# Patient Record
Sex: Female | Born: 1960 | Race: Black or African American | Hispanic: No | Marital: Married | Smoking: Former smoker
Health system: Southern US, Community
[De-identification: ages and names within clinical notes are randomized; demographics above are authoritative.]

## PROBLEM LIST (undated history)

## (undated) DIAGNOSIS — I10 Essential (primary) hypertension: Secondary | ICD-10-CM

## (undated) DIAGNOSIS — Z923 Personal history of irradiation: Secondary | ICD-10-CM

## (undated) DIAGNOSIS — Z860100 Personal history of colon polyps, unspecified: Secondary | ICD-10-CM

## (undated) DIAGNOSIS — E785 Hyperlipidemia, unspecified: Secondary | ICD-10-CM

## (undated) DIAGNOSIS — F1020 Alcohol dependence, uncomplicated: Secondary | ICD-10-CM

## (undated) DIAGNOSIS — M1990 Unspecified osteoarthritis, unspecified site: Secondary | ICD-10-CM

## (undated) DIAGNOSIS — H269 Unspecified cataract: Secondary | ICD-10-CM

## (undated) DIAGNOSIS — C801 Malignant (primary) neoplasm, unspecified: Secondary | ICD-10-CM

## (undated) DIAGNOSIS — K219 Gastro-esophageal reflux disease without esophagitis: Secondary | ICD-10-CM

## (undated) DIAGNOSIS — K449 Diaphragmatic hernia without obstruction or gangrene: Secondary | ICD-10-CM

## (undated) DIAGNOSIS — F419 Anxiety disorder, unspecified: Secondary | ICD-10-CM

## (undated) DIAGNOSIS — Z5189 Encounter for other specified aftercare: Secondary | ICD-10-CM

## (undated) HISTORY — DX: Essential (primary) hypertension: I10

## (undated) HISTORY — DX: Alcohol dependence, uncomplicated: F10.20

## (undated) HISTORY — DX: Malignant (primary) neoplasm, unspecified: C80.1

## (undated) HISTORY — DX: Encounter for other specified aftercare: Z51.89

## (undated) HISTORY — DX: Personal history of colon polyps, unspecified: Z86.0100

## (undated) HISTORY — DX: Unspecified cataract: H26.9

## (undated) HISTORY — PX: ABDOMINAL HYSTERECTOMY: SHX81

## (undated) HISTORY — DX: Personal history of colonic polyps: Z86.010

## (undated) HISTORY — PX: POLYPECTOMY: SHX149

## (undated) HISTORY — DX: Anxiety disorder, unspecified: F41.9

## (undated) HISTORY — PX: COLONOSCOPY: SHX174

## (undated) HISTORY — DX: Gastro-esophageal reflux disease without esophagitis: K21.9

## (undated) HISTORY — DX: Hyperlipidemia, unspecified: E78.5

## (undated) HISTORY — DX: Diaphragmatic hernia without obstruction or gangrene: K44.9

## (undated) HISTORY — DX: Unspecified osteoarthritis, unspecified site: M19.90

## (undated) HISTORY — DX: Personal history of irradiation: Z92.3

---

## 1993-10-26 HISTORY — PX: LAPAROSCOPIC HYSTERECTOMY: SHX1926

## 1998-05-01 ENCOUNTER — Ambulatory Visit (HOSPITAL_COMMUNITY): Admission: RE | Admit: 1998-05-01 | Discharge: 1998-05-01 | Payer: Self-pay | Admitting: Obstetrics and Gynecology

## 1998-05-02 ENCOUNTER — Ambulatory Visit (HOSPITAL_COMMUNITY): Admission: RE | Admit: 1998-05-02 | Discharge: 1998-05-02 | Payer: Self-pay | Admitting: Obstetrics and Gynecology

## 1999-01-29 ENCOUNTER — Other Ambulatory Visit: Admission: RE | Admit: 1999-01-29 | Discharge: 1999-01-29 | Payer: Self-pay | Admitting: Obstetrics and Gynecology

## 2000-03-11 ENCOUNTER — Encounter: Payer: Self-pay | Admitting: Obstetrics and Gynecology

## 2000-03-11 ENCOUNTER — Ambulatory Visit (HOSPITAL_COMMUNITY): Admission: RE | Admit: 2000-03-11 | Discharge: 2000-03-11 | Payer: Self-pay | Admitting: Obstetrics and Gynecology

## 2000-03-17 ENCOUNTER — Other Ambulatory Visit: Admission: RE | Admit: 2000-03-17 | Discharge: 2000-03-17 | Payer: Self-pay | Admitting: Obstetrics and Gynecology

## 2001-03-25 ENCOUNTER — Ambulatory Visit (HOSPITAL_COMMUNITY): Admission: RE | Admit: 2001-03-25 | Discharge: 2001-03-25 | Payer: Self-pay | Admitting: Obstetrics and Gynecology

## 2001-03-25 ENCOUNTER — Encounter: Payer: Self-pay | Admitting: Obstetrics and Gynecology

## 2001-05-23 ENCOUNTER — Other Ambulatory Visit: Admission: RE | Admit: 2001-05-23 | Discharge: 2001-05-23 | Payer: Self-pay | Admitting: Obstetrics and Gynecology

## 2001-11-06 ENCOUNTER — Emergency Department (HOSPITAL_COMMUNITY): Admission: EM | Admit: 2001-11-06 | Discharge: 2001-11-06 | Payer: Self-pay | Admitting: Emergency Medicine

## 2001-11-06 ENCOUNTER — Encounter: Payer: Self-pay | Admitting: Emergency Medicine

## 2001-11-22 ENCOUNTER — Encounter: Payer: Self-pay | Admitting: Internal Medicine

## 2001-11-22 ENCOUNTER — Encounter: Admission: RE | Admit: 2001-11-22 | Discharge: 2001-11-22 | Payer: Self-pay | Admitting: Internal Medicine

## 2002-12-20 ENCOUNTER — Other Ambulatory Visit: Admission: RE | Admit: 2002-12-20 | Discharge: 2002-12-20 | Payer: Self-pay | Admitting: Obstetrics and Gynecology

## 2002-12-20 ENCOUNTER — Ambulatory Visit (HOSPITAL_COMMUNITY): Admission: RE | Admit: 2002-12-20 | Discharge: 2002-12-20 | Payer: Self-pay | Admitting: Obstetrics and Gynecology

## 2002-12-20 ENCOUNTER — Encounter: Payer: Self-pay | Admitting: Obstetrics and Gynecology

## 2004-02-15 ENCOUNTER — Other Ambulatory Visit: Admission: RE | Admit: 2004-02-15 | Discharge: 2004-02-15 | Payer: Self-pay | Admitting: Obstetrics and Gynecology

## 2004-04-01 ENCOUNTER — Ambulatory Visit (HOSPITAL_COMMUNITY): Admission: RE | Admit: 2004-04-01 | Discharge: 2004-04-01 | Payer: Self-pay | Admitting: Obstetrics and Gynecology

## 2005-04-02 ENCOUNTER — Ambulatory Visit (HOSPITAL_COMMUNITY): Admission: RE | Admit: 2005-04-02 | Discharge: 2005-04-02 | Payer: Self-pay | Admitting: Obstetrics and Gynecology

## 2005-04-02 ENCOUNTER — Other Ambulatory Visit: Admission: RE | Admit: 2005-04-02 | Discharge: 2005-04-02 | Payer: Self-pay | Admitting: Obstetrics and Gynecology

## 2006-04-05 ENCOUNTER — Other Ambulatory Visit: Admission: RE | Admit: 2006-04-05 | Discharge: 2006-04-05 | Payer: Self-pay | Admitting: Obstetrics and Gynecology

## 2006-04-05 ENCOUNTER — Ambulatory Visit (HOSPITAL_COMMUNITY): Admission: RE | Admit: 2006-04-05 | Discharge: 2006-04-05 | Payer: Self-pay | Admitting: Obstetrics and Gynecology

## 2007-04-21 ENCOUNTER — Ambulatory Visit (HOSPITAL_COMMUNITY): Admission: RE | Admit: 2007-04-21 | Discharge: 2007-04-21 | Payer: Self-pay | Admitting: Obstetrics and Gynecology

## 2008-05-24 ENCOUNTER — Encounter: Admission: RE | Admit: 2008-05-24 | Discharge: 2008-05-24 | Payer: Self-pay | Admitting: Obstetrics and Gynecology

## 2010-01-17 ENCOUNTER — Ambulatory Visit: Payer: Self-pay | Admitting: Women's Health

## 2010-01-17 ENCOUNTER — Other Ambulatory Visit: Admission: RE | Admit: 2010-01-17 | Discharge: 2010-01-17 | Payer: Self-pay | Admitting: Gynecology

## 2010-01-17 ENCOUNTER — Encounter: Admission: RE | Admit: 2010-01-17 | Discharge: 2010-01-17 | Payer: Self-pay | Admitting: Gynecology

## 2010-09-07 ENCOUNTER — Encounter: Payer: Self-pay | Admitting: Family Medicine

## 2010-09-07 ENCOUNTER — Ambulatory Visit: Payer: Self-pay | Admitting: Surgery

## 2010-09-07 ENCOUNTER — Ambulatory Visit (HOSPITAL_COMMUNITY): Admission: RE | Admit: 2010-09-07 | Discharge: 2010-09-07 | Payer: Self-pay | Admitting: Family Medicine

## 2010-11-07 ENCOUNTER — Ambulatory Visit
Admission: RE | Admit: 2010-11-07 | Discharge: 2010-11-07 | Payer: Self-pay | Source: Home / Self Care | Attending: Family Medicine | Admitting: Family Medicine

## 2010-11-07 ENCOUNTER — Encounter: Payer: Self-pay | Admitting: Family Medicine

## 2010-11-07 DIAGNOSIS — F411 Generalized anxiety disorder: Secondary | ICD-10-CM | POA: Insufficient documentation

## 2010-11-07 DIAGNOSIS — F329 Major depressive disorder, single episode, unspecified: Secondary | ICD-10-CM | POA: Insufficient documentation

## 2010-11-07 DIAGNOSIS — R079 Chest pain, unspecified: Secondary | ICD-10-CM | POA: Insufficient documentation

## 2010-11-07 DIAGNOSIS — K219 Gastro-esophageal reflux disease without esophagitis: Secondary | ICD-10-CM | POA: Insufficient documentation

## 2010-11-07 DIAGNOSIS — F988 Other specified behavioral and emotional disorders with onset usually occurring in childhood and adolescence: Secondary | ICD-10-CM | POA: Insufficient documentation

## 2010-11-10 ENCOUNTER — Encounter: Payer: Self-pay | Admitting: Gastroenterology

## 2010-11-16 ENCOUNTER — Encounter: Payer: Self-pay | Admitting: Obstetrics and Gynecology

## 2010-11-27 NOTE — Letter (Signed)
Summary: New Patient letter  [facility]  [address] [place], [place] [address]   Phone: [phone]  Fax: [phone]       11/10/2010 MRN: [id number]  April Robinson [address] [place], [place]  [address]  Dear April Robinson,  Welcome to the Gastroenterology Division at [facility].    You are scheduled to see Dr.  Jarold Motto on 12-16-10 at 10am on the 3rd floor at [facility], [address]  We ask that you try to arrive at our office 15 minutes prior to your appointment time to allow for check-in.  We would like you to complete the enclosed self-administered evaluation form prior to your visit and bring it with you on the day of your appointment.  We will review it with you.  Also, please bring a complete list of all your medications or, if you prefer, bring the medication bottles and we will list them.  Please bring your insurance card so that we may make a copy of it.  If your insurance requires a referral to see a specialist, please bring your referral form from your primary care physician.  Co-payments are due at the time of your visit and may be paid by cash, check or credit card.     Your office visit will consist of a consult with your physician (includes a physical exam), any laboratory testing he/she may order, scheduling of any necessary diagnostic testing (e.g. x-ray, ultrasound, CT-scan), and scheduling of a procedure (e.g. Endoscopy, Colonoscopy) if required.  Please allow enough time on your schedule to allow for any/all of these possibilities.    If you cannot keep your appointment, please call [phone] to cancel or reschedule prior to your appointment date.  This allows [place] the opportunity to schedule an appointment for another patient in need of care.  If you do not cancel or reschedule by 5 p.m. the business day prior to your appointment date, you will be charged a $50.00 late cancellation/no-show fee.    Thank you for  choosing [facility] for your medical needs.  We appreciate the opportunity to care for you.  Please visit [place] at our website  to learn more about our practice.                     Sincerely,                                                             The Gastroenterology Division

## 2010-11-27 NOTE — Assessment & Plan Note (Signed)
Summary: NEW TO ESTABLISH//PH   Vital Signs:  Patient profile:   50 year old female Menstrual status:  hysterectomy Height:      64 inches Weight:      191.0 pounds BMI:     32.90 Pulse rate:   88 / minute Pulse rhythm:   regular BP sitting:   132 / 82  (left arm) Cuff size:   large  Vitals Entered By: [name] CMA Duncan Dull) (November 07, 2010 1:54 PM) CC: NEW EST--c/o severe acid reflux, trouble focusing, and depression     Menstrual Status hysterectomy   History of Present Illness: Pt here to establish.  She is complaining about increasing reflux symptoms.  She is taking prilosec  two times a day with little relief.  Pt saw GI several years ago ---no EGD done only barium study done.   No chest pain. Burning sensation in upper abd--to esophagus.  Pt has also tried omeprazole and prevacid with no relief.     Pt was dx with depression about 1 1/2 years ago.  She was on citalopram but stopped it about 4 months ago and feels like she is fine now but pt feels like she is "mentally not sharp".  She has trouble focusing.  She had minor issues with this as a child.     Preventive Screening-Counseling & Management  Alcohol-Tobacco     Smoking Status: quit     Packs/Day: 0.5  Caffeine-Diet-Exercise     Does Patient Exercise: no      Drug Use:  no.    Current Medications (verified): 1)  Femring 0.05 Mg/24hr Ring (Estradiol Acetate) .... Insert Pv As Directed 2)  Valtrex 500 Mg Tabs (Valacyclovir Hcl) .... By Mouth As Needed 3)  Dexilant 60 Mg Cpdr (Dexlansoprazole) .Marland Kitchen.. 1 By Mouth Once Daily 4)  Ativan 1 Mg Tabs (Lorazepam) .Marland Kitchen.. 1 By Mouth Three Times A Day As Needed 5)  Ritalin 10 Mg Tabs (Methylphenidate Hcl) .Marland Kitchen.. 1 By Mouth Two Times A Day  Allergies (verified): No Known Drug Allergies  Past History:  Family History: Last updated: 11/07/2010 Family History of Alcoholism/Addiction Family History of Arthritis Family History Breast cancer 1st degree relative  <50--Mother Family History Diabetes 1st degree relative Family History High cholesterol Family History Hypertension Family History of Stroke M 1st degree relative <50 Family History of Cardiovascular disorder  Social History: Last updated: 11/07/2010 Occupation: Married Former Smoker Alcohol use-yes Drug use-no Regular exercise-no  Risk Factors: Exercise: no (11/07/2010)  Risk Factors: Smoking Status: quit (11/07/2010) Packs/Day: 0.5 (11/07/2010)  Past Medical History: Depression GERD Shingles  Past Surgical History: Hysterectomy--1995  Family History: Reviewed history and no changes required. Family History of Alcoholism/Addiction Family History of Arthritis Family History Breast cancer 1st degree relative <50--Mother Family History Diabetes 1st degree relative Family History High cholesterol Family History Hypertension Family History of Stroke M 1st degree relative <50 Family History of Cardiovascular disorder  Social History: Reviewed history and no changes required. Occupation: Married Former Smoker Alcohol use-yes Drug use-no Regular exercise-no Occupation:  employed Smoking Status:  quit Drug Use:  no Does Patient Exercise:  no Packs/Day:  0.5  Review of Systems      See HPI  Physical Exam  General:  Well-developed,well-nourished,in no acute distress; alert,appropriate and cooperative throughout examination Neck:  No deformities, masses, or tenderness noted. Lungs:  Normal respiratory effort, chest expands symmetrically. Lungs are clear to auscultation, no crackles or wheezes. Heart:  normal rate and no murmur.   Abdomen:  Bowel sounds positive,abdomen soft and non-tender without masses, organomegaly or hernias noted. Psych:  Oriented X3, normally interactive, and slightly anxious.     Impression & Recommendations:  Problem # 1:  GERD (ICD-530.81)  Her updated medication list for this problem includes:    Dexilant 60 Mg Cpdr  (Dexlansoprazole) .Marland Kitchen... 1 by mouth once daily  Orders: Gastroenterology Referral (GI)  Diagnostics Reviewed:  Discussed lifestyle modifications, diet, antacids/medications, and preventive measures.  Problem # 2:  ADD (ICD-314.00) ritalin 10 mg two times a day  rto 1 month or sooner as needed   Problem # 3:  ANXIETY STATE, UNSPECIFIED (ICD-300.00)  Her updated medication list for this problem includes:    Ativan 1 Mg Tabs (Lorazepam) .Marland Kitchen... 1 by mouth three times a day as needed  Discussed medication use and relaxation techniques.   Complete Medication List: 1)  Femring 0.05 Mg/24hr Ring (Estradiol acetate) .... Insert pv as directed 2)  Valtrex 500 Mg Tabs (Valacyclovir hcl) .... By mouth as needed 3)  Dexilant 60 Mg Cpdr (Dexlansoprazole) .Marland Kitchen.. 1 by mouth once daily 4)  Ativan 1 Mg Tabs (Lorazepam) .Marland Kitchen.. 1 by mouth three times a day as needed 5)  Ritalin 10 Mg Tabs (Methylphenidate hcl) .Marland Kitchen.. 1 by mouth two times a day  Other Orders: EKG w/ Interpretation (93000)  Patient Instructions: 1)  Please schedule a follow-up appointment in 1 month.  Prescriptions: RITALIN 10 MG TABS (METHYLPHENIDATE HCL) 1 by mouth two times a day  #60 x 0   Entered and Authorized by:   Loreen Freud DO   Signed by:   Loreen Freud DO on 11/07/2010   Method used:   Print then Give to Patient   RxID:   [id number] DEXILANT 60 MG CPDR (DEXLANSOPRAZOLE) 1 by mouth once daily  #30 x 5   Entered and Authorized by:   Loreen Freud DO   Signed by:   Loreen Freud DO on 11/07/2010   Method used:   [facility] to        [facility]  [address] [id number]       [address]       [place], [place]  [address]       Ph: [phone] or [phone]       Fax: [phone]   RxID:   [id number]    Orders Added: 1)  Gastroenterology Referral [GI] 2)  New Patient Level III [[address]] 3)  EKG w/ Interpretation [93000]   Immunization History:  Tetanus/Td Immunization History:     Tetanus/Td:  historical (08/26/2009)   Immunization History:  Tetanus/Td Immunization History:    Tetanus/Td:  Historical (08/26/2009)

## 2010-11-27 NOTE — Letter (Signed)
Summary: ADHD Questionnaire  ADHD Questionnaire   Imported By: Lanelle Bal 11/18/2010 13:09:48  _____________________________________________________________________  External Attachment:    Type:   Image     Comment:   External Document

## 2010-12-03 ENCOUNTER — Ambulatory Visit (INDEPENDENT_AMBULATORY_CARE_PROVIDER_SITE_OTHER): Payer: Managed Care, Other (non HMO) | Admitting: Family Medicine

## 2010-12-03 ENCOUNTER — Encounter: Payer: Self-pay | Admitting: Family Medicine

## 2010-12-03 DIAGNOSIS — K219 Gastro-esophageal reflux disease without esophagitis: Secondary | ICD-10-CM

## 2010-12-03 DIAGNOSIS — R030 Elevated blood-pressure reading, without diagnosis of hypertension: Secondary | ICD-10-CM | POA: Insufficient documentation

## 2010-12-03 DIAGNOSIS — F411 Generalized anxiety disorder: Secondary | ICD-10-CM

## 2010-12-03 DIAGNOSIS — S139XXA Sprain of joints and ligaments of unspecified parts of neck, initial encounter: Secondary | ICD-10-CM

## 2010-12-03 DIAGNOSIS — K589 Irritable bowel syndrome without diarrhea: Secondary | ICD-10-CM | POA: Insufficient documentation

## 2010-12-04 ENCOUNTER — Telehealth: Payer: Self-pay | Admitting: Family Medicine

## 2010-12-05 ENCOUNTER — Ambulatory Visit: Payer: Self-pay | Admitting: Family Medicine

## 2010-12-11 NOTE — Progress Notes (Signed)
Summary: new rx (lmom 2/9)   Phone Note Call from Patient Call back at Work Phone [phone]   Summary of Call: patient was to check  name of muscle relaxer she took  - she thought she has some at home that she was given at an urgent care but doesnt she is not sure the name -wants rx called in  [facility] [address] [facility] Initial call taken by: Okey Regal [place],  December 04, 2010 8:39 AM  Follow-up for Phone Call        Pt check and she does not have any meds at home would you like to Rx anything for her.............Marland KitchenFelecia Deloach CMA  December 04, 2010 10:51 AM   Additional Follow-up for Phone Call Additional follow up Details #1::        flexeril 10 mg #30 1 by mouth three times a day as needed  Additional Follow-up by: Loreen Freud DO,  December 04, 2010 11:50 AM    Additional Follow-up for Phone Call Additional follow up Details #2::    Left message for pt to call back, letting her new med was sent in.  Follow-up by: Army Fossa CMA,  December 04, 2010 3:10 PM  New/Updated Medications: FLEXERIL 10 MG TABS (CYCLOBENZAPRINE HCL) 1 by mouth three times a day as needed FLEXERIL 10 MG TABS (CYCLOBENZAPRINE HCL) 1 by mouth three times a day as needed Prescriptions: FLEXERIL 10 MG TABS (CYCLOBENZAPRINE HCL) 1 by mouth three times a day as needed  #30 x 0   Entered by:   [name] CMA (AAMA)   Authorized by:   Loreen Freud DO   Signed by:   [name] CMA (AAMA) on 12/04/2010   Method used:   [facility] to ...       [id number]       [address]       [place], [place]  [address]       Ph: [phone] or [phone]       Fax: [phone]   RxID:   [id number] FLEXERIL 10 MG TABS (CYCLOBENZAPRINE HCL) 1 by mouth three times a day as needed  #30 x 0   Entered by:   Army Fossa CMA   Authorized by:   Loreen Freud DO   Signed by:   Army Fossa CMA on 12/04/2010   Method used:   [facility]        [facility]   [address] [id number]       [address]       [place], [place]  [address]       Ph: [phone] or [phone]       Fax: [phone]   RxID:   [id number]

## 2010-12-11 NOTE — Assessment & Plan Note (Signed)
Summary: discomfort in neck and head, diarrhea//fd   Vital Signs:  Patient profile:   50 year old female Menstrual status:  hysterectomy Height:      64 inches Weight:      186 pounds BMI:     32.04 Temp:     97.8 degrees F oral Pulse rate:   82 / minute BP sitting:   138 / 98  (left arm)  Vitals Entered By: Jeremy Johann CMA (December 03, 2010 11:45 AM) CC: pain in neck and shoulder, discomfort in stomach, nausea, diarrhea   History of Present Illness: Pt here c/o headaches in R side head and shoulder x1 day and loose stools today ---3 x already today.   No fever.    Current Medications (verified): 1)  Femring 0.05 Mg/24hr Ring (Estradiol Acetate) .... Insert Pv As Directed 2)  Valtrex 500 Mg Tabs (Valacyclovir Hcl) .... By Mouth As Needed 3)  Dexilant 60 Mg Cpdr (Dexlansoprazole) .Marland Kitchen.. 1 By Mouth Once Daily 4)  Ativan 1 Mg Tabs (Lorazepam) .Marland Kitchen.. 1 By Mouth Three Times A Day As Needed 5)  Ritalin 10 Mg Tabs (Methylphenidate Hcl) .Marland Kitchen.. 1 By Mouth Two Times A Day 6)  Levsin/sl 0.125 Mg Subl (Hyoscyamine Sulfate) .Marland Kitchen.. 1-2 Sl Q4h As Needed  Allergies (verified): No Known Drug Allergies  Past History:  Past medical, surgical, family and social histories (including risk factors) reviewed for relevance to current acute and chronic problems.  Past Medical History: Reviewed history from 11/07/2010 and no changes required. Depression GERD Shingles  Past Surgical History: Reviewed history from 11/07/2010 and no changes required. Hysterectomy--1995  Family History: Reviewed history from 11/07/2010 and no changes required. Family History of Alcoholism/Addiction Family History of Arthritis Family History Breast cancer 1st degree relative <50--Mother Family History Diabetes 1st degree relative Family History High cholesterol Family History Hypertension Family History of Stroke M 1st degree relative <50 Family History of Cardiovascular disorder  Social History: Reviewed  history from 11/07/2010 and no changes required. Occupation: Married Former Smoker Alcohol use-yes Drug use-no Regular exercise-no  Review of Systems      See HPI  Physical Exam  General:  Well-developed,well-nourished,in no acute distress; alert,appropriate and cooperative throughout examination Ears:  External ear exam shows no significant lesions or deformities.  Otoscopic examination reveals clear canals, tympanic membranes are intact bilaterally without bulging, retraction, inflammation or discharge. Hearing is grossly normal bilaterally. Neck:  No deformities, masses, or tenderness noted.supple.   Lungs:  Normal respiratory effort, chest expands symmetrically. Lungs are clear to auscultation, no crackles or wheezes. Msk:  No deformity or scoliosis noted of thoracic or lumbar spine.   Extremities:  No clubbing, cyanosis, edema, or deformity noted with normal full range of motion of all joints.   Neurologic:  No cranial nerve deficits noted. Station and gait are normal. Plantar reflexes are down-going bilaterally. DTRs are symmetrical throughout. Sensory, motor and coordinative functions appear intact. Skin:  Intact without suspicious lesions or rashes Cervical Nodes:  No lymphadenopathy noted Psych:  Cognition and judgment appear intact. Alert and cooperative with normal attention span and concentration. No apparent delusions, illusions, hallucinations   Impression & Recommendations:  Problem # 1:  IBS (ICD-564.1) levsin as needed  f/u gi   Problem # 2:  GERD (ICD-530.81) Assessment: Improved  Her updated medication list for this problem includes:    Dexilant 60 Mg Cpdr (Dexlansoprazole) .Marland Kitchen... 1 by mouth once daily    Levsin/sl 0.125 Mg Subl (Hyoscyamine sulfate) .Marland Kitchen... 1-2 sl q4h as  needed  Diagnostics Reviewed:  Discussed lifestyle modifications, diet, antacids/medications, and preventive measures. Handout provided.   Problem # 3:  NECK SPRAIN AND STRAIN  (ICD-847.0)  Pt has muscle relaxer at home heat stretching chiropractor  Discussed exercises and use of moist heat or cold and medication.   Problem # 4:  ELEVATED BP READING WITHOUT DX HYPERTENSION (ICD-796.2) recheck 2 weeks BP today: 138/98 Prior BP: 132/82 (11/07/2010)  Instructed in low sodium diet (DASH Handout) and behavior modification.    Complete Medication List: 1)  Femring 0.05 Mg/24hr Ring (Estradiol acetate) .... Insert pv as directed 2)  Valtrex 500 Mg Tabs (Valacyclovir hcl) .... By mouth as needed 3)  Dexilant 60 Mg Cpdr (Dexlansoprazole) .Marland Kitchen.. 1 by mouth once daily 4)  Ativan 1 Mg Tabs (Lorazepam) .Marland Kitchen.. 1 by mouth three times a day as needed 5)  Ritalin 10 Mg Tabs (Methylphenidate hcl) .Marland Kitchen.. 1 by mouth two times a day 6)  Levsin/sl 0.125 Mg Subl (Hyoscyamine sulfate) .Marland Kitchen.. 1-2 sl q4h as needed Prescriptions: LEVSIN/SL 0.125 MG SUBL (HYOSCYAMINE SULFATE) 1-2 sl q4h as needed  #30 x 0   Entered and Authorized by:   Loreen Freud DO   Signed by:   Loreen Freud DO on 12/03/2010   Method used:   [facility]  [address] [id number]       [address]       [place], [place]  [address]       Ph: [phone] or [phone]       Fax: [phone]   RxID:   [id number]    Orders Added: 1)  Est. Patient Level III [[address]]

## 2010-12-16 ENCOUNTER — Other Ambulatory Visit: Payer: Managed Care, Other (non HMO)

## 2010-12-16 ENCOUNTER — Other Ambulatory Visit: Payer: Self-pay | Admitting: Gastroenterology

## 2010-12-16 ENCOUNTER — Encounter (INDEPENDENT_AMBULATORY_CARE_PROVIDER_SITE_OTHER): Payer: Self-pay | Admitting: *Deleted

## 2010-12-16 ENCOUNTER — Encounter: Payer: Self-pay | Admitting: Gastroenterology

## 2010-12-16 ENCOUNTER — Ambulatory Visit (INDEPENDENT_AMBULATORY_CARE_PROVIDER_SITE_OTHER): Payer: Managed Care, Other (non HMO) | Admitting: Gastroenterology

## 2010-12-16 ENCOUNTER — Ambulatory Visit (HOSPITAL_COMMUNITY)
Admission: RE | Admit: 2010-12-16 | Discharge: 2010-12-16 | Disposition: A | Discharge status: Home / Self Care | Payer: Managed Care, Other (non HMO) | Source: Ambulatory Visit | Attending: Gastroenterology | Admitting: Gastroenterology

## 2010-12-16 DIAGNOSIS — R030 Elevated blood-pressure reading, without diagnosis of hypertension: Secondary | ICD-10-CM

## 2010-12-16 DIAGNOSIS — R1012 Left upper quadrant pain: Secondary | ICD-10-CM

## 2010-12-16 DIAGNOSIS — R52 Pain, unspecified: Secondary | ICD-10-CM

## 2010-12-16 DIAGNOSIS — K219 Gastro-esophageal reflux disease without esophagitis: Secondary | ICD-10-CM

## 2010-12-16 DIAGNOSIS — K824 Cholesterolosis of gallbladder: Secondary | ICD-10-CM | POA: Insufficient documentation

## 2010-12-16 DIAGNOSIS — K589 Irritable bowel syndrome without diarrhea: Secondary | ICD-10-CM

## 2010-12-16 LAB — HEPATIC FUNCTION PANEL: Alkaline Phosphatase: 47 U/L (ref 39–117)

## 2010-12-16 LAB — BASIC METABOLIC PANEL
CO2: 30 mEq/L (ref 19–32)
Chloride: 106 mEq/L (ref 96–112)
Glucose, Bld: 87 mg/dL (ref 70–99)
Potassium: 4.2 mEq/L (ref 3.5–5.1)
Sodium: 141 mEq/L (ref 135–145)

## 2010-12-16 LAB — CBC WITH DIFFERENTIAL/PLATELET
Basophils Absolute: 0 10*3/uL (ref 0.0–0.1)
Basophils Relative: 0.4 % (ref 0.0–3.0)
Eosinophils Absolute: 0 10*3/uL (ref 0.0–0.7)
HCT: 43.3 % (ref 36.0–46.0)
Hemoglobin: 14.7 g/dL (ref 12.0–15.0)
Lymphs Abs: 2.2 10*3/uL (ref 0.7–4.0)
Monocytes Absolute: 0.3 10*3/uL (ref 0.1–1.0)
Monocytes Relative: 6.5 % (ref 3.0–12.0)
Neutro Abs: 2.6 10*3/uL (ref 1.4–7.7)
Platelets: 236 10*3/uL (ref 150.0–400.0)
RBC: 4.96 Mil/uL (ref 3.87–5.11)

## 2010-12-16 LAB — FERRITIN: Ferritin: 97.1 ng/mL (ref 10.0–291.0)

## 2010-12-16 LAB — IBC PANEL
Saturation Ratios: 19.5 % — ABNORMAL LOW (ref 20.0–50.0)
Transferrin: 212.6 mg/dL (ref 212.0–360.0)

## 2010-12-16 LAB — VITAMIN B12: Vitamin B-12: 292 pg/mL (ref 211–911)

## 2010-12-16 LAB — FOLATE: Folate: 8.3 ng/mL (ref >5.9)

## 2010-12-18 ENCOUNTER — Other Ambulatory Visit (HOSPITAL_COMMUNITY): Payer: Managed Care, Other (non HMO)

## 2010-12-23 NOTE — Letter (Signed)
Summary: [facility] Instructions  [facility]  [address] [place], [place] [address]   Phone: [phone]  Fax: [phone]       April Robinson    01-Jan-1961    MRN: [id number]        Procedure Day /Date: Friday 12/26/2010     Arrival Time: 12:30pm     Procedure Time: 1:30pm     Location of Procedure:                    [facility] (4th Floor)   PREPARATION FOR COLONOSCOPY WITH MOVIPREP   Starting 5 days prior to your procedure 12/21/2010 do not eat nuts, seeds, popcorn, corn, beans, peas,  salads, or any raw vegetables.  Do not take any fiber supplements (e.g. Metamucil, Citrucel, and Benefiber).  THE DAY BEFORE YOUR PROCEDURE         Thursday 12/25/2010  1.  Drink clear liquids the entire day-NO SOLID FOOD  2.  Do not drink anything colored red or purple.  Avoid juices with pulp.  No orange juice.  3.  Drink at least 64 oz. (8 glasses) of fluid/clear liquids during the day to prevent dehydration and help the prep work efficiently.  CLEAR LIQUIDS INCLUDE: Water Jello Ice Popsicles Tea (sugar ok, no milk/cream) Powdered fruit flavored drinks Coffee (sugar ok, no milk/cream) Gatorade Juice: apple, white grape, white cranberry  Lemonade Clear bullion, consomm, broth Carbonated beverages (any kind) Strained chicken noodle soup Hard Candy                             4.  In the morning, mix first dose of MoviPrep solution:    Empty 1 Pouch A and 1 Pouch B into the disposable container    Add lukewarm drinking water to the top line of the container. Mix to dissolve    Refrigerate (mixed solution should be used within 24 hrs)  5.  Begin drinking the prep at 5:00 p.m. The MoviPrep container is divided by 4 marks.   Every 15 minutes drink the solution down to the next mark (approximately 8 oz) until the full liter is complete.   6.  Follow completed prep with 16 oz of clear liquid of your choice (Nothing red or purple).  Continue  to drink clear liquids until bedtime.  7.  Before going to bed, mix second dose of MoviPrep solution:    Empty 1 Pouch A and 1 Pouch B into the disposable container    Add lukewarm drinking water to the top line of the container. Mix to dissolve    Refrigerate  THE DAY OF YOUR PROCEDURE      Friday 12/26/2010  Beginning at 8:30am (5 hours before procedure):         1. Every 15 minutes, drink the solution down to the next mark (approx 8 oz) until the full liter is complete.  2. Follow completed prep with 16 oz. of clear liquid of your choice.    3. You may drink clear liquids until 11:30am (2 HOURS BEFORE PROCEDURE).   MEDICATION INSTRUCTIONS  Unless otherwise instructed, you should take regular prescription medications with a small sip of water   as early as possible the morning of your procedure.        OTHER INSTRUCTIONS  You will need a responsible adult at least 51 years of age to accompany you and drive you home.  This person must remain in the waiting room during your procedure.  Wear loose fitting clothing that is easily removed.  Leave jewelry and other valuables at home.  However, you may wish to bring a book to read or  an iPod/MP3 player to listen to music as you wait for your procedure to start.  Remove all body piercing jewelry and leave at home.  Total time from sign-in until discharge is approximately 2-3 hours.  You should go home directly after your procedure and rest.  You can resume normal activities the  day after your procedure.  The day of your procedure you should not:   Drive   Make legal decisions   Operate machinery   Drink alcohol   Return to work  You will receive specific instructions about eating, activities and medications before you leave.    The above instructions have been reviewed and explained to me by   _______________________    I fully understand and can verbalize these instructions _____________________________  Date _________

## 2010-12-23 NOTE — Assessment & Plan Note (Signed)
Summary: GERD..YF  GI HX YEARS AGO UNSURE WHERE/SCH W/RENEE CIGNA//MAI...   History of Present Illness Visit Type: Initial Consult Primary GI MD: Sheryn Bison MD FACP FAGA Primary Provider: Janit Bern Requesting Provider: Janit Bern Chief Complaint: GERD  and abdominal pain History of Present Illness:   Very Pleasant 50 year old African American female he has had one year of left upper quadrant discomfort just been persistent despite PPI therapy for rather severe regurgitation, burning substernal chest pain, and intermittent nausea without emesis. She fell at an upper GI series years ago which was unremarkable.I do not have that report for review.  She denies alcohol, cigarette, or NSAID use. She currently is on Dexilant 60 mg a day, and has had good improvement in her reflux symptomatology. She denies any specific hepatobiliary complaints. She has noticed that her left upper quadrant pain is worse with an empty stomach. There is 8 pound weight loss over the last year. Her history is positive for bloating, mild constipation, but no rectal bleeding.  Her brother age 69 had a neuroendocrine tumor of his gastroesophageal junction, and apparently died from complications. 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 She denies fever, chills, arthritis, skin rashes, or oral stomatitis.   GI Review of Systems    Reports abdominal pain, acid reflux, belching, bloating, and  heartburn.      Denies chest pain, dysphagia with liquids, dysphagia with solids, loss of appetite, nausea, vomiting, vomiting blood, weight loss, and  weight gain.      Reports irritable bowel syndrome.     Denies anal fissure, black tarry stools, change in bowel habit, constipation, diarrhea, diverticulosis, fecal incontinence, heme positive stool, hemorrhoids, jaundice, light color stool, liver problems, rectal bleeding, and  rectal pain.    Current Medications (verified): 1)  Femring 0.05 Mg/24hr Ring  (Estradiol Acetate) .... Insert Pv As Directed 2)  Valtrex 500 Mg Tabs (Valacyclovir Hcl) .... By Mouth As Needed 3)  Dexilant 60 Mg Cpdr (Dexlansoprazole) .Marland Kitchen.. 1 By Mouth Once Daily 4)  Ativan 1 Mg Tabs (Lorazepam) .Marland Kitchen.. 1 By Mouth Three Times A Day As Needed 5)  Levsin/sl 0.125 Mg Subl (Hyoscyamine Sulfate) .Marland Kitchen.. 1-2 Sl Q4h As Needed 6)  Flexeril 10 Mg Tabs (Cyclobenzaprine Hcl) .Marland Kitchen.. 1 By Mouth Three Times A Day As Needed  Allergies (verified): No Known Drug Allergies  Past History:  Past medical, surgical, family and social histories (including risk factors) reviewed for relevance to current acute and chronic problems.  Past Medical History: Depression GERD Shingles Alcoholism Anxiety Disorder Arthritis Chronic Headaches Hx of colon polyps Neuroendocrine cancer Hyperlipidemia Hypertension Hx of stroke  Past Surgical History: Reviewed history from 11/07/2010 and no changes required. Hysterectomy--1995  Family History: Reviewed history from 11/07/2010 and no changes required. Family History of Alcoholism/Addiction Family History of Arthritis Family History Breast cancer 1st degree relative <50--Mother Family History Diabetes 1st degree relative Family History High cholesterol Family History Hypertension Family History of Stroke M 1st degree relative <50 Family History of Cardiovascular disorder  Social History: Reviewed history from 11/07/2010 and no changes required. Occupation:customer service Married Former Smoker Drug use-no Regular exercise-no 3 children  Review of Systems       The patient complains of anxiety-new, depression-new, fatigue, headaches-new, and vision changes.  The patient denies allergy/sinus, anemia, arthritis/joint pain, back pain, blood in urine, breast changes/lumps, change in vision, confusion, cough, coughing up blood, fainting, fever, hearing problems, heart murmur, heart rhythm changes, itching, menstrual pain, muscle pains/cramps,  night sweats, nosebleeds,  pregnancy symptoms, shortness of breath, skin rash, sleeping problems, sore throat, swelling of feet/legs, swollen lymph glands, thirst - excessive , urination - excessive , urination changes/pain, urine leakage, and voice change.    Vital Signs:  Patient profile:   50 year old female Menstrual status:  hysterectomy Height:      64 inches Weight:      184 pounds BMI:     31.70 BSA:     1.89 Pulse rate:   86 / minute Pulse rhythm:   regular BP sitting:   128 / 98  (left arm)  Vitals Entered By: Merri Ray CMA Duncan Dull) (December 16, 2010 10:07 AM)  Physical Exam  General:  Well developed, well nourished, no acute distress.healthy appearing.   Head:  Normocephalic and atraumatic. Eyes:  PERRLA, no icterus.exam deferred to patient's ophthalmologist.   Neck:  Supple; no masses or thyromegaly. Lungs:  Clear throughout to auscultation. Heart:  Regular rate and rhythm; no murmurs, rubs,  or bruits. Abdomen:  Soft, nontender and nondistended. No masses, hepatosplenomegaly or hernias noted. Normal bowel sounds.Slight tenderness to left upper quadrant area without a definite mass. Rectal:  deferred until time of colonoscopy.   Msk:  Symmetrical with no gross deformities. Normal posture. Pulses:  Normal pulses noted. Extremities:  No clubbing, cyanosis, edema or deformities noted. Neurologic:  Alert and  oriented x4;  grossly normal neurologically. Cervical Nodes:  No significant cervical adenopathy. Psych:  Alert and cooperative. Normal mood and affect.   Impression & Recommendations:  Problem # 1:  ABDOMINAL PAIN, LEFT UPPER QUADRANT (ICD-789.02) Assessment Unchanged Worrisome family history of similar problems in her brother who had a neuroendocrine tumor of his gut. I scheduled this patient for upper abdominal ultrasound exam, lab review, an endoscopic exam. She is to continue Dexilant 60 mg a day for acid reflux, and I also have added p.r.n. Carafate  suspension a.c. and q.h.s. Orders: TLB-CBC Platelet - w/Differential (85025-CBCD) TLB-BMP (Basic Metabolic Panel-BMET) (80048-METABOL) TLB-Hepatic/Liver Function Pnl (80076-HEPATIC) TLB-TSH (Thyroid Stimulating Hormone) (84443-TSH) TLB-B12, Serum-Total ONLY ([address]-U04) TLB-Ferritin (82728-FER) TLB-Folic Acid (Folate) (82746-FOL) TLB-IBC Pnl (Iron/FE;Transferrin) (83550-IBC) TLB-IgA (Immunoglobulin A) (82784-IGA) T-Sprue Panel (Celiac Disease Aby Eval) (83516x3/[address]) Ultrasound Abdomen (UAS) Colon/Endo (Colon/Endo)  Problem # 2:  IBS (ICD-564.1) Assessment: Improved Colonoscopy  screening procedure  per her age.  Problem # 3:  ELEVATED BP READING WITHOUT DX HYPERTENSION (ICD-796.2) Assessment: Unchanged blood pressure today 128/98. Continued followup with primary care as planned.  Patient Instructions: 1)  Copy sent to : Myrene Buddy Lowne,DO 2)  Your procedure has been scheduled for 12/26/2010, please follow the seperate instructions.  3)  [facility] Patient Information Guide given to patient.  4)  Colonoscopy and Flexible Sigmoidoscopy brochure given.  5)  Upper Endoscopy brochure given.  6)  Your abdominal ultrasound is schedule for today please follow the seperate instructions.  7)  Please go to the basement today for your labs.  8)  Your prescription(s) have been sent to you pharmacy.  9)  The medication list was reviewed and reconciled.  All changed / newly prescribed medications were explained.  A complete medication list was provided to the patient / caregiver. Prescriptions: MOVIPREP 100 GM  SOLR (PEG-KCL-NACL-NASULF-NA ASC-C) As per prep instructions.  #1 x 0   Entered by:   Harlow Mares CMA (AAMA)   Authorized by:   Mardella Layman MD [facility]   Signed by:   Harlow Mares CMA (AAMA) on 12/16/2010   Method used:   Electronically to  [facility]  [address] [id number] ([facility])       [address]       [place], [place]  [address]        Ph: [phone] or [phone]       Fax: [phone]   RxID:   [id number]

## 2010-12-26 ENCOUNTER — Other Ambulatory Visit: Payer: Self-pay | Admitting: Gastroenterology

## 2010-12-26 ENCOUNTER — Encounter (AMBULATORY_SURGERY_CENTER): Payer: Managed Care, Other (non HMO) | Admitting: Gastroenterology

## 2010-12-26 DIAGNOSIS — D131 Benign neoplasm of stomach: Secondary | ICD-10-CM

## 2010-12-26 DIAGNOSIS — D126 Benign neoplasm of colon, unspecified: Secondary | ICD-10-CM

## 2010-12-26 DIAGNOSIS — K219 Gastro-esophageal reflux disease without esophagitis: Secondary | ICD-10-CM

## 2010-12-26 DIAGNOSIS — R1012 Left upper quadrant pain: Secondary | ICD-10-CM

## 2010-12-26 DIAGNOSIS — K449 Diaphragmatic hernia without obstruction or gangrene: Secondary | ICD-10-CM

## 2010-12-26 DIAGNOSIS — Z1211 Encounter for screening for malignant neoplasm of colon: Secondary | ICD-10-CM

## 2010-12-29 ENCOUNTER — Encounter (INDEPENDENT_AMBULATORY_CARE_PROVIDER_SITE_OTHER): Payer: Self-pay | Admitting: *Deleted

## 2010-12-30 ENCOUNTER — Encounter: Payer: Self-pay | Admitting: Gastroenterology

## 2010-12-31 ENCOUNTER — Encounter: Payer: Self-pay | Admitting: Gastroenterology

## 2011-01-01 ENCOUNTER — Telehealth (INDEPENDENT_AMBULATORY_CARE_PROVIDER_SITE_OTHER): Payer: Self-pay | Admitting: *Deleted

## 2011-01-01 NOTE — Procedures (Addendum)
Summary: Colonoscopy  Patient: April Robinson Note: All result statuses are Final unless otherwise noted.  Tests: (1) Colonoscopy (COL)   COL Colonoscopy           DONE     [facility]     [address]     [place], [place]  [address]          COLONOSCOPY PROCEDURE REPORT          PATIENT:  [name], [name]  MR#:  [id number]     BIRTHDATE:  [birth year], 50 yrs. old  GENDER:  female     ENDOSCOPIST:  Vania Rea. Jarold Motto, MD, [facility]     REF. BY:  Loreen Freud, DO     PROCEDURE DATE:  12/26/2010     PROCEDURE:  Colonoscopy with snare polypectomy     ASA CLASS:  Class II     INDICATIONS:  Routine Risk Screening     MEDICATIONS:   Fentanyl 50, Versed 6 mg IV          DESCRIPTION OF PROCEDURE:   After the risks benefits and     alternatives of the procedure were thoroughly explained, informed     consent was obtained.  Digital rectal exam was performed and     revealed no abnormalities.   The LB CF-H180AL [id number] endoscope     was introduced through the anus and advanced to the cecum, without     limitations.  The quality of the prep was excellent, using     MoviPrep.  The instrument was then slowly withdrawn as the colon     was fully examined.     <<PROCEDUREIMAGES>>          FINDINGS:  A sessile polyp was found in the cecum. 9 mm flat     spreading cecal polyp hot snare excised.  This was otherwise a     normal examination of the colon.   Retroflexed views in the rectum     revealed no abnormalities.    The scope was then withdrawn from     the patient and the procedure completed.          COMPLICATIONS:  None     ENDOSCOPIC IMPRESSION:     1) Sessile polyp in the cecum     2) Otherwise normal examination     r/o adenoma.     RECOMMENDATIONS:     1) Repeat colonoscopy in 5 years if polyp adenomatous; otherwise     10 years     REPEAT EXAM:  No          ______________________________     Vania Rea. Jarold Motto, MD, [facility]          CC:           n.     eSIGNED:   Vania Rea. Maleki Hippe at 12/26/2010 02:15 PM          Rhea Bleacher, [id number]  Note: An exclamation mark (!) indicates a result that was not dispersed into the flowsheet. Document Creation Date: 12/26/2010 2:15 PM _______________________________________________________________________  (1) Order result status: Final Collection or observation date-time: 12/26/2010 13:55 Requested date-time:  Receipt date-time:  Reported date-time:  Referring Physician:   Ordering Physician: Sheryn Bison [phone]) Specimen Source:  Source: Launa Grill Order Number: [id number] Lab site:   Appended Document: Colonoscopy 5 year followup  Appended Document: Colonoscopy     Procedures Next Due Date:    Colonoscopy: 12/2015

## 2011-01-01 NOTE — Procedures (Addendum)
Summary: Upper Endoscopy   Patient: April Robinson Note: All result statuses are Final unless otherwise noted.  Tests: (1) Upper Endoscopy (EGD)   EGD Upper Endoscopy       DONE     [facility]     [address]     [place], [place]  [address]          ENDOSCOPY PROCEDURE REPORT          PATIENT:  April Robinson, April Robinson  MR#:  [id number]     BIRTHDATE:  22-May-1961, 50 yrs. old  GENDER:  female          ENDOSCOPIST:  Vania Rea. Jarold Motto, MD, [facility]     Referred by:  Loreen Freud, DO          PROCEDURE DATE:  12/26/2010     PROCEDURE:  EGD with biopsy, [address]     ASA CLASS:  Class II     INDICATIONS:  GERD, abdominal pain despite treatment          MEDICATIONS:   There was residual sedation effect present from     prior procedure., Fentanyl 25 mcg IV, Versed 2 mg IV     TOPICAL ANESTHETIC:  Exactacain Spray          DESCRIPTION OF PROCEDURE:   After the risks benefits and     alternatives of the procedure were thoroughly explained, informed     consent was obtained.  The LB GIF-H180 [id number] endoscope was     introduced through the mouth and advanced to the second portion of     the duodenum, without limitations.  The instrument was slowly     withdrawn as the mucosa was fully examined.     <<PROCEDUREIMAGES>>          There were multiple polyps identified. 5-9 MM FUNDAL POLYPS     BIOPSIED.SEE PICTURES.  Normal duodenal folds were noted.  The     esophagus and gastroesophageal junction were completely normal in     appearance.    Retroflexed views revealed a hiatal hernia.  5 CM     HH NOTED.NO BARRETT'S MUCOSA OR STRICTURE.FREE REFLUX NOTED.  The     scope was then withdrawn from the patient and the procedure     completed.          COMPLICATIONS:  None          ENDOSCOPIC IMPRESSION:     1) Polyps, multiple     2) A hiatal hernia     1.BENIGN HYPERPLASTIC FUNDAL POLYPS FROM PPI USE     2.HH AND CHRONIC GERD     RECOMMENDATIONS:     1) Await  pathology results     2) continue PPI     3) continue current medications          REPEAT EXAM:  No          ______________________________     Vania Rea. Jarold Motto, MD, [facility]          CC:          n.     eSIGNED:   Vania Rea. Eduardo Honor at 12/26/2010 02:22 PM          Rhea Bleacher, [id number]  Note: An exclamation mark (!) indicates a result that was not dispersed into the flowsheet. Document Creation Date: 12/26/2010 2:22 PM _______________________________________________________________________  (1) Order result status: Final Collection or observation date-time: 12/26/2010 14:06 Requested date-time:  Receipt date-time:  Reported date-time:  Referring Physician:   Ordering Physician: Sheryn Bison [id number]) Specimen Source:  Source: Launa Grill Order Number: [id number] Lab site:

## 2011-01-06 NOTE — Letter (Signed)
Summary: Appt Reminder 2  [facility]  [address] [place], [place] [address]   Phone: [phone]  Fax: [phone]        December 29, 2010 MRN: [id number]    April Robinson [address] [place], [place]  [address]    Dear [name],   You have a return appointment with Dr. Jarold Motto on 01/20/2011 at 8:45am.  Please remember to bring a complete list of the medicines you are taking, your insurance card and your co-pay.  If you have to cancel or reschedule this appointment, please call before 5:00 pm the evening before to avoid a cancellation fee.  If you have any questions or concerns, please call [phone].    Sincerely,    Harlow Mares CMA (AAMA)  Appended Document: Appt Reminder 2 mailed and pt aware

## 2011-01-06 NOTE — Letter (Signed)
Summary: Patient [facility] Biopsy Results  [facility]  [address] [place], [place] [address]   Phone: [phone]  Fax: [phone]        December 31, 2010 MRN: [id number]    MARIANNE GOLIGHTLY [address] [place], [place]  [address]    Dear Ms. BARNETT-Honse,  I am pleased to inform you that the biopsies taken during your recent endoscopic examination did not show any evidence of cancer upon pathologic examination.  Additional information/recommendations:  __No further action is needed at this time.  Please follow-up with      your primary care physician for your other healthcare needs.  __ Please call [phone] to schedule a return visit to review      your condition.  _X_ Continue with the treatment plan as outlined on the day of your      exam.  __ You should have a repeat endoscopic examination for this problem              in _ months/years.   Please call us if you are having persistent problems or have questions about your condition that have not been fully answered at this time.  Sincerely,  Mardella Layman MD [facility]  This letter has been electronically signed by your physician.  Appended Document: Patient Notice-Endo Biopsy Results letter mailed

## 2011-01-06 NOTE — Progress Notes (Signed)
Summary: Refill-Ritalin  Phone Note Call from Patient Call back at Work Phone [phone]   Caller: Patient Reason for Call: Referral Summary of Call: Pt is here requesting a refill on Ritalin. It is not active on her medication list. Please contact patient at [phone] Initial call taken by: Lavell Islam,  January 01, 2011 4:51 PM  Follow-up for Phone Call        on list, it was not removed but was not Active.Marland KitchenMarland KitchenI Printed RX and sign by [employer].. Patient aware rx ready for pick up.... [name] CMA Duncan Dull)  January 01, 2011 4:59 PM  Follow-up by: [name] CMA Duncan Dull),  January 01, 2011 4:59 PM    New/Updated Medications: RITALIN 10 MG TABS (METHYLPHENIDATE HCL) 1 by mouth two times a day Prescriptions: RITALIN 10 MG TABS (METHYLPHENIDATE HCL) 1 by mouth two times a day  #60 x 0   Entered by:   [name] CMA (AAMA)   Authorized by:   Loreen Freud DO   Signed by:   [name] CMA (AAMA) on 01/01/2011   Method used:   Print then Give to Patient   RxID:   [id number]

## 2011-01-06 NOTE — Letter (Signed)
Summary: Patient Notice- Polyp Results  [facility]  [address] [place], [place] [address]   Phone: [phone]  Fax: [phone]        December 30, 2010 MRN: [id number]    April Robinson [address] [place], [place]  [address]    Dear Ms. BARNETT-Rhyner,  I am pleased to inform you that the colon polyp(s) removed during your recent colonoscopy was (were) found to be benign (no cancer detected) upon pathologic examination.  I recommend you have a repeat colonoscopy examination in 5_ years to look for recurrent polyps, as having colon polyps increases your risk for having recurrent polyps or even colon cancer in the future.  Should you develop new or worsening symptoms of abdominal pain, bowel habit changes or bleeding from the rectum or bowels, please schedule an evaluation with either your primary care physician or with me.  Additional information/recommendations:  _x_ No further action with gastroenterology is needed at this time. Please      follow-up with your primary care physician for your other healthcare      needs.  __ Please call [phone] to schedule a return visit to review your      situation.  __ Please keep your follow-up visit as already scheduled.  __ Continue treatment plan as outlined the day of your exam.  Please call us if you are having persistent problems or have questions about your condition that have not been fully answered at this time.  Sincerely,  Mardella Layman MD [facility]  This letter has been electronically signed by your physician.  Appended Document: Patient Notice- Polyp Results letter mailed

## 2011-01-20 ENCOUNTER — Encounter: Payer: Self-pay | Admitting: Gastroenterology

## 2011-01-20 ENCOUNTER — Ambulatory Visit (INDEPENDENT_AMBULATORY_CARE_PROVIDER_SITE_OTHER): Payer: Managed Care, Other (non HMO) | Admitting: Gastroenterology

## 2011-01-20 ENCOUNTER — Encounter: Payer: Self-pay | Admitting: *Deleted

## 2011-01-20 VITALS — BP 132/76 | HR 64 | Ht 62.0 in | Wt 184.0 lb

## 2011-01-20 DIAGNOSIS — R1012 Left upper quadrant pain: Secondary | ICD-10-CM

## 2011-01-20 DIAGNOSIS — K219 Gastro-esophageal reflux disease without esophagitis: Secondary | ICD-10-CM

## 2011-01-20 DIAGNOSIS — K589 Irritable bowel syndrome without diarrhea: Secondary | ICD-10-CM

## 2011-01-20 MED ORDER — CILIDINIUM-CHLORDIAZEPOXIDE 2.5-5 MG PO CAPS: 1.0000 | ORAL_CAPSULE | Freq: Two times a day (BID) | ORAL | Status: AC | PRN

## 2011-01-20 MED ORDER — BENEFIBER PO POWD: 1.0000 | ORAL | Status: DC

## 2011-01-20 NOTE — Patient Instructions (Signed)
Your prescription(s) have been sent to you pharmacy.  Take Benefiber daily. Call back in 2 weeks with an update.  Today we gave you handouts on IBS and High Fiber Diet.

## 2011-01-20 NOTE — Assessment & Plan Note (Addendum)
Her GERD is markedly improved on Dexilant 60 mg a day the standard anti-reflex present. I've made no changes in these medications recommendations.

## 2011-01-20 NOTE — Progress Notes (Signed)
History of Present Illness:  This is a  50 year old African American female with left upper quadrant pain of unexplained etiology with a negative workup to date. Space deficit associated with stress of mild constipation, and mild gas and bloating. I reviewed her endoscopy and colonoscopy and ultrasound and lab reports today which were all unremarkable. She has no hepatobiliary or systemic complaints.          Physical Exam: General well developed well nourished patient in no acute distress, appearing their stated age Eyes PERRLA, no icterus fundoscopic exam per opthamologist  Chest clear to percussion and auscultation Heart no significant murmurs, gallops or rubs noted Abdomen no hepatosplenomegaly masses or tenderness, BS normal.   Extremities no acute joint lesions, edema, phlebitis or evidence of cellulitis. Neurologic patient oriented x 3, cranial nerves intact, no focal neurologic deficits noted. Psychological mental status normal and normal affect.

## 2011-01-20 NOTE — Assessment & Plan Note (Addendum)
This patient continues with left quadrant pain which is related to stress and mild constipation. Her clinical workup was reviewed with her in detail today was otherwise unremarkable including endoscopy, colonoscopy, an upper ultrasound and multiple labs. Her pain seems to be a crampy type pain which will come on especially with stress , and has no real other alleviating or precipitating elements. Does not awaken her from sleep and is not associated with any specific hepatobiliary or  Systemic complaints. Examination of the abdomen today is totally unremarkable without organomegaly, masses, or tenderness. I placed him a high fiber diet with daily Benefiber and we'll try Librax twice a day with progress report in 2 weeks time

## 2011-01-27 NOTE — Letter (Signed)
Summary: Generic Letter  [facility]  [address] [place], [place] [address]   Phone: [phone]  Fax: [phone]    01/20/2011  LAIYAH EXLINE [address] [place], [place]  [address]  [place]     To whom it may concern,   Due to a medical condition please excuse Mrs. Barnett-Giglia to have   access to a restroom through out the day. If there are any questions   please call [phone].      Sincerely,   Vania Rea. Jarold Motto, MD

## 2011-03-25 ENCOUNTER — Ambulatory Visit (INDEPENDENT_AMBULATORY_CARE_PROVIDER_SITE_OTHER): Payer: Managed Care, Other (non HMO) | Admitting: Internal Medicine

## 2011-03-25 ENCOUNTER — Encounter: Payer: Self-pay | Admitting: Internal Medicine

## 2011-03-25 VITALS — BP 116/80 | HR 116 | Wt 181.8 lb

## 2011-03-25 DIAGNOSIS — S139XXA Sprain of joints and ligaments of unspecified parts of neck, initial encounter: Secondary | ICD-10-CM

## 2011-03-25 MED ORDER — HYDROCODONE-ACETAMINOPHEN 5-500 MG PO TABS: 1.0000 | ORAL_TABLET | ORAL | Status: DC | PRN

## 2011-03-25 MED ORDER — PREDNISONE 10 MG PO TABS: ORAL_TABLET | ORAL | Status: AC

## 2011-03-25 NOTE — Progress Notes (Signed)
  Subjective:    Patient ID: April Robinson, female    DOB: [birth year], 50 y.o.   MRN: [id number]  HPI Saw  Dr. Laury Axon 2-12 with neck pain, was prescribed heat, stretching, chiropractor. Since then, pain has been on and off, did not see a chiropractor. Latest exacerbation started 2 weeks ago, last week she went to the urgent care, was prescribed methocarbamol and a painkiller that she has not picked up yet. She continued to taking Flexeril. Symptoms are not really better this week. Pain originate on the R trapezoid area, close to the base of the neck and radiates down to the deltoid area. The pain is a steady but definitely exacerbated by neck motion. She can only turn her neck to the right  if she is applying pressure to the spine.   Past Medical History: Depression GERD Shingles (per EPIC PMH she has a number of items on the past medical history, most of them are wrong. I assume is a data entry error) Review of Systems No lower or upper extremity  paresthesias.     Objective:   Physical Exam  Constitutional: She appears well-developed and well-nourished.  Cardiovascular: Normal rate, regular rhythm and normal heart sounds.   No murmur heard. Pulmonary/Chest: Effort normal and breath sounds normal. No respiratory distress. She has no wheezes. She has no rales.  Musculoskeletal:       Arms: Neurological:       Neck range of motion decreased throughout, particularly when she turns her head to the right. Extremities strength and DTRs normal.          Assessment & Plan:

## 2011-03-25 NOTE — Patient Instructions (Signed)
Warm compresses For pain take only methocarbamol, Vicodin, prednisone. We are referring you to a  orthopedic doctor.

## 2011-03-25 NOTE — Assessment & Plan Note (Addendum)
Symptoms started 3 months ago, plan: She needs only one muscle relaxant, will stick to methocarbamol. Prednisone for a few days Vicodin as needed Refer to ortho Will need to [facility] papers, she has been out from 03-25-11  To  03-27-11

## 2011-03-27 ENCOUNTER — Telehealth: Payer: Self-pay

## 2011-03-27 NOTE — Telephone Encounter (Signed)
Pt states that she has some questions regarding returning to work, when should pt return to work. UC gave her a note stating she was out from the 30th-1st. Is she supposed to to wait to see ortho first before returning to work?

## 2011-03-27 NOTE — Telephone Encounter (Signed)
Pt left msg on voicemail in regards to [facility] papers.   Called pt left msg to return call to get more information.

## 2011-03-30 ENCOUNTER — Telehealth: Payer: Self-pay | Admitting: Internal Medicine

## 2011-03-30 ENCOUNTER — Encounter: Payer: Self-pay | Admitting: *Deleted

## 2011-03-30 ENCOUNTER — Telehealth: Payer: Self-pay | Admitting: Family Medicine

## 2011-03-30 NOTE — Telephone Encounter (Signed)
I spoke w/ pt she is aware, she states she is going to wait until after ortho appt.

## 2011-03-30 NOTE — Telephone Encounter (Signed)
Is okay to go back to work unless she is unable to perform her duties

## 2011-03-30 NOTE — Telephone Encounter (Signed)
error 

## 2011-03-30 NOTE — Telephone Encounter (Signed)
Ok to go back to work unless pain is severe or unable pt unable to do her duties

## 2011-03-30 NOTE — Telephone Encounter (Signed)
Pt is aware, will place a note up front releasing her back to work.

## 2011-03-30 NOTE — Telephone Encounter (Signed)
Dr.Paz- okay with her waiting to go back to work?

## 2011-03-31 ENCOUNTER — Encounter: Payer: Self-pay | Admitting: Family Medicine

## 2011-03-31 ENCOUNTER — Ambulatory Visit (INDEPENDENT_AMBULATORY_CARE_PROVIDER_SITE_OTHER): Payer: Managed Care, Other (non HMO) | Admitting: Family Medicine

## 2011-03-31 DIAGNOSIS — M791 Myalgia, unspecified site: Secondary | ICD-10-CM

## 2011-03-31 DIAGNOSIS — IMO0001 Reserved for inherently not codable concepts without codable children: Secondary | ICD-10-CM

## 2011-03-31 DIAGNOSIS — R5383 Other fatigue: Secondary | ICD-10-CM

## 2011-03-31 DIAGNOSIS — B370 Candidal stomatitis: Secondary | ICD-10-CM

## 2011-03-31 DIAGNOSIS — R5381 Other malaise: Secondary | ICD-10-CM

## 2011-03-31 LAB — CBC WITH DIFFERENTIAL/PLATELET
Eosinophils Relative: 0.7 % (ref 0.0–5.0)
HCT: 45.4 % (ref 36.0–46.0)
Hemoglobin: 15.2 g/dL — ABNORMAL HIGH (ref 12.0–15.0)
Lymphs Abs: 4.1 10*3/uL — ABNORMAL HIGH (ref 0.7–4.0)
MCHC: 33.5 g/dL (ref 30.0–36.0)
MCV: 89.7 fl (ref 78.0–100.0)
Monocytes Relative: 7.1 % (ref 3.0–12.0)
Neutro Abs: 6.2 10*3/uL (ref 1.4–7.7)
Neutrophils Relative %: 55.3 % (ref 43.0–77.0)
Platelets: 240 10*3/uL (ref 150.0–400.0)
RDW: 14.7 % — ABNORMAL HIGH (ref 11.5–14.6)

## 2011-03-31 LAB — BASIC METABOLIC PANEL
BUN: 19 mg/dL (ref 6–23)
CO2: 31 mEq/L (ref 19–32)
Sodium: 140 mEq/L (ref 135–145)

## 2011-03-31 LAB — HEPATIC FUNCTION PANEL
ALT: 17 U/L (ref 0–35)
Bilirubin, Direct: 0.1 mg/dL (ref 0.0–0.3)
Total Protein: 7.2 g/dL (ref 6.0–8.3)

## 2011-03-31 LAB — SEDIMENTATION RATE: Sed Rate: 8 mm/hr (ref 0–22)

## 2011-03-31 MED ORDER — NYSTATIN 100000 UNIT/ML MT SUSP: 500000.0000 [IU] | Freq: Four times a day (QID) | OROMUCOSAL | Status: AC

## 2011-03-31 NOTE — Assessment & Plan Note (Signed)
Check labs con't meds for now

## 2011-03-31 NOTE — Assessment & Plan Note (Signed)
Nystatin swish and spit rto prn

## 2011-03-31 NOTE — Progress Notes (Signed)
  Subjective:    Patient ID: April Robinson, female    DOB: [birth year], 50 y.o.   MRN: [id number]  HPI Pt here c/o muscle aches all over.  She saw Dr Drue Novel last week and ortho referral was made--appointment is tomorrow.   Pt also c/o sores in mouth.  She started prednisone last week.   No other complaints.   Review of Systems    as above Objective:   Physical Exam  Constitutional: She is oriented to person, place, and time. She appears well-developed and well-nourished.  HENT:       + white film L side mouth--c/w thrush No blisters  Neck: Normal range of motion. Neck supple.  Musculoskeletal: Normal range of motion. She exhibits no edema and no tenderness.  Neurological: She is alert and oriented to person, place, and time.  Psychiatric: She has a normal mood and affect. Her behavior is normal.          Assessment & Plan:

## 2011-03-31 NOTE — Patient Instructions (Signed)
Thrush, Adult      Thrush is a yeast infection that develops in the mouth and throat and on the tongue. The medical term for this is oropharyngeal candidiasis, or OPC. Thrush is most common in older adults, but it can occur at any age. Thrush occurs when a yeast called candida grows out of control. Candida normally is present in small amounts in the mouth and on other mucous membranes. However, under certain circumstances, candida can grow rapidly, causing thrush. Thrush can be a recurring problem for people who have chronic illnesses or who take medications that limit the body's ability to fight infection (weakened immune system). Since these people have difficulty fighting infections, the fungus that causes thrush can spread throughout the body. This can cause life-threatening blood or organ infections.     CAUSES   Candida, the yeast that causes thrush, is normally present in small amounts in the mouth and on other mucous membranes. It usually causes no harm. However, when conditions are present that allow the yeast to grow uncontrolled, it invades surrounding tissues and becomes an infection. Thrush is most commonly caused by the yeast Candida albicans. Less often, other forms of candida can lead to thrush.     There are many types of bacteria in your mouth that normally control the growth of candida. Sometimes a new type of bacteria gets into your mouth and disrupts the balance of the germs already there. This can allow candida to overgrow. Other factors that increase your risk of developing thrush include:   An impaired ability to fight infection (weakened immune system). A normal immune system is usually strong enough to prevent candida from overgrowing.    Older adults are more likely to develop thrush because they may have weaker immune systems.    People with human immunodeficiency virus (HIV) infection have a high likelihood of developing thrush. About 90% of people with HIV develop thrush at some  point during the course of their disease.   People with diabetes are more likely to get thrush because high blood sugar levels promote overgrowth of the candida fungus.   A dry mouth (xerostomia). Dry mouth can result from overuse of mouthwashes or from certain conditions such as Sjgren's syndrome.    Pregnancy. Hormone changes during pregnancy can lead to thrush by altering the balance of bacteria in the mouth.    Poor dental care, especially in people who have false teeth.    The use of antibiotic medications. This may lead to thrush by changing the balance of bacteria in the mouth.     SYMPTOMS  Thrush can be a mild infection that causes no symptoms. If symptoms develop, they may include the following:   A burning feeling in the mouth and throat. This can occur at the start of a thrush infection.    White patches that adhere to the mouth and tongue. The tissue around the patches may be red, raw, and painful. If rubbed (during tooth brushing, for example), the patches and the tissue of the mouth may bleed easily.    A bad taste in the mouth or difficulty tasting foods.   Cottony feeling in the mouth.   Sometimes pain during eating and swallowing.     DIAGNOSIS  Your caregiver can usually diagnose thrush by exam. In addition to looking in your mouth, your caregiver will ask you questions about your health.     TREATMENT  Medications that help prevent the growth of fungi (antifungals) are the standard   treatment for thrush. These medications are either applied directly to the affected area (topical) or swallowed (oral).  Mild thrush  In adults, mild cases of thrush may clear up with simple treatment that can be done at home. This treatment usually involves using an antifungal mouth rinse or lozenges. Treatment usually lasts about 14 days.  Moderate to severe thrush   More severe thrush infections that have spread to the esophagus are treated with an oral antifungal medication. A topical antifungal  medication may also be used.   For some severe infections, a treatment period longer than 14 days may be needed.   Oral antifungal medications are almost never used during pregnancy because the fetus may be harmed. However, if a pregnant woman has a rare, severe thrush infection that has spread to her blood, oral antifungal medications may be used. In this case, the risk of harm to the mother and fetus from the severe thrush infection may be greater than the risk posed by the use of antifungal medications.  Persistent or recurrent thrush  Persistent (does not go away) or recurrent (keeps coming back) cases of thrush may:   Need to be treated twice as long as the symptoms last.    Require treatment with both oral and topical antifungal medications.   People with weakened immune systems can take an antifungal medication on a continuous basis to prevent thrush infections.  It is important to treat conditions that make you more likely to get thrush, such as diabetes, human immunodeficiency virus (HIV), or cancer.      HOME CARE INSTRUCTIONS   If you are breast-feeding, you should clean your nipples with an antifungal medication, such as nystatin (Mycostatin). Dry your nipples after breast-feeding. Applying lanolin-containing body lotion may help relieve nipple soreness.   If you wear dentures and get thrush, remove dentures before going to bed, brush them vigorously, and soak in a solution of chlorhexidine gluconate or a product such as Polident or Efferdent.   Eating plain, unflavored yogurt that contains live cultures (check the label) can also help cure thrush. Yogurt helps healthy bacteria grow in the mouth. These bacteria stop the growth of the yeast that causes thrush.   Adults can treat thrush at home with gentian violet (1%), a dye that kills bacteria and fungi. It is available without a prescription. If there is no known cause for the infection or if gentian violet does not cure the thrush, you need  to see your caregiver.  Comfort measures  Measures can be taken to reduce the discomfort of thrush:   Drink cold liquids such as water or iced tea. Eat flavored ice treats or frozen juices.    Eat foods that are easy to swallow such as gelatin, ice cream, or custard.    If the patches are painful, try drinking from a straw.    Rinse your mouth several times a day with a warm saltwater rinse. You can make the saltwater mixture with 1 tsp (5 g) of salt in 8 fl oz (0.2 L) of warm water.     PROGNOSIS   Most cases of thrush are mild and clear up with the use of an antifungal mouth rinse or lozenges. Very mild cases of thrush may clear up without medical treatment. It usually takes about 14 days of treatment with an oral antifungal medication to cure more severe thrush infections. In some cases, thrush may last several weeks even with treatment.   If thrush goes untreated and does   not go away by itself, it can spread to other parts of the body.   Thrush can spread to the throat, the vagina, or the skin. It rarely spreads to other organs of the body.  Thrush is more likely to recur (come back) in:   People who use inhaled corticosteroids to treat asthma.    People who take antibiotic medications for a long time.    People who have false teeth.    People who have a weakened immune system.      COMPLICATIONS  Complications related to thrush are rare in healthy people.     THERE ARE SEVERAL FACTORS THAT CAN INCREASE YOUR RISK OF DEVELOPING THRUSH.  Age  Older adults, especially those who have serious health problems, are more likely to develop thrush because their immune systems are likely to be weaker.  Behavior   The yeast that causes thrush can be spread by oral sex.    Heavy smoking can lower the body's ability to fight off infections. This makes thrush more likely to develop.  Other conditions   False teeth (dentures), braces, or a retainer that irritates the mouth make it hard to keep the mouth clean. An  unclean mouth is more likely to develop thrush than a clean mouth.    People with a weakened immune system, such as those who have diabetes or human immunodeficiency virus (HIV) or who are undergoing chemotherapy, have an increased risk for developing thrush.  Medications  Some medications can allow the fungus that causes thrush to grow uncontrolled. Common ones are:   Antibiotics, especially those that kill a wide range of organisms (broad-spectrum antibiotics), such as tetracycline commonly can cause thrush.    Birth control pills (oral contraceptives).    Medications that weaken the body's immune system, such as corticosteroids.  Environment  Exposure over time to certain environmental chemicals, such as benzene and pesticides, can weaken the body's immune system. This increases your risk for developing infections, including thrush.     SEEK IMMEDIATE MEDICAL ATTENTION IF:   Your symptoms are getting worse or are not improving within 7 days of starting treatment.   You have symptoms of spreading infection, such as white patches on the skin outside of the mouth.   You are nursing and you have redness and pain in the nipples in spite of home treatment or if you have burning pain in the nipple area when you nurse. Your baby's mouth should also be examined to determine whether thrush is causing your symptoms.     Document Released: 07/07/2004  Document Re-Released: 01/08/2009  [employer] Patient Information 2011 [employer].

## 2011-04-01 LAB — ANA: Anti Nuclear Antibody(ANA): NEGATIVE

## 2011-04-03 LAB — VITAMIN D 1,25 DIHYDROXY: Vitamin D 1, 25 (OH)2 Total: 140 pg/mL — ABNORMAL HIGH (ref 18–72)

## 2011-04-08 ENCOUNTER — Telehealth: Payer: Self-pay | Admitting: *Deleted

## 2011-04-08 ENCOUNTER — Other Ambulatory Visit (INDEPENDENT_AMBULATORY_CARE_PROVIDER_SITE_OTHER): Payer: Managed Care, Other (non HMO)

## 2011-04-08 DIAGNOSIS — D7289 Other specified disorders of white blood cells: Secondary | ICD-10-CM

## 2011-04-08 LAB — CBC WITH DIFFERENTIAL/PLATELET
Basophils Relative: 0.4 % (ref 0.0–3.0)
Eosinophils Relative: 1.2 % (ref 0.0–5.0)
Lymphocytes Relative: 41.4 % (ref 12.0–46.0)
MCV: 89.4 fl (ref 78.0–100.0)
Neutrophils Relative %: 49.8 % (ref 43.0–77.0)
RBC: 4.81 Mil/uL (ref 3.87–5.11)
WBC: 6.7 10*3/uL (ref 4.5–10.5)

## 2011-04-08 NOTE — Telephone Encounter (Signed)
Pt notes that she did have a reaction to a med previously which she discuss at OV with dr Laury Axon. Pt also states that she advise dr Laury Axon at [facility] that she might possibly have shingle outbreak but dr Laury Axon advise otherwise. Pt coming in today to repeat labs.

## 2011-04-08 NOTE — Telephone Encounter (Signed)
Pt aware.

## 2011-04-08 NOTE — Telephone Encounter (Signed)
Shingle has nothing to do with wbc.

## 2011-04-08 NOTE — Telephone Encounter (Signed)
Message copied by Verdene Rio on Wed Apr 08, 2011 11:44 AM ------      Message from: Lelon Perla      Created: Sun Apr 05, 2011  8:21 PM       WBC elevated----does pt have and congestion, fever etc----recheck next week  288.8  cbcd

## 2011-05-05 ENCOUNTER — Other Ambulatory Visit: Payer: Self-pay | Admitting: Family Medicine

## 2011-05-22 ENCOUNTER — Ambulatory Visit (INDEPENDENT_AMBULATORY_CARE_PROVIDER_SITE_OTHER): Payer: Managed Care, Other (non HMO) | Admitting: Women's Health

## 2011-05-22 ENCOUNTER — Encounter: Payer: Self-pay | Admitting: Women's Health

## 2011-05-22 ENCOUNTER — Other Ambulatory Visit: Payer: Self-pay | Admitting: Gynecology

## 2011-05-22 ENCOUNTER — Other Ambulatory Visit (HOSPITAL_COMMUNITY)
Admission: RE | Admit: 2011-05-22 | Discharge: 2011-05-22 | Disposition: A | Discharge status: Home / Self Care | Payer: Managed Care, Other (non HMO) | Source: Ambulatory Visit | Attending: Women's Health | Admitting: Women's Health

## 2011-05-22 VITALS — BP 130/70 | Ht 64.25 in | Wt 181.0 lb

## 2011-05-22 DIAGNOSIS — Z01419 Encounter for gynecological examination (general) (routine) without abnormal findings: Secondary | ICD-10-CM | POA: Insufficient documentation

## 2011-05-22 DIAGNOSIS — A609 Anogenital herpesviral infection, unspecified: Secondary | ICD-10-CM

## 2011-05-22 DIAGNOSIS — Z780 Asymptomatic menopausal state: Secondary | ICD-10-CM

## 2011-05-22 DIAGNOSIS — B009 Herpesviral infection, unspecified: Secondary | ICD-10-CM

## 2011-05-22 DIAGNOSIS — N63 Unspecified lump in unspecified breast: Secondary | ICD-10-CM

## 2011-05-22 MED ORDER — VALACYCLOVIR HCL 500 MG PO TABS: 500.0000 mg | ORAL_TABLET | Freq: Two times a day (BID) | ORAL | Status: DC

## 2011-05-22 MED ORDER — ESTRADIOL ACETATE 0.05 MG/24HR VA RING: 1.0000 | VAGINAL_RING | VAGINAL | Status: DC

## 2011-05-22 NOTE — Progress Notes (Signed)
April Robinson [birth year] [id number]    History:    The patient presents for annual exam. 50 year old with past history of hysterectomy/. Fibroids. Using Femring 0.5 with good relief of symptoms. History of HSV with infrequent outbreaks.  Labs and other medications are at her primary care.   Past medical history, past surgical history, family history and social history were all reviewed and documented in the EPIC chart.   ROS:  A 14 point ROS was performed and pertinent positives and negatives are included in the history.  Exam:  Filed Vitals:   05/22/11 1404  BP: 130/70    General appearance:  Normal Head/Neck:  Normal, without cervical or supraclavicular adenopathy. Thyroid:  Symmetrical, normal in size, without palpable masses or nodularity. Respiratory  Effort:  Normal  Auscultation:  Clear without wheezing or rhonchi Cardiovascular  Auscultation:  Regular rate, without rubs, murmurs or gallops  Edema/varicosities:  Not grossly evident Abdominal  Masses/tenderness:  Soft,nontender, without masses, guarding or rebound.  Liver/spleen:  No organomegaly noted  Hernia:  None appreciated  Occult test:   Skin  Inspection:  Grossly normal  Palpation:  Grossly normal Neurologic/psychiatric  Orientation:  Normal with appropriate conversation.  Mood/affect:  Normal  Genitourinary    Breasts: Examined lying and sitting.     Right: Without masses, retractions, discharge or axillary adenopathy.     Left: Without masses, retractions, discharge or axillary adenopathy.   Inguinal/mons:  Normal without inguinal adenopathy  External genitalia:  Normal  BUS/Urethra/Skene's glands:  Normal  Bladder:  Normal  Vagina:  Normal  Cervix: AbsentAbsent  Uterus:    Adnexa/parametria:     Rt: Without masses or tenderness.   Lt: Without masses or tenderness.  Anus and perineum: Normal  Digital rectal exam: Normal sphincter tone without palpated masses or  tenderness.   Assessment/Plan:  50 y.o. year old female for annual exam.   50 yo MBF G3P3. States is doing well. Had good relief of her menopausal symptoms with the Femring, a prescription was given. Did review slight risk of blood clots strokes and breast cancer, accepts. Rare HSV outbreaks. Uses occasional Valtrex 500 by mouth twice a day for 2-3 days with good relief. SBE yearly mammogram, she is overdue for her mammogram and will get that scheduled. Significant family history her mother did have breast cancer doing well, she had it after menopause. Review to report any changes. Long discussion on weight in relationship to health, exercise, and just general heart health. Reviewed bone density. She states it is not covered by her insurance and would like to do that next year. Vitamin D 2000 daily encouraged, fish oil supplement encouraged. She has had a normal colonoscopy last year.  Pap only today    Harrington Challenger MD, 3:17 PM 05/22/2011

## 2011-05-22 NOTE — Patient Instructions (Signed)
Vit d 2000 daily Calcium rich foods Exercise!!! Dexa  Mammogram Have fun!!!!!

## 2011-05-25 ENCOUNTER — Other Ambulatory Visit: Payer: Self-pay

## 2011-05-25 DIAGNOSIS — N6325 Unspecified lump in the left breast, overlapping quadrants: Secondary | ICD-10-CM

## 2011-05-27 ENCOUNTER — Telehealth: Payer: Self-pay | Admitting: Women's Health

## 2011-05-27 NOTE — Telephone Encounter (Signed)
Telephone call to patient. Informed Pap was normal but positive for yeast. Currently having no symptoms. Will use an over-the-counter Monistat as needed.

## 2011-05-28 ENCOUNTER — Ambulatory Visit
Admission: RE | Admit: 2011-05-28 | Discharge: 2011-05-28 | Disposition: A | Discharge status: Home / Self Care | Payer: Managed Care, Other (non HMO) | Source: Ambulatory Visit | Attending: Gynecology | Admitting: Gynecology

## 2011-05-28 DIAGNOSIS — N63 Unspecified lump in unspecified breast: Secondary | ICD-10-CM

## 2011-05-29 ENCOUNTER — Telehealth: Payer: Self-pay | Admitting: *Deleted

## 2011-05-29 NOTE — Telephone Encounter (Signed)
PT INFORMED WITH THE BELOW NOTE. 

## 2011-05-29 NOTE — Telephone Encounter (Signed)
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 It is better to be on the safe side and proceed with the recommended procedure.

## 2011-06-01 ENCOUNTER — Other Ambulatory Visit: Payer: Self-pay | Admitting: Gynecology

## 2011-06-01 ENCOUNTER — Other Ambulatory Visit: Payer: Self-pay | Admitting: *Deleted

## 2011-06-01 ENCOUNTER — Telehealth: Payer: Self-pay | Admitting: *Deleted

## 2011-06-01 DIAGNOSIS — N63 Unspecified lump in unspecified breast: Secondary | ICD-10-CM

## 2011-06-01 DIAGNOSIS — N6049 Mammary duct ectasia of unspecified breast: Secondary | ICD-10-CM

## 2011-06-01 NOTE — Telephone Encounter (Signed)
Prior auth approved and will remain in effect as long as the member is on this policy. Approval letter scan to chart.

## 2011-06-03 ENCOUNTER — Other Ambulatory Visit: Payer: Self-pay | Admitting: Obstetrics and Gynecology

## 2011-06-09 ENCOUNTER — Inpatient Hospital Stay: Admission: RE | Admit: 2011-06-09 | Payer: Managed Care, Other (non HMO) | Source: Ambulatory Visit

## 2011-06-22 ENCOUNTER — Ambulatory Visit
Admission: RE | Admit: 2011-06-22 | Discharge: 2011-06-22 | Disposition: A | Discharge status: Home / Self Care | Payer: Managed Care, Other (non HMO) | Source: Ambulatory Visit | Attending: Gynecology | Admitting: Gynecology

## 2011-06-22 ENCOUNTER — Other Ambulatory Visit: Payer: Self-pay | Admitting: Gynecology

## 2011-06-22 DIAGNOSIS — N6049 Mammary duct ectasia of unspecified breast: Secondary | ICD-10-CM

## 2011-06-22 DIAGNOSIS — R928 Other abnormal and inconclusive findings on diagnostic imaging of breast: Secondary | ICD-10-CM

## 2011-06-22 MED ORDER — GADOBENATE DIMEGLUMINE 529 MG/ML IV SOLN
17.0000 mL | Freq: Once | INTRAVENOUS | Status: AC | PRN
  Administered 2011-06-22: 17 mL via INTRAVENOUS

## 2011-07-03 ENCOUNTER — Ambulatory Visit
Admission: RE | Admit: 2011-07-03 | Discharge: 2011-07-03 | Disposition: A | Discharge status: Home / Self Care | Payer: Managed Care, Other (non HMO) | Source: Ambulatory Visit | Attending: Gynecology | Admitting: Gynecology

## 2011-07-03 ENCOUNTER — Other Ambulatory Visit: Payer: Self-pay | Admitting: Diagnostic Radiology

## 2011-07-03 DIAGNOSIS — R928 Other abnormal and inconclusive findings on diagnostic imaging of breast: Secondary | ICD-10-CM

## 2011-07-03 MED ORDER — GADOBENATE DIMEGLUMINE 529 MG/ML IV SOLN
17.0000 mL | Freq: Once | INTRAVENOUS | Status: AC | PRN
  Administered 2011-07-03: 17 mL via INTRAVENOUS

## 2011-07-09 ENCOUNTER — Telehealth: Payer: Self-pay | Admitting: *Deleted

## 2011-07-09 NOTE — Telephone Encounter (Signed)
Message copied by Aura Camps on Thu Jul 09, 2011  9:18 AM ------      Message from: Ok Edwards      Created: Wed Jul 08, 2011  7:12 PM       Victorino Dike make sure patient has a follow up appointment with general surgeon (Dr Mayford Knife). Let me know after confirmation.

## 2011-07-09 NOTE — Telephone Encounter (Signed)
Pt did speak with Dr. Mayford Knife and has appointment coming up.

## 2011-07-15 ENCOUNTER — Ambulatory Visit (INDEPENDENT_AMBULATORY_CARE_PROVIDER_SITE_OTHER): Payer: Managed Care, Other (non HMO) | Admitting: General Surgery

## 2011-07-15 ENCOUNTER — Encounter (HOSPITAL_BASED_OUTPATIENT_CLINIC_OR_DEPARTMENT_OTHER): Payer: Managed Care, Other (non HMO) | Admitting: Oncology

## 2011-07-15 ENCOUNTER — Other Ambulatory Visit: Payer: Self-pay | Admitting: Oncology

## 2011-07-15 ENCOUNTER — Ambulatory Visit: Payer: Managed Care, Other (non HMO) | Attending: General Surgery | Admitting: Physical Therapy

## 2011-07-15 ENCOUNTER — Encounter (INDEPENDENT_AMBULATORY_CARE_PROVIDER_SITE_OTHER): Payer: Self-pay | Admitting: General Surgery

## 2011-07-15 ENCOUNTER — Ambulatory Visit (HOSPITAL_BASED_OUTPATIENT_CLINIC_OR_DEPARTMENT_OTHER): Payer: Managed Care, Other (non HMO) | Admitting: General Surgery

## 2011-07-15 VITALS — BP 131/84 | HR 83 | Temp 98.5°F | Resp 20 | Ht 63.0 in | Wt 180.6 lb

## 2011-07-15 DIAGNOSIS — Z170 Estrogen receptor positive status [ER+]: Secondary | ICD-10-CM

## 2011-07-15 DIAGNOSIS — C50519 Malignant neoplasm of lower-outer quadrant of unspecified female breast: Secondary | ICD-10-CM

## 2011-07-15 DIAGNOSIS — C50919 Malignant neoplasm of unspecified site of unspecified female breast: Secondary | ICD-10-CM | POA: Insufficient documentation

## 2011-07-15 DIAGNOSIS — M25619 Stiffness of unspecified shoulder, not elsewhere classified: Secondary | ICD-10-CM | POA: Insufficient documentation

## 2011-07-15 DIAGNOSIS — N644 Mastodynia: Secondary | ICD-10-CM | POA: Insufficient documentation

## 2011-07-15 DIAGNOSIS — IMO0001 Reserved for inherently not codable concepts without codable children: Secondary | ICD-10-CM | POA: Insufficient documentation

## 2011-07-15 LAB — CBC WITH DIFFERENTIAL/PLATELET
BASO%: 0.4 % (ref 0.0–2.0)
EOS%: 1.4 % (ref 0.0–7.0)
HCT: 42.7 % (ref 34.8–46.6)
HGB: 14.7 g/dL (ref 11.6–15.9)
LYMPH%: 39.1 % (ref 14.0–49.7)
MCH: 29.1 pg (ref 25.1–34.0)
MCHC: 34.4 g/dL (ref 31.5–36.0)
MCV: 84.6 fL (ref 79.5–101.0)
MONO%: 6.5 % (ref 0.0–14.0)
NEUT#: 2.6 10*3/uL (ref 1.5–6.5)
RBC: 5.05 10*6/uL (ref 3.70–5.45)
RDW: 13.6 % (ref 11.2–14.5)
WBC: 4.9 10*3/uL (ref 3.9–10.3)

## 2011-07-15 LAB — COMPREHENSIVE METABOLIC PANEL
ALT: 18 U/L (ref 0–35)
AST: 19 U/L (ref 0–37)
Alkaline Phosphatase: 57 U/L (ref 39–117)
Glucose, Bld: 83 mg/dL (ref 70–99)
Potassium: 3.8 mEq/L (ref 3.5–5.3)
Sodium: 140 mEq/L (ref 135–145)
Total Bilirubin: 0.5 mg/dL (ref 0.3–1.2)
Total Protein: 7.6 g/dL (ref 6.0–8.3)

## 2011-07-15 NOTE — Patient Instructions (Signed)
DO NOT TAKE ASPIRIN/IBUPROFEN/NAPROXEN WITHIN A WEEK BEFORE SURGERY.  The main risks of surgery are bleeding, infection, damage to other structures, and seroma (accumulation of fluid) under the incision site(s).    These complications may lead to additional procedures such as drainage of seroma/infection.  If cancer is found, you may need other surgeries to obtain negative margins or to take more lymph nodes.   Most women do accumulate fluid in the breast cavity where the specimen was removed. We do not always have to drain this fluid.  If your breast is very tense, painful, or red, then we may need to numb the skin and use a needle to aspirate the fluid.  We do provide patients with a Breast Binder.  The purpose of this is to avoid the use of tape on the sensitive tissue of the breast and to provide some compression to minimize the risk of seroma.  If the binder is uncomfortable, you may find that a tank top with a built-in shelf bra or a loose sports bra works better for you.  I recommend wearing this around the clock for the first 1-2 weeks except in the shower.    You may remove your dressings and may shower 48 hours after surgery.    Many patients have some constipation in the week after surgery due to the narcotics and anesthesia.  You may need over the counter stool softeners or laxatives if you experience difficulty having bowel movements.    If the following occur, call our office at [phone]: If you have a fever over 101 or pain that is severe despite narcotics. If you have redness or drainage at the wound. If you develop persistent nausea or vomiting.  I will follow you back up in 1-4 weeks.    Please submit any paperwork about time off work/insurance forms to the front desk.

## 2011-07-15 NOTE — Progress Notes (Signed)
Chief Complaint  Patient presents with  . Breast Cancer    HISTORY: Pt presented with a palpable mass in 2009.  She had imaging demonstrating duct ectasia, and was recommended follow up in 6 months.  Somehow, that did not happen, but she is not able to feel anything now.  She now has two areas of concern in the L breast that had MR biopsy.  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  She has not had a breast biopsy before.  Her mother had breast cancer at age 71.  Onset of menses was age 64.  She is post menopausal.  She used HRT for 4 years.  She is anxious regarding the diagnosis.  She is accompanied by her husband and step daughter.    Past Medical History  Diagnosis Date  . Alcoholism   . Arthritis   . Personal history of colonic polyps     TUBULAR ADENOMA  . Neuroendocrine cancer   . Hyperlipemia   . Hypertension   . Stroke     history of   . Esophageal reflux   . ADD (attention deficit disorder with hyperactivity)   . Anxiety states   . Depression   . Hiatal hernia   . GERD (gastroesophageal reflux disease)     severe     Past Surgical History  Procedure Date  . Laparoscopic hysterectomy 1995    Current Outpatient Prescriptions  Medication Sig Dispense Refill  . DEXILANT 60 MG capsule TAKE ONE CAPSULE BY MOUTH EVERY DAY  30 capsule  5  . Estradiol Acetate (FEMRING) 0.05 MG/24HR RING Place 1 each vaginally as directed.  1 each  4  . HYDROcodone-acetaminophen (VICODIN) 5-500 MG per tablet Take 1 tablet by mouth every 4 (four) hours as needed for pain.  30 tablet  0  . methocarbamol (ROBAXIN) 500 MG tablet Take 500 mg by mouth every 6 (six) hours as needed.        . methylphenidate (RITALIN) 10 MG tablet Take 10 mg by mouth 2 (two) times daily.        . valACYclovir (VALTREX) 500 MG tablet Take 1 tablet (500 mg total) by mouth 2 (two) times daily. Take by mouth as needed  30 tablet  12     Allergies    Allergen Reactions  . Meloxicam Shortness Of Breath and Nausea Only  . Latex      Family History  Problem Relation Age of Onset  . Breast cancer Mother   . Colon cancer Neg Hx   . Diabetes Father   . Hypertension Father   . Cancer Brother     NEUROENDOCRINE  . Hypertension Brother      History   Social History  . Marital Status: Married    Spouse Name: N/A    Number of Children: N/A  . Years of Education: N/A   Occupational History  . Customer Service    Social History Main Topics  . Smoking status: Former Games developer  . Smokeless tobacco: Never Used  . Alcohol Use: Yes  . Drug Use: No  . Sexually Active: Yes -- Female partner(s)    Birth Control/ Protection: Surgical   Other Topics Concern  . None   Social History Narrative  . None     REVIEW OF SYSTEMS - PERTINENT POSITIVES ONLY: 12 point review of systems negative other than HPI and PMH except for night sweats, poor appetite, and  anxiety.    EXAM: Filed Vitals:   07/15/11 1442  BP: 131/84  Pulse: 83  Temp: 98.5 F (36.9 C)  Resp: 20    Gen:  No acute distress.  Well nourished and well groomed.   Neurological: Alert and oriented to person, place, and time. Coordination normal.  Head: Normocephalic and atraumatic.  Eyes: Conjunctivae are normal. Pupils are equal, round, and reactive to light. No scleral icterus.  Neck: Normal range of motion. Neck supple. No tracheal deviation or thyromegaly present.  Cardiovascular: Normal rate, regular rhythm, normal heart sounds and intact distal pulses.  Exam reveals no gallop and no friction rub.  No murmur heard. Respiratory: Effort normal.  No respiratory distress. No chest wall tenderness. Breath sounds normal.  No wheezes, rales or rhonchi.  GI: Soft. Bowel sounds are normal. The abdomen is soft and nontender.  There is no rebound and no guarding.  Breast:  Normal and symmetric bilaterally.  No palpable masses or nipple discharge.  No skin dimpling.    Musculoskeletal: Normal range of motion. Extremities are nontender.  Lymphadenopathy: No cervical, preauricular, postauricular or axillary adenopathy is present Skin: Skin is warm and dry. No rash noted. No diaphoresis. No erythema. No pallor. No clubbing, cyanosis, or edema.   Psychiatric: Normal mood and affect. Behavior is normal. Judgment and thought content normal.    LABORATORY RESULTS: Available labs are reviewed, CBC OK   RADIOLOGY RESULTS: See E-Chart for most recent results.  Images and reports are reviewed.  ASSESSMENT AND PLAN:   L breast cancer, lower outer quadrant, 12 mm, cT1N0Mx, +/+/- Plan breast conservation surgery. Pt will need papilloma needle localized and cancer localized for surgery. She will need sentinel lymph node biopsy.  The surgical procedure was described to the patient.  I discussed the incision type and location and whether we would need radiology involved on the day of surgery with a wire marker and/or sentinel node.      The risks and benefits of the procedure were described to the patient and he/she wishes to proceed.    We discussed the risks bleeding, infection, damage to other structures, need for further procedures/surgeries.  We discussed the risk of seroma.  The patient was advised if the area in the breast in cancer, we may need to go back to surgery for additional tissue to obtain negative margins or for a lymph node biopsy. The patient was advised that these are the most common complications, but that others can occur as well.  They were advised against taking aspirin or other anti-inflammatory agents/blood thinners the week before surgery.      Maudry Diego MD Surgical Oncology, General and Endocrine Surgery [facility]   Visit Diagnoses: 1. Cancer of lower-outer quadrant of female breast   2. L breast cancer, lower outer quadrant, 12 mm, cT1N0Mx, +/+/-     Primary Care Physician: Loreen Freud, DO, DO

## 2011-07-15 NOTE — Assessment & Plan Note (Signed)
Plan breast conservation surgery. Pt will need papilloma needle localized and cancer localized for surgery. She will need sentinel lymph node biopsy.  The surgical procedure was described to the patient.  I discussed the incision type and location and whether we would need radiology involved on the day of surgery with a wire marker and/or sentinel node.      The risks and benefits of the procedure were described to the patient and he/she wishes to proceed.    We discussed the risks bleeding, infection, damage to other structures, need for further procedures/surgeries.  We discussed the risk of seroma.  The patient was advised if the area in the breast in cancer, we may need to go back to surgery for additional tissue to obtain negative margins or for a lymph node biopsy. The patient was advised that these are the most common complications, but that others can occur as well.  They were advised against taking aspirin or other anti-inflammatory agents/blood thinners the week before surgery.

## 2011-07-16 ENCOUNTER — Other Ambulatory Visit (INDEPENDENT_AMBULATORY_CARE_PROVIDER_SITE_OTHER): Payer: Self-pay | Admitting: General Surgery

## 2011-07-16 ENCOUNTER — Other Ambulatory Visit (INDEPENDENT_AMBULATORY_CARE_PROVIDER_SITE_OTHER): Payer: Self-pay | Admitting: Surgery

## 2011-07-16 DIAGNOSIS — C50912 Malignant neoplasm of unspecified site of left female breast: Secondary | ICD-10-CM

## 2011-07-16 DIAGNOSIS — C50919 Malignant neoplasm of unspecified site of unspecified female breast: Secondary | ICD-10-CM

## 2011-07-17 ENCOUNTER — Encounter: Payer: Managed Care, Other (non HMO) | Admitting: Genetic Counselor

## 2011-07-21 ENCOUNTER — Encounter (HOSPITAL_COMMUNITY)
Admission: RE | Admit: 2011-07-21 | Discharge: 2011-07-21 | Disposition: A | Discharge status: Home / Self Care | Payer: Managed Care, Other (non HMO) | Source: Ambulatory Visit | Attending: General Surgery | Admitting: General Surgery

## 2011-07-21 LAB — CBC
HCT: 42.1 % (ref 36.0–46.0)
MCH: 29.3 pg (ref 26.0–34.0)
MCV: 87 fL (ref 78.0–100.0)
RDW: 13.6 % (ref 11.5–15.5)
WBC: 5.4 10*3/uL (ref 4.0–10.5)

## 2011-07-23 ENCOUNTER — Other Ambulatory Visit (INDEPENDENT_AMBULATORY_CARE_PROVIDER_SITE_OTHER): Payer: Self-pay | Admitting: General Surgery

## 2011-07-23 ENCOUNTER — Ambulatory Visit (HOSPITAL_COMMUNITY)
Admission: RE | Admit: 2011-07-23 | Discharge: 2011-07-23 | Disposition: A | Discharge status: Home / Self Care | Payer: Managed Care, Other (non HMO) | Source: Ambulatory Visit | Attending: General Surgery | Admitting: General Surgery

## 2011-07-23 ENCOUNTER — Ambulatory Visit
Admission: RE | Admit: 2011-07-23 | Discharge: 2011-07-23 | Disposition: A | Discharge status: Home / Self Care | Payer: Managed Care, Other (non HMO) | Source: Ambulatory Visit | Attending: General Surgery | Admitting: General Surgery

## 2011-07-23 DIAGNOSIS — C50919 Malignant neoplasm of unspecified site of unspecified female breast: Secondary | ICD-10-CM

## 2011-07-23 DIAGNOSIS — D0590 Unspecified type of carcinoma in situ of unspecified breast: Secondary | ICD-10-CM | POA: Insufficient documentation

## 2011-07-23 DIAGNOSIS — C50912 Malignant neoplasm of unspecified site of left female breast: Secondary | ICD-10-CM

## 2011-07-23 DIAGNOSIS — C50519 Malignant neoplasm of lower-outer quadrant of unspecified female breast: Secondary | ICD-10-CM | POA: Insufficient documentation

## 2011-07-23 HISTORY — PX: BREAST LUMPECTOMY: SHX2

## 2011-07-23 MED ORDER — TECHNETIUM TC 99M SULFUR COLLOID FILTERED
1.0000 | Freq: Once | INTRAVENOUS | Status: AC | PRN
  Administered 2011-07-23: 1 via INTRADERMAL

## 2011-08-10 ENCOUNTER — Encounter (INDEPENDENT_AMBULATORY_CARE_PROVIDER_SITE_OTHER): Payer: Self-pay | Admitting: General Surgery

## 2011-08-10 ENCOUNTER — Ambulatory Visit (INDEPENDENT_AMBULATORY_CARE_PROVIDER_SITE_OTHER): Payer: Managed Care, Other (non HMO) | Admitting: General Surgery

## 2011-08-10 VITALS — BP 138/96 | HR 60 | Temp 97.2°F | Resp 18 | Ht 62.5 in | Wt 183.1 lb

## 2011-08-10 DIAGNOSIS — C50519 Malignant neoplasm of lower-outer quadrant of unspecified female breast: Secondary | ICD-10-CM

## 2011-08-10 NOTE — Progress Notes (Signed)
HISTORY: Pt doing well post op.  Pt is not having any significant discomfort.  She denies fevers/chills.  She has tapered off on her pain medication.  She is overall feeling well.    PERTINENT REVIEW OF SYSTEMS: Otherwise negative.     EXAM: Head: Normocephalic and atraumatic.  Eyes:  Conjunctivae are normal. Pupils are equal, round, and reactive to light. No scleral icterus.  Neck:  Normal range of motion. Neck supple. No lymphadenopathy Resp: No respiratory distress, normal effort. Breast:  L breast skin incision is healing well.  No sign of cellulitis.  Small seroma present.  No significant bruising.    Neurological: Alert and oriented to person, place, and time. Coordination normal.  Skin: Skin is warm and dry. No rash noted. No diaphoretic. No erythema. No pallor.  Psychiatric: Normal mood and affect. Normal behavior. Judgment and thought content normal.    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    ASSESSMENT AND PLAN:   L breast cancer, lower outer quadrant, 12 mm, pT1cN0M0, s/p BCT 07/23/2011 Margins negative, close margins reviewed to be focal only, and not DCIS. Pt doing well post op. Has appointment with Dr. Mitzi Hansen this week. Follow up with me in 3 months.         Maudry Diego, MD Surgical Oncology, General & Endocrine Surgery [facility]  Loreen Freud, DO, DO Lelon Perla, DO

## 2011-08-10 NOTE — Assessment & Plan Note (Signed)
Margins negative, close margins reviewed to be focal only, and not DCIS. Pt doing well post op. Has appointment with Dr. Mitzi Hansen this week. Follow up with me in 3 months.

## 2011-08-11 NOTE — Op Note (Signed)
NAME:  April Robinson, April Robinson       ACCOUNT NO.:  [id number]  MEDICAL RECORD NO.:  [id number]  LOCATION:  [facility]                         FACILITY:  [facility]  PHYSICIAN:  Almond Lint, MD       DATE OF BIRTH:  27-Sep-1961  DATE OF PROCEDURE:  07/23/2011 DATE OF DISCHARGE:                              OPERATIVE REPORT   PREOPERATIVE DIAGNOSIS:  Left T1C N0 lower outer quadrant breast cancer.  POSTOPERATIVE DIAGNOSIS:  Left T1C N0 lower outer quadrant breast cancer.  PROCEDURE:  Left axillary sentinel lymph node mapping, left sentinel lymph node biopsy, and left needle-localized lumpectomy.  SURGEON:  Almond Lint, MD  ASSISTANT:  None.  ANESTHESIA:  General and local.  FINDINGS:  Clips and specimen and one left axillary sentinel lymph node. Counts per second 805, background count 21.  SPECIMEN:  Left lumpectomy and left axillary sentinel lymph node, which was hot and blue to pathology.  ESTIMATED BLOOD LOSS:  Minimal.  COMPLICATIONS:  None known.  PROCEDURE:  April Robinson was identified in the holding area and taken to the operating room where she was placed in supine on the operating room table.  General anesthesia was induced.  Her left upper arm and chest were prepped and draped in sterile fashion.  Blue dye was injected subareolarly.  This was done after the time-out was performed according to the surgical safety check list.  After we were prepped and draped, the breast specimen was approached first because the Faxitron had not previously been working.  The site for the wires was identified and a curvilinear incision was made over the top of the site of the specimens.  The incision was made with a #10-blade.  The Senn retractors were used to elevate the skin and the curved Mayos were used to take the specimen.  This was taken incorporating both of the wires.  They were bracketing the two specimens.  The wires were pulled through the skin with a tonsil.  Allis clamps  were used to elevate the breast tissue. The specimen was taken in its entirety with the curved Mayo scissors. This was marked with a margin marker paint set.  This was then put in the Faxitron, and the specimen mammogram demonstrated both clips were in the specimen.  The posterior most clip appeared closed and so, an additional posterior margin was taken and marked.  This was packed.    The attention was then directed to the axilla.  The highest point on the NeoProbe was identified.  An incision was made with a #10-blade over the top of this.  The subcutaneous tissue was divided with the cautery.  The clavipectoral fascia was divided with the cautery.  The area of maximum intensity was identified and this was dissected bluntly with the tonsil. The lymph node was elevated with an Allis clamp.  The lymphovascular channels going into this were closed with metal clips.  The specimen was then taken off with the Metzenbaum scissors.  The lymph node was hot and blue and had counts per second of 805 as the max.  The background count was 21.  There were no other areas of high intensity. This was then irrigated and hemostasis was achieved with the cautery. This  was closed with 3-0 Vicryl interrupted deep dermal sutures and 4-0 Monocryl running subcuticular sutures.  The breast was then examined. The large clips were placed with two posterior, one superior, one inferior, one medial and one lateral.  This area was then irrigated and inspected for hemostasis multiple times.  Hemostasis was achieved with the cautery.  The wound was then closed with interrupted 3-0 Vicryl deep dermal sutures and running 4-0 Monocryl subcuticular suture.  The wounds were then cleaned, dried and dressed with Benzoin, gauze, ABDs, and a breast binder.  The patient was awakened from anesthesia and taken to the PACU in stable condition.  Needle, sponge, and instrument counts were correct.     Almond Lint,  MD     FB/MEDQ  D:  07/23/2011  T:  07/23/2011  Job:  [id number]  Electronically Signed by Almond Lint MD on 08/11/2011 12:20:26 PM

## 2011-08-12 ENCOUNTER — Ambulatory Visit
Admission: RE | Admit: 2011-08-12 | Discharge: 2011-08-12 | Disposition: A | Discharge status: Home / Self Care | Payer: Managed Care, Other (non HMO) | Source: Ambulatory Visit | Attending: Radiation Oncology | Admitting: Radiation Oncology

## 2011-08-12 DIAGNOSIS — Z170 Estrogen receptor positive status [ER+]: Secondary | ICD-10-CM | POA: Insufficient documentation

## 2011-08-12 DIAGNOSIS — Z510 Encounter for antineoplastic radiation therapy: Secondary | ICD-10-CM | POA: Insufficient documentation

## 2011-08-12 DIAGNOSIS — C50919 Malignant neoplasm of unspecified site of unspecified female breast: Secondary | ICD-10-CM | POA: Insufficient documentation

## 2011-08-19 ENCOUNTER — Other Ambulatory Visit: Payer: Self-pay | Admitting: Oncology

## 2011-08-19 ENCOUNTER — Encounter (HOSPITAL_BASED_OUTPATIENT_CLINIC_OR_DEPARTMENT_OTHER): Payer: Managed Care, Other (non HMO) | Admitting: Oncology

## 2011-08-19 DIAGNOSIS — Z170 Estrogen receptor positive status [ER+]: Secondary | ICD-10-CM

## 2011-08-19 DIAGNOSIS — C50519 Malignant neoplasm of lower-outer quadrant of unspecified female breast: Secondary | ICD-10-CM

## 2011-08-19 LAB — CBC WITH DIFFERENTIAL/PLATELET
BASO%: 0.4 % (ref 0.0–2.0)
Eosinophils Absolute: 0.1 10*3/uL (ref 0.0–0.5)
HCT: 43.1 % (ref 34.8–46.6)
LYMPH%: 41.2 % (ref 14.0–49.7)
MCHC: 33.9 g/dL (ref 31.5–36.0)
MCV: 87.3 fL (ref 79.5–101.0)
MONO#: 0.3 10*3/uL (ref 0.1–0.9)
NEUT%: 49.5 % (ref 38.4–76.8)
Platelets: 246 10*3/uL (ref 145–400)
WBC: 4.1 10*3/uL (ref 3.9–10.3)

## 2011-08-25 ENCOUNTER — Ambulatory Visit: Payer: Managed Care, Other (non HMO) | Attending: Oncology | Admitting: Physical Therapy

## 2011-08-25 DIAGNOSIS — IMO0001 Reserved for inherently not codable concepts without codable children: Secondary | ICD-10-CM | POA: Insufficient documentation

## 2011-08-25 DIAGNOSIS — M25619 Stiffness of unspecified shoulder, not elsewhere classified: Secondary | ICD-10-CM | POA: Insufficient documentation

## 2011-08-25 DIAGNOSIS — M25519 Pain in unspecified shoulder: Secondary | ICD-10-CM | POA: Insufficient documentation

## 2011-08-26 ENCOUNTER — Ambulatory Visit: Payer: Managed Care, Other (non HMO) | Admitting: Physical Therapy

## 2011-08-31 ENCOUNTER — Ambulatory Visit
Admission: RE | Admit: 2011-08-31 | Discharge: 2011-08-31 | Disposition: A | Discharge status: Home / Self Care | Payer: Managed Care, Other (non HMO) | Source: Ambulatory Visit | Attending: Radiation Oncology | Admitting: Radiation Oncology

## 2011-08-31 ENCOUNTER — Ambulatory Visit: Payer: Managed Care, Other (non HMO) | Attending: Oncology | Admitting: Physical Therapy

## 2011-08-31 DIAGNOSIS — IMO0001 Reserved for inherently not codable concepts without codable children: Secondary | ICD-10-CM | POA: Insufficient documentation

## 2011-08-31 DIAGNOSIS — M25619 Stiffness of unspecified shoulder, not elsewhere classified: Secondary | ICD-10-CM | POA: Insufficient documentation

## 2011-08-31 DIAGNOSIS — M25519 Pain in unspecified shoulder: Secondary | ICD-10-CM | POA: Insufficient documentation

## 2011-09-01 ENCOUNTER — Ambulatory Visit
Admission: RE | Admit: 2011-09-01 | Discharge: 2011-09-01 | Disposition: A | Discharge status: Home / Self Care | Payer: Managed Care, Other (non HMO) | Source: Ambulatory Visit | Attending: Radiation Oncology | Admitting: Radiation Oncology

## 2011-09-02 ENCOUNTER — Ambulatory Visit
Admission: RE | Admit: 2011-09-02 | Discharge: 2011-09-02 | Disposition: A | Discharge status: Home / Self Care | Payer: Managed Care, Other (non HMO) | Source: Ambulatory Visit | Attending: Radiation Oncology | Admitting: Radiation Oncology

## 2011-09-03 ENCOUNTER — Ambulatory Visit
Admission: RE | Admit: 2011-09-03 | Discharge: 2011-09-03 | Disposition: A | Discharge status: Home / Self Care | Payer: Managed Care, Other (non HMO) | Source: Ambulatory Visit | Attending: Radiation Oncology | Admitting: Radiation Oncology

## 2011-09-03 ENCOUNTER — Telehealth: Payer: Self-pay | Admitting: Radiation Oncology

## 2011-09-03 VITALS — Wt 180.3 lb

## 2011-09-03 DIAGNOSIS — C50519 Malignant neoplasm of lower-outer quadrant of unspecified female breast: Secondary | ICD-10-CM

## 2011-09-03 NOTE — Telephone Encounter (Signed)
09/03/11 FM SZ 2 HH INC $52,[phone]

## 2011-09-03 NOTE — Progress Notes (Signed)
[facility] Radiation Oncology Weekly Treatment Note    Name: April Robinson Date: 09/03/2011 MRN: [id number] DOB: October 11, 1961  Status:outpatient    Current dose: 1080  Current fraction:6  Planned dose:6440  Planned fraction:35   MEDICATIONS:Current outpatient prescriptions:DEXILANT 60 MG capsule, TAKE ONE CAPSULE BY MOUTH EVERY DAY, Disp: 30 capsule, Rfl: 5;  HYDROcodone-acetaminophen (VICODIN) 5-500 MG per tablet, Take 1 tablet by mouth every 4 (four) hours as needed for pain., Disp: 30 tablet, Rfl: 0;  IBUPROFEN IB PO, Take by mouth as needed.  , Disp: , Rfl:  valACYclovir (VALTREX) 500 MG tablet, Take 1 tablet (500 mg total) by mouth 2 (two) times daily. Take by mouth as needed, Disp: 30 tablet, Rfl: 12   ALLERGIES: Meloxicam and Latex  NARRATIVE: April Robinson was seen today for weekly treatment management. The chart was checked and port films images were reviewed. Pt doing well. No skin changes.  PHYSICAL EXAMINATION: weight is 180 lb 4.8 oz (81.784 kg).      Mild darkening of skin   ASSESSMENT: Patient tolerating treatments well.    PLAN: Continue treatment as planned.

## 2011-09-04 ENCOUNTER — Ambulatory Visit
Admission: RE | Admit: 2011-09-04 | Discharge: 2011-09-04 | Disposition: A | Discharge status: Home / Self Care | Payer: Managed Care, Other (non HMO) | Source: Ambulatory Visit | Attending: Radiation Oncology | Admitting: Radiation Oncology

## 2011-09-04 ENCOUNTER — Ambulatory Visit: Payer: Managed Care, Other (non HMO) | Admitting: Physical Therapy

## 2011-09-07 ENCOUNTER — Encounter: Payer: Managed Care, Other (non HMO) | Admitting: Physical Therapy

## 2011-09-07 ENCOUNTER — Ambulatory Visit
Admission: RE | Admit: 2011-09-07 | Discharge: 2011-09-07 | Disposition: A | Discharge status: Home / Self Care | Payer: Managed Care, Other (non HMO) | Source: Ambulatory Visit | Attending: Radiation Oncology | Admitting: Radiation Oncology

## 2011-09-08 ENCOUNTER — Ambulatory Visit
Admission: RE | Admit: 2011-09-08 | Discharge: 2011-09-08 | Disposition: A | Discharge status: Home / Self Care | Payer: Managed Care, Other (non HMO) | Source: Ambulatory Visit | Attending: Radiation Oncology | Admitting: Radiation Oncology

## 2011-09-09 ENCOUNTER — Ambulatory Visit
Admission: RE | Admit: 2011-09-09 | Discharge: 2011-09-09 | Disposition: A | Discharge status: Home / Self Care | Payer: Managed Care, Other (non HMO) | Source: Ambulatory Visit | Attending: Radiation Oncology | Admitting: Radiation Oncology

## 2011-09-09 ENCOUNTER — Encounter: Payer: Managed Care, Other (non HMO) | Admitting: Physical Therapy

## 2011-09-10 ENCOUNTER — Ambulatory Visit
Admission: RE | Admit: 2011-09-10 | Discharge: 2011-09-10 | Disposition: A | Discharge status: Home / Self Care | Payer: Managed Care, Other (non HMO) | Source: Ambulatory Visit | Attending: Radiation Oncology | Admitting: Radiation Oncology

## 2011-09-11 ENCOUNTER — Ambulatory Visit
Admission: RE | Admit: 2011-09-11 | Discharge: 2011-09-11 | Disposition: A | Discharge status: Home / Self Care | Payer: Managed Care, Other (non HMO) | Source: Ambulatory Visit | Attending: Radiation Oncology | Admitting: Radiation Oncology

## 2011-09-11 DIAGNOSIS — C50519 Malignant neoplasm of lower-outer quadrant of unspecified female breast: Secondary | ICD-10-CM

## 2011-09-11 NOTE — Progress Notes (Signed)
12/28 radiation treatments to left breast. Slight discoloration. Having hot flashes and fatigue.

## 2011-09-11 NOTE — Progress Notes (Signed)
[facility] Radiation Oncology Weekly Treatment Note    Name: April Robinson Date: 09/11/2011 MRN: [id number] DOB: 1961-05-05  Status:outpatient    Current dose: 2160  Current fraction:12  Planned dose:6440  Planned fraction:35   MEDICATIONS:Current outpatient prescriptions:DEXILANT 60 MG capsule, TAKE ONE CAPSULE BY MOUTH EVERY DAY, Disp: 30 capsule, Rfl: 5;  HYDROcodone-acetaminophen (VICODIN) 5-500 MG per tablet, Take 1 tablet by mouth every 4 (four) hours as needed for pain., Disp: 30 tablet, Rfl: 0;  IBUPROFEN IB PO, Take by mouth as needed.  , Disp: , Rfl:  valACYclovir (VALTREX) 500 MG tablet, Take 1 tablet (500 mg total) by mouth 2 (two) times daily. Take by mouth as needed, Disp: 30 tablet, Rfl: 12   ALLERGIES: Meloxicam and Latex   NARRATIVE: April Robinson was seen today for weekly treatment management. The chart was checked and port films images were reviewed. Pt doing well. Some fatigue and darkening of skin.  PHYSICAL EXAMINATION: weight is 180 lb 11.2 oz (81.965 kg).      Hyperpigmentation - no desquamation   ASSESSMENT: Patient tolerating treatments well.    PLAN: Continue treatment as planned.

## 2011-09-12 ENCOUNTER — Ambulatory Visit
Admission: RE | Admit: 2011-09-12 | Discharge: 2011-09-12 | Disposition: A | Discharge status: Home / Self Care | Payer: Managed Care, Other (non HMO) | Source: Ambulatory Visit | Attending: Radiation Oncology | Admitting: Radiation Oncology

## 2011-09-14 ENCOUNTER — Encounter: Payer: Managed Care, Other (non HMO) | Admitting: Physical Therapy

## 2011-09-14 ENCOUNTER — Ambulatory Visit
Admission: RE | Admit: 2011-09-14 | Discharge: 2011-09-14 | Disposition: A | Discharge status: Home / Self Care | Payer: Managed Care, Other (non HMO) | Source: Ambulatory Visit | Attending: Radiation Oncology | Admitting: Radiation Oncology

## 2011-09-15 ENCOUNTER — Ambulatory Visit
Admission: RE | Admit: 2011-09-15 | Discharge: 2011-09-15 | Disposition: A | Discharge status: Home / Self Care | Payer: Managed Care, Other (non HMO) | Source: Ambulatory Visit | Attending: Radiation Oncology | Admitting: Radiation Oncology

## 2011-09-16 ENCOUNTER — Ambulatory Visit
Admission: RE | Admit: 2011-09-16 | Discharge: 2011-09-16 | Disposition: A | Discharge status: Home / Self Care | Payer: Managed Care, Other (non HMO) | Source: Ambulatory Visit | Attending: Radiation Oncology | Admitting: Radiation Oncology

## 2011-09-16 ENCOUNTER — Encounter: Payer: Managed Care, Other (non HMO) | Admitting: Physical Therapy

## 2011-09-16 VITALS — Wt 181.9 lb

## 2011-09-16 DIAGNOSIS — C50519 Malignant neoplasm of lower-outer quadrant of unspecified female breast: Secondary | ICD-10-CM

## 2011-09-16 NOTE — Progress Notes (Signed)
Some darkening of skin L breast. Pt reported "stinging when she applied deodorant this morning". Reminded her not to apply before tx. Pt verbalized understanding.

## 2011-09-19 NOTE — Progress Notes (Signed)
[facility] Radiation Oncology Weekly Treatment Note    Name: April Robinson Date: 09/19/2011 MRN: [id number] DOB: 1961-08-08  Status:outpatient    Current dose: 2880  Current fraction:16  Planned dose:6440  Planned fraction:35   MEDICATIONS:Current outpatient prescriptions:DEXILANT 60 MG capsule, TAKE ONE CAPSULE BY MOUTH EVERY DAY, Disp: 30 capsule, Rfl: 5;  HYDROcodone-acetaminophen (VICODIN) 5-500 MG per tablet, Take 1 tablet by mouth every 4 (four) hours as needed for pain., Disp: 30 tablet, Rfl: 0;  IBUPROFEN IB PO, Take by mouth as needed.  , Disp: , Rfl:  valACYclovir (VALTREX) 500 MG tablet, Take 1 tablet (500 mg total) by mouth 2 (two) times daily. Take by mouth as needed, Disp: 30 tablet, Rfl: 12   ALLERGIES: Meloxicam and Latex    NARRATIVE: April Robinson was seen today for weekly treatment management. The chart was checked and port films images were reviewed. Pt doing well. Some mild skin changes/ darkening.  PHYSICAL EXAMINATION: weight is 181 lb 14.4 oz (82.509 kg).     Hyperpigmentation - overall skin looks very good  ASSESSMENT: Patient tolerating treatments well.    PLAN: Continue treatment as planned.

## 2011-09-21 ENCOUNTER — Ambulatory Visit
Admission: RE | Admit: 2011-09-21 | Discharge: 2011-09-21 | Disposition: A | Discharge status: Home / Self Care | Payer: Managed Care, Other (non HMO) | Source: Ambulatory Visit | Attending: Radiation Oncology | Admitting: Radiation Oncology

## 2011-09-22 ENCOUNTER — Ambulatory Visit
Admission: RE | Admit: 2011-09-22 | Discharge: 2011-09-22 | Disposition: A | Discharge status: Home / Self Care | Payer: Managed Care, Other (non HMO) | Source: Ambulatory Visit | Attending: Radiation Oncology | Admitting: Radiation Oncology

## 2011-09-23 ENCOUNTER — Ambulatory Visit
Admission: RE | Admit: 2011-09-23 | Discharge: 2011-09-23 | Disposition: A | Discharge status: Home / Self Care | Payer: Managed Care, Other (non HMO) | Source: Ambulatory Visit | Attending: Radiation Oncology | Admitting: Radiation Oncology

## 2011-09-24 ENCOUNTER — Ambulatory Visit
Admission: RE | Admit: 2011-09-24 | Discharge: 2011-09-24 | Disposition: A | Discharge status: Home / Self Care | Payer: Managed Care, Other (non HMO) | Source: Ambulatory Visit | Attending: Radiation Oncology | Admitting: Radiation Oncology

## 2011-09-25 ENCOUNTER — Ambulatory Visit
Admission: RE | Admit: 2011-09-25 | Discharge: 2011-09-25 | Disposition: A | Discharge status: Home / Self Care | Payer: Managed Care, Other (non HMO) | Source: Ambulatory Visit | Attending: Radiation Oncology | Admitting: Radiation Oncology

## 2011-09-28 ENCOUNTER — Ambulatory Visit
Admission: RE | Admit: 2011-09-28 | Discharge: 2011-09-28 | Disposition: A | Discharge status: Home / Self Care | Payer: Managed Care, Other (non HMO) | Source: Ambulatory Visit | Attending: Radiation Oncology | Admitting: Radiation Oncology

## 2011-09-29 ENCOUNTER — Ambulatory Visit
Admission: RE | Admit: 2011-09-29 | Discharge: 2011-09-29 | Disposition: A | Discharge status: Home / Self Care | Payer: Managed Care, Other (non HMO) | Source: Ambulatory Visit | Attending: Radiation Oncology | Admitting: Radiation Oncology

## 2011-09-30 ENCOUNTER — Ambulatory Visit
Admission: RE | Admit: 2011-09-30 | Discharge: 2011-09-30 | Disposition: A | Discharge status: Home / Self Care | Payer: Managed Care, Other (non HMO) | Source: Ambulatory Visit | Attending: Radiation Oncology | Admitting: Radiation Oncology

## 2011-10-01 ENCOUNTER — Ambulatory Visit
Admission: RE | Admit: 2011-10-01 | Discharge: 2011-10-01 | Disposition: A | Discharge status: Home / Self Care | Payer: Managed Care, Other (non HMO) | Source: Ambulatory Visit | Attending: Radiation Oncology | Admitting: Radiation Oncology

## 2011-10-01 ENCOUNTER — Encounter: Payer: Self-pay | Admitting: Radiation Oncology

## 2011-10-01 DIAGNOSIS — C50519 Malignant neoplasm of lower-outer quadrant of unspecified female breast: Secondary | ICD-10-CM

## 2011-10-01 NOTE — Progress Notes (Signed)
[facility] Radiation Oncology Weekly Treatment Note    Name: April Robinson Date: 10/01/2011 MRN: [id number] DOB: 12/28/1960  Status:outpatient    Current dose: 4500  Current fraction:25  Planned dose:6440  Planned fraction:35   MEDICATIONS: Current Outpatient Prescriptions  Medication Sig Dispense Refill  . DEXILANT 60 MG capsule TAKE ONE CAPSULE BY MOUTH EVERY DAY  30 capsule  5  . HYDROcodone-acetaminophen (VICODIN) 5-500 MG per tablet Take 1 tablet by mouth every 4 (four) hours as needed for pain.  30 tablet  0  . IBUPROFEN IB PO Take by mouth as needed.        . valACYclovir (VALTREX) 500 MG tablet Take 1 tablet (500 mg total) by mouth 2 (two) times daily. Take by mouth as needed  30 tablet  12     ALLERGIES: Meloxicam and Latex   LABORATORY DATA:  Lab Results  Component Value Date   WBC 4.1 08/19/2011   HGB 14.6 08/19/2011   HCT 43.1 08/19/2011   MCV 87.3 08/19/2011   PLT 246 08/19/2011   Lab Results  Component Value Date   NA 140 07/15/2011   K 3.8 07/15/2011   CL 102 07/15/2011   CO2 29 07/15/2011   Lab Results  Component Value Date   ALT 18 07/15/2011   AST 19 07/15/2011   ALKPHOS 57 07/15/2011   BILITOT 0.5 07/15/2011      NARRATIVE: April Robinson was seen today for weekly treatment management. The chart was checked and port films images were reviewed. The patient is doing well. No new problems. Occasional aches in breast.  PHYSICAL EXAMINATION: weight is 180 lb 11.2 oz (81.965 kg).     Hyperpigmentation - overll skin looks good  ASSESSMENT: Patient tolerating treatments well.    PLAN: Continue treatment as planned.

## 2011-10-01 NOTE — Progress Notes (Signed)
Patient presents to the clinic today unaccompanied for under treat visit with Dr. Mitzi Hansen. No distress noted. Steady gait noted. Pleasant affect noted. Reinforced skin care. Patient verbalized understanding. Patient reports intermittent aching of the left breast but, denies any today. Patient has no other complaints at this time. Patient denies nausea, vomiting, diarrhea, or constipation. Patient does report an occasional dizzy spell. All findings reported to Dr. Mitzi Hansen.

## 2011-10-02 ENCOUNTER — Ambulatory Visit
Admission: RE | Admit: 2011-10-02 | Discharge: 2011-10-02 | Disposition: A | Discharge status: Home / Self Care | Payer: Managed Care, Other (non HMO) | Source: Ambulatory Visit | Attending: Radiation Oncology | Admitting: Radiation Oncology

## 2011-10-05 ENCOUNTER — Ambulatory Visit
Admission: RE | Admit: 2011-10-05 | Discharge: 2011-10-05 | Disposition: A | Discharge status: Home / Self Care | Payer: Managed Care, Other (non HMO) | Source: Ambulatory Visit | Attending: Radiation Oncology | Admitting: Radiation Oncology

## 2011-10-05 NOTE — Procedures (Signed)
DIAGNOSIS:  Breast cancer.  NARRATIVE:  April Robinson presented for port films prior to beginning her boost treatment.  A shift has been requested with respect to the isocenter placement.  Otherwise, the blocks appropriately delineated the target treatment region.  Therefore, we will make this shift and proceed with her treatment as planned.    ______________________________ Radene Gunning, M.D., Ph.D. JSM/MEDQ  D:  10/04/2011  T:  10/05/2011  Job:  [id number]

## 2011-10-06 ENCOUNTER — Ambulatory Visit
Admission: RE | Admit: 2011-10-06 | Discharge: 2011-10-06 | Disposition: A | Discharge status: Home / Self Care | Payer: Managed Care, Other (non HMO) | Source: Ambulatory Visit | Attending: Radiation Oncology | Admitting: Radiation Oncology

## 2011-10-07 ENCOUNTER — Ambulatory Visit
Admission: RE | Admit: 2011-10-07 | Discharge: 2011-10-07 | Disposition: A | Discharge status: Home / Self Care | Payer: Managed Care, Other (non HMO) | Source: Ambulatory Visit | Attending: Radiation Oncology | Admitting: Radiation Oncology

## 2011-10-08 ENCOUNTER — Encounter: Payer: Self-pay | Admitting: Radiation Oncology

## 2011-10-08 ENCOUNTER — Ambulatory Visit
Admission: RE | Admit: 2011-10-08 | Discharge: 2011-10-08 | Disposition: A | Discharge status: Home / Self Care | Payer: Managed Care, Other (non HMO) | Source: Ambulatory Visit | Attending: Radiation Oncology | Admitting: Radiation Oncology

## 2011-10-08 DIAGNOSIS — C50519 Malignant neoplasm of lower-outer quadrant of unspecified female breast: Secondary | ICD-10-CM

## 2011-10-08 NOTE — Progress Notes (Signed)
Patient presents to the clinic today unaccompanied for under treat visit with Dr. Mitzi Hansen. No distress noted. Steady gait noted. Pleasant affect noted. Patient reports using Radiaplex once a day. Encouraged patient to use Radiaplex at least twice per day. Patient verbalized understanding. Patient reports her left breast is tender. Patient reports intermittent aching left chest wall pain 3 on a scale of 0-10 for which the patient take Motrin OTC.  Patient reports fatigue.

## 2011-10-08 NOTE — Progress Notes (Signed)
[facility] Radiation Oncology Weekly Treatment Note    Name: April Robinson Date: 10/08/2011 MRN: [id number] DOB: 09/09/1961  Status:outpatient    Current dose: 5440  Current fraction:30  Planned dose:6440  Planned fraction:35   MEDICATIONS: Current Outpatient Prescriptions  Medication Sig Dispense Refill  . DEXILANT 60 MG capsule TAKE ONE CAPSULE BY MOUTH EVERY DAY  30 capsule  5  . HYDROcodone-acetaminophen (VICODIN) 5-500 MG per tablet Take 1 tablet by mouth every 4 (four) hours as needed for pain.  30 tablet  0  . IBUPROFEN IB PO Take by mouth as needed.        . valACYclovir (VALTREX) 500 MG tablet Take 1 tablet (500 mg total) by mouth 2 (two) times daily. Take by mouth as needed  30 tablet  12     ALLERGIES: Meloxicam and Latex   LABORATORY DATA:  Lab Results  Component Value Date   WBC 4.1 08/19/2011   HGB 14.6 08/19/2011   HCT 43.1 08/19/2011   MCV 87.3 08/19/2011   PLT 246 08/19/2011   Lab Results  Component Value Date   NA 140 07/15/2011   K 3.8 07/15/2011   CL 102 07/15/2011   CO2 29 07/15/2011   Lab Results  Component Value Date   ALT 18 07/15/2011   AST 19 07/15/2011   ALKPHOS 57 07/15/2011   BILITOT 0.5 07/15/2011      NARRATIVE: April Robinson was seen today for weekly treatment management. The chart was checked and port films images were reviewed. Pt doing well. No new issues. Continued skin irritation.  PHYSICAL EXAMINATION: weight is 181 lb 3.2 oz (82.192 kg).   hyperpigmenation diffusely; no desquamation   ASSESSMENT: Patient tolerating treatments well.    PLAN: Continue treatment as planned.

## 2011-10-09 ENCOUNTER — Ambulatory Visit
Admission: RE | Admit: 2011-10-09 | Discharge: 2011-10-09 | Disposition: A | Discharge status: Home / Self Care | Payer: Managed Care, Other (non HMO) | Source: Ambulatory Visit | Attending: Radiation Oncology | Admitting: Radiation Oncology

## 2011-10-09 NOTE — Procedures (Signed)
DIAGNOSIS:  Breast cancer.  NARRATIVE:  Ms. Sek has undergone complex simulation for her upcoming boost treatment for her diagnosis of left-sided breast cancer. The patient has initially been planned to receive 50.4 Gy using tangent fields and she now will receive a 14 Gy boost at 2 Gy per fraction.  To accomplish this, 2 additional customized blocks have been designed and these correspond to an AP field and an LPO field.  These will target the seroma cavity, which has been contoured, and these will utilized 10 MV photons.  A complex isodose plan is requested to ensure coverage of the seroma adequately.  This will deliver a final total dose of 64.4 Gy.    ______________________________ Radene Gunning, M.D., Ph.D. JSM/MEDQ  D:  10/09/2011  T:  10/09/2011  Job:  [id number]

## 2011-10-12 ENCOUNTER — Ambulatory Visit
Admission: RE | Admit: 2011-10-12 | Discharge: 2011-10-12 | Disposition: A | Discharge status: Home / Self Care | Payer: Managed Care, Other (non HMO) | Source: Ambulatory Visit | Attending: Radiation Oncology | Admitting: Radiation Oncology

## 2011-10-13 ENCOUNTER — Ambulatory Visit
Admission: RE | Admit: 2011-10-13 | Discharge: 2011-10-13 | Disposition: A | Discharge status: Home / Self Care | Payer: Managed Care, Other (non HMO) | Source: Ambulatory Visit | Attending: Radiation Oncology | Admitting: Radiation Oncology

## 2011-10-14 ENCOUNTER — Ambulatory Visit
Admission: RE | Admit: 2011-10-14 | Discharge: 2011-10-14 | Disposition: A | Discharge status: Home / Self Care | Payer: Managed Care, Other (non HMO) | Source: Ambulatory Visit | Attending: Radiation Oncology | Admitting: Radiation Oncology

## 2011-10-15 ENCOUNTER — Ambulatory Visit
Admission: RE | Admit: 2011-10-15 | Discharge: 2011-10-15 | Disposition: A | Discharge status: Home / Self Care | Payer: Managed Care, Other (non HMO) | Source: Ambulatory Visit | Attending: Radiation Oncology | Admitting: Radiation Oncology

## 2011-10-15 DIAGNOSIS — C50519 Malignant neoplasm of lower-outer quadrant of unspecified female breast: Secondary | ICD-10-CM

## 2011-10-15 NOTE — Progress Notes (Signed)
Pt completed today, 1 month f/u card given, a little discomfort breast chest wall this past week, breast tanning and has peeled skin has healed, 9:11 AM

## 2011-10-16 ENCOUNTER — Ambulatory Visit: Payer: Managed Care, Other (non HMO)

## 2011-10-16 NOTE — Progress Notes (Signed)
[facility] Radiation Oncology Weekly Treatment Note    Name: April Robinson Date: 10/16/2011 MRN: [id number] DOB: September 05, 1961  Status: outpatient    Current dose: 6040  Current fraction:35  Planned dose:6040  Planned fraction:35   MEDICATIONS: Current Outpatient Prescriptions  Medication Sig Dispense Refill  . DEXILANT 60 MG capsule TAKE ONE CAPSULE BY MOUTH EVERY DAY  30 capsule  5  . HYDROcodone-acetaminophen (VICODIN) 5-500 MG per tablet Take 1 tablet by mouth every 4 (four) hours as needed for pain.  30 tablet  0  . IBUPROFEN IB PO Take by mouth as needed.        . valACYclovir (VALTREX) 500 MG tablet Take 1 tablet (500 mg total) by mouth 2 (two) times daily. Take by mouth as needed  30 tablet  12     ALLERGIES: Meloxicam and Latex   LABORATORY DATA:  Lab Results  Component Value Date   WBC 4.1 08/19/2011   HGB 14.6 08/19/2011   HCT 43.1 08/19/2011   MCV 87.3 08/19/2011   PLT 246 08/19/2011   Lab Results  Component Value Date   NA 140 07/15/2011   K 3.8 07/15/2011   CL 102 07/15/2011   CO2 29 07/15/2011   Lab Results  Component Value Date   ALT 18 07/15/2011   AST 19 07/15/2011   ALKPHOS 57 07/15/2011   BILITOT 0.5 07/15/2011      NARRATIVE: April Robinson was seen today for weekly treatment management. The chart was checked and port films images were reviewed. Pt doing well. Finishes today. No major change in skin.  PHYSICAL EXAMINATION: weight is 179 lb 12.8 oz (81.557 kg).    Diffuse hyperpigmenation/ dryness; looks good  ASSESSMENT: Completes treatment today. Followup in one month.

## 2011-11-03 ENCOUNTER — Other Ambulatory Visit: Payer: Self-pay | Admitting: Family Medicine

## 2011-11-17 ENCOUNTER — Ambulatory Visit (INDEPENDENT_AMBULATORY_CARE_PROVIDER_SITE_OTHER): Payer: Managed Care, Other (non HMO) | Admitting: General Surgery

## 2011-11-17 ENCOUNTER — Encounter: Payer: Self-pay | Admitting: *Deleted

## 2011-11-17 ENCOUNTER — Encounter (INDEPENDENT_AMBULATORY_CARE_PROVIDER_SITE_OTHER): Payer: Self-pay | Admitting: General Surgery

## 2011-11-17 VITALS — BP 130/84 | HR 84 | Temp 97.4°F | Resp 18 | Ht 62.5 in | Wt 181.0 lb

## 2011-11-17 DIAGNOSIS — C50412 Malignant neoplasm of upper-outer quadrant of left female breast: Secondary | ICD-10-CM | POA: Insufficient documentation

## 2011-11-17 DIAGNOSIS — Z170 Estrogen receptor positive status [ER+]: Secondary | ICD-10-CM | POA: Insufficient documentation

## 2011-11-17 DIAGNOSIS — C50519 Malignant neoplasm of lower-outer quadrant of unspecified female breast: Secondary | ICD-10-CM

## 2011-11-17 NOTE — Assessment & Plan Note (Signed)
Doing well other than some L breast pain.  Advised to use ibuprofen and ice packs or heating pad for L breast discomfort  Tamoxifen for prevention/adjuvant treatment  Follow up with me in 3 months.

## 2011-11-17 NOTE — Patient Instructions (Signed)
OK to work  Ibuprofen with food as needed for L breast pain.  Can use ice pack.  Follow up with me in 3 months.

## 2011-11-17 NOTE — Progress Notes (Signed)
HISTORY: The patient is doing well in general.  She finished her radiation 10/12/2011.  She had an issue a few weeks ago where she fell asleep in her sports bra.  She got a blister on the nipple, and since then has been having pain in the left breast.  She has not had any drainage.  The blister resolved.  She describes the pain as a fullness and aching sensation.     PERTINENT REVIEW OF SYSTEMS: Otherwise negative.   EXAM: Head: Normocephalic and atraumatic.  Eyes:  Conjunctivae are normal. Pupils are equal, round, and reactive to light. No scleral icterus.  Neck:  Normal range of motion. Neck supple. No tracheal deviation present. No thyromegaly present.  Breast:  No masses palpable on either breast.  No cellulitis.  The nipple skin is slightly abraded.   Resp: No respiratory distress, normal effort. Abd:  Abdomen is soft, non distended and non tender. No masses are palpable.  There is no rebound and no guarding.  Neurological: Alert and oriented to person, place, and time. Coordination normal.  Skin: Skin is warm and dry. No rash noted. No diaphoretic. No erythema. No pallor.  Psychiatric: Normal mood and affect. Normal behavior. Judgment and thought content normal.     ASSESSMENT AND PLAN:   L breast cancer, lower outer quadrant, 12 mm, pT1cN0M0, s/p BCT 07/23/2011 Doing well other than some L breast pain.  Advised to use ibuprofen and ice packs or heating pad for L breast discomfort  Tamoxifen for prevention/adjuvant treatment  Follow up with me in 3 months.         Maudry Diego, MD Surgical Oncology, General & Endocrine Surgery [facility]  Loreen Freud, DO, DO Lelon Perla, DO

## 2011-11-18 NOTE — Progress Notes (Signed)
CC:   April Robinson, M.D. April Lint, MD April Perla, DO April Lemon, MD  DIAGNOSIS:  Invasive ductal carcinoma of the left breast.  INDICATIONS FOR THERAPY:  Curative.  TREATMENT DATES:  08/27/2011 to 10/15/2011.  SITE AND DOSE:  April Robinson was treated with a course of whole- breast radiation on the left to a dose of 50.4 Gy at 1.8 Gy per fraction.  This consisted of medial and lateral tangent fields using 6 MeV photons.  A 4 planning technique was used.  The patient then received a boost using a 2 field photon boost technique for an additional 14 Gy at 2 Gy per fraction.  This yielded a final total dose of 64.4 Gy.  The boost fields used 10 MV photons.  A 3D conformal plan was given where the tumor cavity target was contoured in addition to normal structures including the lungs and heart.  NARRATIVE:  Overall, April Robinson did well during the course of her treatment.  She did experience some skin irritation but overall did quite well.  FOLLOWUP APPOINTMENT:  One month.    ______________________________ Radene Gunning, M.D., Ph.D. JSM/MEDQ  D:  11/18/2011  T:  11/18/2011  Job:  [id number]

## 2011-11-20 ENCOUNTER — Encounter: Payer: Self-pay | Admitting: Radiation Oncology

## 2011-11-20 ENCOUNTER — Ambulatory Visit
Admission: RE | Admit: 2011-11-20 | Discharge: 2011-11-20 | Disposition: A | Discharge status: Home / Self Care | Payer: Managed Care, Other (non HMO) | Source: Ambulatory Visit | Attending: Radiation Oncology | Admitting: Radiation Oncology

## 2011-11-20 DIAGNOSIS — C50519 Malignant neoplasm of lower-outer quadrant of unspecified female breast: Secondary | ICD-10-CM

## 2011-11-20 NOTE — Progress Notes (Signed)
Pt states blister on L breast is healed. She had noticed this after wearing sports bra all night; saw Dr Donell Beers  This week.  Still has L breast tenderness, sensitivity. Takes Ibuprofen prn, uses ice pack prn.  Less fatigue; she is doing light exercises.

## 2011-11-20 NOTE — Progress Notes (Signed)
CC:   April Lint, MD April Robinson, M.D. April Perla, DO  DIAGNOSIS:  Invasive ductal carcinoma of the left breast.  INTERVAL HISTORY:  April Robinson returns to clinic today for followup.  She completed her course of adjuvant radiotherapy on 10/15/2011.  She states that she has done quite well since she finished overall.  She has noted some occasional pain and sensitivity in the breast area and has been taking some ibuprofen for this on a p.r.n. basis.  Her fatigue has improved and she is pleased with how her skin has healed up as well.  PHYSICAL EXAMINATION:  Some hyperpigmentation is present in the breast area, but overall this looks quite good with no desquamation.  IMPRESSION AND PLAN:  April Robinson has done very well since she finished treatment approximately 1 month ago.  She has begun tamoxifen through Dr. Darnelle Catalan.  I will have her return to our clinic on a p.r.n. basis.    ______________________________ Radene Gunning, M.D., Ph.D. JSM/MEDQ  D:  11/20/2011  T:  11/20/2011  Job:  [id number]

## 2011-12-17 ENCOUNTER — Ambulatory Visit (HOSPITAL_BASED_OUTPATIENT_CLINIC_OR_DEPARTMENT_OTHER): Payer: Managed Care, Other (non HMO) | Admitting: Oncology

## 2011-12-17 ENCOUNTER — Other Ambulatory Visit: Payer: Managed Care, Other (non HMO) | Admitting: Lab

## 2011-12-17 VITALS — BP 132/86 | HR 99 | Temp 98.6°F | Ht 62.5 in | Wt 179.3 lb

## 2011-12-17 DIAGNOSIS — C50519 Malignant neoplasm of lower-outer quadrant of unspecified female breast: Secondary | ICD-10-CM

## 2011-12-17 LAB — CBC WITH DIFFERENTIAL/PLATELET
BASO%: 0.3 % (ref 0.0–2.0)
EOS%: 1.8 % (ref 0.0–7.0)
HCT: 42.5 % (ref 34.8–46.6)
LYMPH%: 18.1 % (ref 14.0–49.7)
MCH: 29.8 pg (ref 25.1–34.0)
MCHC: 34.4 g/dL (ref 31.5–36.0)
NEUT%: 73.9 % (ref 38.4–76.8)
Platelets: 208 10*3/uL (ref 145–400)

## 2011-12-17 NOTE — Progress Notes (Signed)
ID: April Robinson   DOB: [birth year]  MR#: [id number]  ZHY#:[id number]  HISTORY OF PRESENT ILLNESS: The patient had a questionable palpable finding in the left breast which was evaluated in July 2009 and it was recommended that she return for further evaluation but actually did not show.  She had bilateral diagnostic mammography in March 2011 which showed minimal duct ectasia of the left breast at the location in question.  Again, 31-month interval mammography was recommended and this was repeated August 2012.  There was minimally a more prominent ductal ectasia and MRI was recommended for further evaluation.  This was performed on June 22, 2011, and it showed a 1.2 cm enhancing mass in the posterior aspect of an area of irregular linear enhancement.  Anteriorly to this, there was a 6 mm area which is the area of dilated duct noted by prior evaluation.   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   The tumor was estrogen receptor positive at 72%, progesterone receptor positive at 5%, with a very low MIB1 at 4%.  HER2 was not amplified with a ratio by CISH of 1.10.   INTERVAL HISTORY: She returns today for followup of her breast cancer. Since her last visit here she completed her radiation treatments and started tamoxifen in the first week in January.  REVIEW OF SYSTEMS: She is tolerating the tamoxifen well, with some hot flashes but she says she was having hot flashes even before starting the tamoxifen and they are not really any worse than before. I recall that she underwent hysterectomy in 1993 or so but still has her ovaries in place. She did develop chills, no fever, but a lot of nasal drainage and sneezing over the last 2 days. There is no shortness of breath pleurisy or hemoptysis. She has occasional pains in the  left breast, she feels anxious at times and there i are some family stressors going on right now. Otherwise a detailed review of systems today was noncontributory  PAST MEDICAL HISTORY: Past Medical History  Diagnosis Date  . Alcoholism   . Arthritis   . Personal history of colonic polyps     TUBULAR ADENOMA  . Neuroendocrine cancer   . Hyperlipemia   . Hypertension   . Stroke     history of   . Esophageal reflux   . ADD (attention deficit disorder with hyperactivity)   . Anxiety states   . Depression   . Hiatal hernia   . GERD (gastroesophageal reflux disease)     severe   . IBS (irritable bowel syndrome)   . Breast cancer 2012    L breast, , ER/PR +, Her2 -  . Hx of radiation therapy 08/27/11 to 10/15/11    L breast    PAST SURGICAL HISTORY: Past Surgical History  Procedure Date  . Laparoscopic hysterectomy 1995  . Breast lumpectomy 07/23/11    left breast  . Abdominal hysterectomy   no BSO  FAMILY HISTORY Family History  Problem Relation Age of Onset  . Breast cancer Mother   . Cancer Mother     breast  . Colon cancer Neg Hx   . Diabetes Father   . Hypertension Father   . Cancer Brother     NEUROENDOCRINE  . Hypertension Brother   The patient's father is alive at age 42.  The patient's mother is alive at age 14.  She was diagnosed with breast cancer at age 6.  The patient had 2 brothers; one of them died from a neuroendocrine tumor.  She has 1 sister.  There is no other breast or ovarian cancer in the immediate family.  GYNECOLOGIC HISTORY: She had menarche, age 26.  She had a hysterectomy in 1993 .  She took hormone replacement for at least 2 years.  She is GX P3 with first live birth at age 34.  SOCIAL HISTORY: The patient worked in Clinical biochemist, is currently going to school in Lobbyist.  Her husband, April Robinson, works as a Naval architect.  The patient's children from a prior marriage are:  April Robinson, 34, who lives in [place] and  works as a Estate agent.  April Robinson who works as a Runner, broadcasting/film/video of ages between 4 and 12; she is also a Theatre stage manager.  April Robinson, 29, who studies accounting at [employer].   ADVANCED DIRECTIVES:  HEALTH MAINTENANCE: History  Substance Use Topics  . Smoking status: Former Games developer  . Smokeless tobacco: Never Used  . Alcohol Use: No     Colonoscopy:  PAP:  Bone density:  Lipid panel:  Allergies  Allergen Reactions  . Meloxicam Shortness Of Breath and Nausea Only  . Latex     Current Outpatient Prescriptions  Medication Sig Dispense Refill  . DEXILANT 60 MG capsule TAKE ONE CAPSULE BY MOUTH EVERY DAY  30 capsule  2  . IBUPROFEN IB PO Take by mouth as needed.        . tamoxifen (NOLVADEX) 20 MG tablet Take 20 mg by mouth daily.      . valACYclovir (VALTREX) 500 MG tablet Take 1 tablet (500 mg total) by mouth 2 (two) times daily. Take by mouth as needed  30 tablet  12    OBJECTIVE: Early middle-aged African American woman wearing a mask Filed Vitals:   12/17/11 1037  BP: 132/86  Pulse: 99  Temp: 98.6 F (37 C)     Body mass index is 32.27 kg/(m^2).    ECOG FS: 0  Sclerae unicteric Oropharynx no exudates or lesions No peripheral adenopathy Lungs no rales or rhonchi Heart regular rate and rhythm Abd benign MSK no focal spinal tenderness, no peripheral edema Neuro: nonfocal Breasts: Right breast no suspicious findings; left breast status post lumpectomy and radiation. There is no significant induration under the scar. The nipple is not retracted. There is no evidence of local recurrence.  LAB RESULTS: Lab Results  Component Value Date   WBC 3.6* 12/17/2011   NEUTROABS 2.6 12/17/2011   HGB 14.6 12/17/2011   HCT 42.5 12/17/2011   MCV 86.4 12/17/2011   PLT 208 12/17/2011      Chemistry      Component Value Date/Time   NA 140 07/15/2011 1213   K 3.8 07/15/2011 1213   CL 102 07/15/2011 1213   CO2 29 07/15/2011 1213   BUN 14 07/15/2011 1213     CREATININE 0.93 07/15/2011 1213      Component Value Date/Time   CALCIUM 9.5 07/15/2011 1213   ALKPHOS 57 07/15/2011 1213   AST 19 07/15/2011 1213   ALT 18 07/15/2011 1213   BILITOT 0.5 07/15/2011 1213       Lab Results  Component Value Date   LABCA2 27 07/15/2011    No results found for this basename: INR:1;PROTIME:1 in the last 168 hours  No  results found for this basename: UACOL:1,UAPR:1,USPG:1,UPH:1,UTP:1,UGL:1,UKET:1,UBIL:1,UHGB:1,UNIT:1,UROB:1,ULEU:1,UEPI:1,UWBC:1,URBC:1,UBAC:1,CAST:1,CRYS:1,UCOM:1,BILUA:1 in the last 72 hours   STUDIES: No new results found.  ASSESSMENT: A 51 year old [place] woman status post left lumpectomy and sentinel lymph node biopsy September of 2012 for a T1c N0, Stage IA  invasive ductal carcinoma, grade 2, estrogen and progesterone receptor positive with a very low MIB-1 and no HER-2 amplification. She completed her adjuvant radiation 10/15/2011 and started tamoxifen 10/27/2011.  PLAN: She is doing very well as far as breast cancer is concerned and the plan is to see her in a every 6 month basis until she reaches the 2 year mark at which point we will start seeing her at yearly. We will check her FSH and LH after she's been on tamoxifen 2 years, to help [place] decide whether she is ready to switch to an aromatase inhibitor then. Otherwise she knows to call for any problems that may develop before the next visit   Katalaya Beel C    12/17/2011

## 2011-12-18 ENCOUNTER — Ambulatory Visit (INDEPENDENT_AMBULATORY_CARE_PROVIDER_SITE_OTHER): Payer: Managed Care, Other (non HMO) | Admitting: Women's Health

## 2011-12-18 ENCOUNTER — Encounter: Payer: Self-pay | Admitting: Women's Health

## 2011-12-18 DIAGNOSIS — N898 Other specified noninflammatory disorders of vagina: Secondary | ICD-10-CM

## 2011-12-18 MED ORDER — FLUCONAZOLE 150 MG PO TABS: 150.0000 mg | ORAL_TABLET | Freq: Once | ORAL | Status: AC

## 2011-12-18 MED ORDER — METRONIDAZOLE 0.75 % VA GEL: VAGINAL | Status: AC

## 2011-12-18 NOTE — Patient Instructions (Signed)
Monilial Vaginitis Vaginitis in a soreness, swelling and redness (inflammation) of the vagina and vulva. Monilial vaginitis is not a sexually transmitted infection. CAUSES  Yeast vaginitis is caused by yeast (candida) that is normally found in your vagina. With a yeast infection, the candida has overgrown in number to a point that upsets the chemical balance. SYMPTOMS   White, thick vaginal discharge.   Swelling, itching, redness and irritation of the vagina and possibly the lips of the vagina (vulva).   Burning or painful urination.   Painful intercourse.  DIAGNOSIS  Things that may contribute to monilial vaginitis are:  Postmenopausal and virginal states.   Pregnancy.   Infections.   Being tired, sick or stressed, especially if you had monilial vaginitis in the past.   Diabetes. Good control will help lower the chance.   Birth control pills.   Tight fitting garments.   Using bubble bath, feminine sprays, douches or deodorant tampons.   Taking certain medications that kill germs (antibiotics).   Sporadic recurrence can occur if you become ill.  TREATMENT  Your caregiver will give you medication.  There are several kinds of anti monilial vaginal creams and suppositories specific for monilial vaginitis. For recurrent yeast infections, use a suppository or cream in the vagina 2 times a week, or as directed.   Anti-monilial or steroid cream for the itching or irritation of the vulva may also be used. Get your caregiver's permission.   Painting the vagina with methylene blue solution may help if the monilial cream does not work.   Eating yogurt may help prevent monilial vaginitis.  HOME CARE INSTRUCTIONS   Finish all medication as prescribed.   Do not have sex until treatment is completed or after your caregiver tells you it is okay.   Take warm sitz baths.   Do not douche.   Do not use tampons, especially scented ones.   Wear cotton underwear.   Avoid tight  pants and panty hose.   Tell your sexual partner that you have a yeast infection. They should go to their caregiver if they have symptoms such as mild rash or itching.   Your sexual partner should be treated as well if your infection is difficult to eliminate.   Practice safer sex. Use condoms.   Some vaginal medications cause latex condoms to fail. Vaginal medications that harm condoms are:   Cleocin cream.   Butoconazole (Femstat).   Terconazole (Terazol) vaginal suppository.   Miconazole (Monistat) (may be purchased over the counter).  SEEK MEDICAL CARE IF:   You have a temperature by mouth above 102 F (38.9 C).   The infection is getting worse after 2 days of treatment.   The infection is not getting better after 3 days of treatment.   You develop blisters in or around your vagina.   You develop vaginal bleeding, and it is not your menstrual period.   You have pain when you urinate.   You develop intestinal problems.   You have pain with sexual intercourse.  Document Released: 07/22/2005 Document Revised: 06/24/2011 Document Reviewed: 04/05/2009 [facility] Patient Information 2012 [place], [place].Bacterial Vaginosis Bacterial vaginosis (BV) is a vaginal infection where the normal balance of bacteria in the vagina is disrupted. The normal balance is then replaced by an overgrowth of certain bacteria. There are several different kinds of bacteria that can cause BV. BV is the most common vaginal infection in women of childbearing age. CAUSES   The cause of BV is not fully understood. BV develops  when there is an increase or imbalance of harmful bacteria.   Some activities or behaviors can upset the normal balance of bacteria in the vagina and put women at increased risk including:   Having a new sex partner or multiple sex partners.   Douching.   Using an intrauterine device (IUD) for contraception.   It is not clear what role sexual activity plays in the  development of BV. However, women that have never had sexual intercourse are rarely infected with BV.  Women do not get BV from toilet seats, bedding, swimming pools or from touching objects around them.  SYMPTOMS   Grey vaginal discharge.   A fish-like odor with discharge, especially after sexual intercourse.   Itching or burning of the vagina and vulva.   Burning or pain with urination.   Some women have no signs or symptoms at all.  DIAGNOSIS  Your caregiver must examine the vagina for signs of BV. Your caregiver will perform lab tests and look at the sample of vaginal fluid through a microscope. They will look for bacteria and abnormal cells (clue cells), a pH test higher than 4.5, and a positive amine test all associated with BV.  RISKS AND COMPLICATIONS   Pelvic inflammatory disease (PID).   Infections following gynecology surgery.   Developing HIV.   Developing herpes virus.  TREATMENT  Sometimes BV will clear up without treatment. However, all women with symptoms of BV should be treated to avoid complications, especially if gynecology surgery is planned. Female partners generally do not need to be treated. However, BV may spread between female sex partners so treatment is helpful in preventing a recurrence of BV.   BV may be treated with antibiotics. The antibiotics come in either pill or vaginal cream forms. Either can be used with nonpregnant or pregnant women, but the recommended dosages differ. These antibiotics are not harmful to the baby.   BV can recur after treatment. If this happens, a second round of antibiotics will often be prescribed.   Treatment is important for pregnant women. If not treated, BV can cause a premature delivery, especially for a pregnant woman who had a premature birth in the past. All pregnant women who have symptoms of BV should be checked and treated.   For chronic reoccurrence of BV, treatment with a type of prescribed gel vaginally twice a  week is helpful.  HOME CARE INSTRUCTIONS   Finish all medication as directed by your caregiver.   Do not have sex until treatment is completed.   Tell your sexual partner that you have a vaginal infection. They should see their caregiver and be treated if they have problems, such as a mild rash or itching.   Practice safe sex. Use condoms. Only have 1 sex partner.  PREVENTION  Basic prevention steps can help reduce the risk of upsetting the natural balance of bacteria in the vagina and developing BV:  Do not have sexual intercourse (be abstinent).   Do not douche.   Use all of the medicine prescribed for treatment of BV, even if the signs and symptoms go away.   Tell your sex partner if you have BV. That way, they can be treated, if needed, to prevent reoccurrence.  SEEK MEDICAL CARE IF:   Your symptoms are not improving after 3 days of treatment.   You have increased discharge, pain, or fever.  MAKE SURE YOU:   Understand these instructions.   Will watch your condition.   Will get help   right away if you are not doing well or get worse.  FOR MORE INFORMATION  Division of STD Prevention (DSTDP), Centers for Disease Control and Prevention: SolutionApps.[employer].za American Social Health Association (ASHA): www.ashastd.org  Document Released: 10/12/2005 Document Revised: 06/24/2011 Document Reviewed: 04/04/2009 [facility] Patient Information 2012 [place], [place].

## 2011-12-18 NOTE — Progress Notes (Signed)
Patient ID: April Robinson, female   DOB: 12-May-1961, 51 y.o.   MRN: [id number] Presents with the complaint of vaginal discharge. Denies any urinary symptoms. Of significance she was diagnosed with breast cancer September 2012, had a lumpectomy with radiation treatment and is doing well. Stopped Femring at that time. History of an LAVH for fibroids in 41.  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  Exam: Abdomen soft nontender. External genitalia erythematous at introitus, healed HSV on left labia.Marland Kitchen Speculum exam small amount malodorous discharge. Wet prep positive for amines, clue cells and yeast. Bimanual no CMT or adnexal fullness or tenderness.  BV and yeast  Plan: MetroGel vaginal cream one applicator each bedtime x5 and Diflucan 150 by mouth x1 dose. Instructed to use vaginal lubricant with intercourse, call if no relief of discharge.

## 2012-01-06 ENCOUNTER — Encounter: Payer: Self-pay | Admitting: Women's Health

## 2012-01-06 ENCOUNTER — Ambulatory Visit (INDEPENDENT_AMBULATORY_CARE_PROVIDER_SITE_OTHER): Payer: Managed Care, Other (non HMO) | Admitting: Women's Health

## 2012-01-06 DIAGNOSIS — B49 Unspecified mycosis: Secondary | ICD-10-CM

## 2012-01-06 DIAGNOSIS — B379 Candidiasis, unspecified: Secondary | ICD-10-CM

## 2012-01-06 LAB — WET PREP FOR TRICH, YEAST, CLUE: Clue Cells Wet Prep HPF POC: NONE SEEN

## 2012-01-06 MED ORDER — FLUCONAZOLE 100 MG PO TABS: ORAL_TABLET | ORAL | Status: DC

## 2012-01-06 NOTE — Progress Notes (Signed)
Patient ID: April Robinson, female   DOB: [birth year], 51 y.o.   MRN: [id number] Presents with the complaint of vaginal discharge with itching. Also noted pink, no red or dark bleeding on toilet tissue. Has had a negative colonoscopy in 2012. Denies any urinary symptoms. History of a hysterectomy.  Exam: External genitalia erythematous, speculum exam, vaginal walls erythematous, moderate amount of a curdy discharge was noted, no visible blood. No visible external hemorrhoids, rectal exam no palpable nodules or hemorrhoids. No visible blood on glove. Wet prep positive for many yeast.  Yeast infection  Plan: Diflucan 100 by mouth daily for 3 days and weekly for several weeks. Has had some problems with recurrent yeast/thrush in the past. Aware of yeast prevention. Options of doing home Hemoccult cards, return to GI doctor, report if further bleeding. Since has only seen pink one time will watch at this time will call back if notices further pink discharge or bleeding.

## 2012-01-06 NOTE — Patient Instructions (Signed)
BRCA testing  Candida Infection, Adult A candida infection (also called yeast, fungus and Monilia infection) is an overgrowth of yeast that can occur anywhere on the body. A yeast infection commonly occurs in warm, moist body areas. Usually, the infection remains localized but can spread to become a systemic infection. A yeast infection may be a sign of a more severe disease such as diabetes, leukemia, or AIDS. A yeast infection can occur in both men and women. In women, Candida vaginitis is a vaginal infection. It is one of the most common causes of vaginitis. Men usually do not have symptoms or know they have an infection until other problems develop. Men may find out they have a yeast infection because their sex partner has a yeast infection. Uncircumcised men are more likely to get a yeast infection than circumcised men. This is because the uncircumcised glans is not exposed to air and does not remain as dry as that of a circumcised glans. Older adults may develop yeast infections around dentures. CAUSES  Women  Antibiotics.   Steroid medication taken for a long time.   Being overweight (obese).   Diabetes.   Poor immune condition.   Certain serious medical conditions.   Immune suppressive medications for organ transplant patients.   Chemotherapy.   Pregnancy.   Menstration.   Stress and fatigue.   Intravenous drug use.   Oral contraceptives.   Wearing tight-fitting clothes in the crotch area.   Catching it from a sex partner who has a yeast infection.   Spermicide.   Intravenous, urinary, or other catheters.  Men  Catching it from a sex partner who has a yeast infection.   Having oral or anal sex with a person who has the infection.   Spermicide.   Diabetes.   Antibiotics.   Poor immune system.   Medications that suppress the immune system.   Intravenous drug use.   Intravenous, urinary, or other catheters.  SYMPTOMS  Women  Thick, white vaginal  discharge.   Vaginal itching.   Redness and swelling in and around the vagina.   Irritation of the lips of the vagina and perineum.   Blisters on the vaginal lips and perineum.   Painful sexual intercourse.   Low blood sugar (hypoglycemia).   Painful urination.   Bladder infections.   Intestinal problems such as constipation, indigestion, bad breath, bloating, increase in gas, diarrhea, or loose stools.  Men  Men may develop intestinal problems such as constipation, indigestion, bad breath, bloating, increase in gas, diarrhea, or loose stools.   Dry, cracked skin on the penis with itching or discomfort.   Jock itch.   Dry, flaky skin.   Athlete's foot.   Hypoglycemia.  DIAGNOSIS  Women  A history and an exam are performed.   The discharge may be examined under a microscope.   A culture may be taken of the discharge.  Men  A history and an exam are performed.   Any discharge from the penis or areas of cracked skin will be looked at under the microscope and cultured.   Stool samples may be cultured.  TREATMENT  Women  Vaginal antifungal suppositories and creams.   Medicated creams to decrease irritation and itching on the outside of the vagina.   Warm compresses to the perineal area to decrease swelling and discomfort.   Oral antifungal medications.   Medicated vaginal suppositories or cream for repeated or recurrent infections.   Wash and dry the irritation areas before applying the  cream.   Eating yogurt with lactobacillus may help with prevention and treatment.   Sometimes painting the vagina with gentian violet solution may help if creams and suppositories do not work.  Men  Antifungal creams and oral antifungal medications.   Sometimes treatment must continue for 30 days after the symptoms go away to prevent recurrence.  HOME CARE INSTRUCTIONS  Women  Use cotton underwear and avoid tight-fitting clothing.   Avoid colored, scented toilet  paper and deodorant tampons or pads.   Do not douche.   Keep your diabetes under control.   Finish all the prescribed medications.   Keep your skin clean and dry.   Consume milk or yogurt with lactobacillus active culture regularly. If you get frequent yeast infections and think that is what the infection is, there are over-the-counter medications that you can get. If the infection does not show healing in 3 days, talk to your caregiver.   Tell your sex partner you have a yeast infection. Your partner may need treatment also, especially if your infection does not clear up or recurs.  Men  Keep your skin clean and dry.   Keep your diabetes under control.   Finish all prescribed medications.   Tell your sex partner that you have a yeast infection so they can be treated if necessary.  SEEK MEDICAL CARE IF:   Your symptoms do not clear up or worsen in one week after treatment.   You have an oral temperature above 102 F (38.9 C).   You have trouble swallowing or eating for a prolonged time.   You develop blisters on and around your vagina.   You develop vaginal bleeding and it is not your menstrual period.   You develop abdominal pain.   You develop intestinal problems as mentioned above.   You get weak or lightheaded.   You have painful or increased urination.   You have pain during sexual intercourse.  MAKE SURE YOU:   Understand these instructions.   Will watch your condition.   Will get help right away if you are not doing well or get worse.  Document Released: 11/19/2004 Document Revised: 10/01/2011 Document Reviewed: 03/03/2010 [facility] Patient Information 2012 [place], [place].

## 2012-02-03 ENCOUNTER — Other Ambulatory Visit: Payer: Self-pay | Admitting: Family Medicine

## 2012-03-15 ENCOUNTER — Other Ambulatory Visit: Payer: Self-pay | Admitting: Family Medicine

## 2012-04-05 ENCOUNTER — Ambulatory Visit (INDEPENDENT_AMBULATORY_CARE_PROVIDER_SITE_OTHER): Payer: Managed Care, Other (non HMO) | Admitting: Family Medicine

## 2012-04-05 ENCOUNTER — Encounter: Payer: Self-pay | Admitting: Family Medicine

## 2012-04-05 VITALS — BP 120/70 | HR 69 | Temp 98.1°F | Wt 178.0 lb

## 2012-04-05 DIAGNOSIS — M65839 Other synovitis and tenosynovitis, unspecified forearm: Secondary | ICD-10-CM

## 2012-04-05 DIAGNOSIS — M778 Other enthesopathies, not elsewhere classified: Secondary | ICD-10-CM

## 2012-04-05 DIAGNOSIS — M654 Radial styloid tenosynovitis [de Quervain]: Secondary | ICD-10-CM | POA: Insufficient documentation

## 2012-04-05 MED ORDER — PREDNISONE 10 MG PO TABS: ORAL_TABLET | ORAL | Status: DC

## 2012-04-05 NOTE — Progress Notes (Signed)
  Subjective:    Patient ID: April Robinson, female    DOB: 09-28-1961, 51 y.o.   MRN: [id number]  HPI Pt here c/o pain R thumb-- from working on computer all the time.  Pain is along 1st metacarpal / phlange.  Pt is wearing thumb splint.  Symptoms x 1 week.  Review of Systems    as above Objective:   Physical Exam  Constitutional: She is oriented to person, place, and time. She appears well-developed and well-nourished.  Musculoskeletal: She exhibits tenderness.       Pain R hand 1st metacarpal phlange---with dec strength  Neurological: She is alert and oriented to person, place, and time.  Psychiatric: She has a normal mood and affect. Her behavior is normal. Judgment and thought content normal.          Assessment & Plan:

## 2012-04-05 NOTE — Assessment & Plan Note (Signed)
Thumb spica splint pred taper Will refer to hand surgeon if no relief

## 2012-04-05 NOTE — Patient Instructions (Signed)
De Quervain's Disease  De Quervain's disease is a condition often seen in racquet sports where there is a soreness (inflammation) in the cord like structures (tendons) which attach muscle to bone on the thumb side of the wrist. There may be a tightening of the tissuesaround the tendons. This condition is often helped by giving up or modifying the activity which caused it. When conservative treatment does not help, surgery may be required. Conservative treatment could include changes in the activity which brought about the problem or made it worse. Anti-inflammatory medications and injections may be used to help decrease the inflammation and help with pain control. Your caregiver will help you determine which is best for you.  DIAGNOSIS   Often the diagnosis (learning what is wrong) can be made by examination. Sometimes x-rays are required.  HOME CARE INSTRUCTIONS    Apply ice to the sore area for 15 to 20 minutes, 3 to 4 times per day while awake. Put the ice in a plastic bag and place a towel between the bag of ice and your skin. This is especially helpful if it can be done after all activities involving the sore wrist.   Temporary splinting may help.   Only take over-the-counter or prescription medicines for pain, discomfort or fever as directed by your caregiver.  SEEK MEDICAL CARE IF:    Pain relief is not obtained with medications, or if you have increasing pain and seem to be getting worse rather than better.  MAKE SURE YOU:    Understand these instructions.   Will watch your condition.   Will get help right away if you are not doing well or get worse.  Document Released: 07/07/2001 Document Revised: 10/01/2011 Document Reviewed: 10/12/2005  ExitCare Patient Information 2012 [employer].

## 2012-04-14 ENCOUNTER — Other Ambulatory Visit: Payer: Self-pay | Admitting: Family Medicine

## 2012-04-14 MED ORDER — DEXLANSOPRAZOLE 60 MG PO CPDR: 60.0000 mg | DELAYED_RELEASE_CAPSULE | Freq: Every day | ORAL | Status: DC

## 2012-04-14 NOTE — Telephone Encounter (Signed)
refill dexilant dr 60 mg capsule #30, take one capsule by mouth ecery day, last fill 5.21.13 *Indicated OV due NOW* pat., seen for thumb pain 6.11.13

## 2012-04-15 ENCOUNTER — Ambulatory Visit (INDEPENDENT_AMBULATORY_CARE_PROVIDER_SITE_OTHER): Payer: Managed Care, Other (non HMO) | Admitting: Family Medicine

## 2012-04-15 ENCOUNTER — Encounter: Payer: Self-pay | Admitting: Family Medicine

## 2012-04-15 VITALS — BP 122/72 | HR 73 | Temp 98.2°F | Wt 178.0 lb

## 2012-04-15 DIAGNOSIS — Z8742 Personal history of other diseases of the female genital tract: Secondary | ICD-10-CM

## 2012-04-15 DIAGNOSIS — K219 Gastro-esophageal reflux disease without esophagitis: Secondary | ICD-10-CM

## 2012-04-15 DIAGNOSIS — B379 Candidiasis, unspecified: Secondary | ICD-10-CM

## 2012-04-15 DIAGNOSIS — B49 Unspecified mycosis: Secondary | ICD-10-CM

## 2012-04-15 MED ORDER — FLUCONAZOLE 100 MG PO TABS: ORAL_TABLET | ORAL | Status: DC

## 2012-04-15 NOTE — Progress Notes (Signed)
  Subjective:    Patient ID: April Robinson, female    DOB: December 28, 1960, 51 y.o.   MRN: [id number]  HPI Pt here for med refills only.  She will schedule cpe.  No complaints.    Review of Systems As above    Objective:   Physical Exam  Constitutional: She is oriented to person, place, and time. She appears well-developed and well-nourished.  Cardiovascular: Normal rate, regular rhythm, normal heart sounds and intact distal pulses.   No murmur heard. Pulmonary/Chest: Effort normal and breath sounds normal.  Musculoskeletal: Normal range of motion. She exhibits no edema and no tenderness.  Neurological: She is alert and oriented to person, place, and time.  Psychiatric: She has a normal mood and affect. Her behavior is normal. Judgment and thought content normal.          Assessment & Plan:

## 2012-04-15 NOTE — Patient Instructions (Signed)
Diet for GERD or PUD Nutrition therapy can help ease the discomfort of gastroesophageal reflux disease (GERD) and peptic ulcer disease (PUD).  HOME CARE INSTRUCTIONS   Eat your meals slowly, in a relaxed setting.   Eat 5 to 6 small meals per day.   If a food causes distress, stop eating it for a period of time.  FOODS TO AVOID  Coffee, regular or decaffeinated.   Cola beverages, regular or low calorie.   Tea, regular or decaffeinated.   Pepper.   Cocoa.   High fat foods, including meats.   Butter, margarine, hydrogenated oil (trans fats).   Peppermint or spearmint (if you have GERD).   Fruits and vegetables if not tolerated.   Alcohol.   Nicotine (smoking or chewing). This is one of the most potent stimulants to acid production in the gastrointestinal tract.   Any food that seems to aggravate your condition.  If you have questions regarding your diet, ask your caregiver or a registered dietitian. TIPS  Lying flat may make symptoms worse. Keep the head of your bed raised 6 to 9 inches (15 to 23 cm) by using a foam wedge or blocks under the legs of the bed.   Do not lay down until 3 hours after eating a meal.   Daily physical activity may help reduce symptoms.  MAKE SURE YOU:   Understand these instructions.   Will watch your condition.   Will get help right away if you are not doing well or get worse.  Document Released: 10/12/2005 Document Revised: 10/01/2011 Document Reviewed: 08/28/2011 ExitCare Patient Information 2012 [employer]. 

## 2012-04-15 NOTE — Assessment & Plan Note (Signed)
Refill diflucan 

## 2012-04-15 NOTE — Assessment & Plan Note (Signed)
dexilant daily

## 2012-04-27 ENCOUNTER — Encounter (HOSPITAL_COMMUNITY): Payer: Self-pay | Admitting: Emergency Medicine

## 2012-04-27 ENCOUNTER — Emergency Department (HOSPITAL_COMMUNITY)
Admission: EM | Admit: 2012-04-27 | Discharge: 2012-04-28 | Disposition: A | Discharge status: Home / Self Care | Payer: Managed Care, Other (non HMO) | Attending: Emergency Medicine | Admitting: Emergency Medicine

## 2012-04-27 DIAGNOSIS — Z853 Personal history of malignant neoplasm of breast: Secondary | ICD-10-CM | POA: Insufficient documentation

## 2012-04-27 DIAGNOSIS — E785 Hyperlipidemia, unspecified: Secondary | ICD-10-CM | POA: Insufficient documentation

## 2012-04-27 DIAGNOSIS — Z8739 Personal history of other diseases of the musculoskeletal system and connective tissue: Secondary | ICD-10-CM | POA: Insufficient documentation

## 2012-04-27 DIAGNOSIS — K529 Noninfective gastroenteritis and colitis, unspecified: Secondary | ICD-10-CM

## 2012-04-27 DIAGNOSIS — F988 Other specified behavioral and emotional disorders with onset usually occurring in childhood and adolescence: Secondary | ICD-10-CM | POA: Insufficient documentation

## 2012-04-27 DIAGNOSIS — K5289 Other specified noninfective gastroenteritis and colitis: Secondary | ICD-10-CM | POA: Insufficient documentation

## 2012-04-27 DIAGNOSIS — K589 Irritable bowel syndrome without diarrhea: Secondary | ICD-10-CM | POA: Insufficient documentation

## 2012-04-27 LAB — CBC WITH DIFFERENTIAL/PLATELET
HCT: 42.3 % (ref 36.0–46.0)
Hemoglobin: 14.5 g/dL (ref 12.0–15.0)
Lymphocytes Relative: 10 % — ABNORMAL LOW (ref 12–46)
Lymphs Abs: 0.7 10*3/uL (ref 0.7–4.0)
Monocytes Absolute: 0.4 10*3/uL (ref 0.1–1.0)
Monocytes Relative: 5 % (ref 3–12)
Neutro Abs: 5.7 10*3/uL (ref 1.7–7.7)
Neutrophils Relative %: 85 % — ABNORMAL HIGH (ref 43–77)
RBC: 4.98 MIL/uL (ref 3.87–5.11)

## 2012-04-27 LAB — LIPASE, BLOOD: Lipase: 26 U/L (ref 11–59)

## 2012-04-27 LAB — URINE MICROSCOPIC-ADD ON

## 2012-04-27 LAB — COMPREHENSIVE METABOLIC PANEL
Albumin: 3.8 g/dL (ref 3.5–5.2)
Alkaline Phosphatase: 51 U/L (ref 39–117)
BUN: 11 mg/dL (ref 6–23)
CO2: 24 mEq/L (ref 19–32)
Chloride: 106 mEq/L (ref 96–112)
GFR calc Af Amer: 85 mL/min — ABNORMAL LOW (ref >90)
GFR calc non Af Amer: 73 mL/min — ABNORMAL LOW (ref >90)
Glucose, Bld: 90 mg/dL (ref 70–99)
Potassium: 4 mEq/L (ref 3.5–5.1)
Total Bilirubin: 0.5 mg/dL (ref 0.3–1.2)

## 2012-04-27 LAB — URINALYSIS, ROUTINE W REFLEX MICROSCOPIC
Bilirubin Urine: NEGATIVE
Ketones, ur: NEGATIVE mg/dL
Nitrite: NEGATIVE
Protein, ur: NEGATIVE mg/dL
pH: 6 (ref 5.0–8.0)

## 2012-04-27 MED ORDER — MORPHINE SULFATE 4 MG/ML IJ SOLN
4.0000 mg | Freq: Once | INTRAMUSCULAR | Status: AC
  Administered 2012-04-27: 4 mg via INTRAVENOUS
  Filled 2012-04-27: qty 1

## 2012-04-27 MED ORDER — SODIUM CHLORIDE 0.9 % IV BOLUS (SEPSIS)
1000.0000 mL | Freq: Once | INTRAVENOUS | Status: AC
  Administered 2012-04-27: 1000 mL via INTRAVENOUS

## 2012-04-27 MED ORDER — PROMETHAZINE HCL 25 MG PO TABS: 25.0000 mg | ORAL_TABLET | Freq: Four times a day (QID) | ORAL | Status: DC | PRN

## 2012-04-27 MED ORDER — ONDANSETRON HCL 4 MG/2ML IJ SOLN
4.0000 mg | Freq: Once | INTRAMUSCULAR | Status: AC
  Administered 2012-04-27: 4 mg via INTRAVENOUS
  Filled 2012-04-27: qty 2

## 2012-04-27 MED ORDER — PANTOPRAZOLE SODIUM 40 MG IV SOLR
40.0000 mg | Freq: Once | INTRAVENOUS | Status: AC
  Administered 2012-04-27: 40 mg via INTRAVENOUS
  Filled 2012-04-27: qty 40

## 2012-04-27 NOTE — ED Provider Notes (Signed)
History     CSN: [id number]  Arrival date & time 04/27/12  4098   First MD Initiated Contact with Patient 04/27/12 2000      No chief complaint on file.   (Consider location/radiation/quality/duration/timing/severity/associated sxs/prior treatment) HPI  51 year old female with history of GERD, history of alcoholism, and history of irritable bowel syndrome presents complaining of abdominal pain with associate nausea vomiting and diarrhea. Patient states around today today she has been having crampy epigastric abdominal pain with associated nausea and nonbloody non-bilious vomitus with associated nonbloody diarrhea. Patient has no appetite. She does have history of IBS but usually not associated with vomiting.  She denies fever, cp, sob, back pain, urinary sxs, or rash.  Pt believes this is due to eating out at [facility] yesterday and think this is food poisoning.  Denies recent alcohol use or rec drug use.    Past Medical History  Diagnosis Date  . Alcoholism   . Arthritis   . Personal history of colonic polyps     TUBULAR ADENOMA  . Neuroendocrine cancer   . Hyperlipemia   . Esophageal reflux   . ADD (attention deficit disorder with hyperactivity)   . Anxiety states   . Depression   . Hiatal hernia   . GERD (gastroesophageal reflux disease)     severe   . IBS (irritable bowel syndrome)   . Breast cancer 2012    L breast, , ER/PR +, Her2 -  . Hx of radiation therapy 08/27/11 to 10/15/11    L breast    Past Surgical History  Procedure Date  . Laparoscopic hysterectomy 1995  . Abdominal hysterectomy   . Breast lumpectomy 07/23/11    left breast    Family History  Problem Relation Age of Onset  . Breast cancer Mother   . Cancer Mother     breast  . Colon cancer Neg Hx   . Diabetes Father   . Hypertension Father   . Cancer Brother     NEUROENDOCRINE  . Hypertension Brother     History  Substance Use Topics  . Smoking status: Former Games developer  . Smokeless tobacco: Never  Used  . Alcohol Use: No    OB History    Grav Para Term Preterm Abortions TAB SAB Ect Mult Living                  Review of Systems  All other systems reviewed and are negative.    Allergies  Meloxicam; Latex; and Miconazole nitrate  Home Medications   Current Outpatient Rx  Name Route Sig Dispense Refill  . DEXLANSOPRAZOLE 60 MG PO CPDR Oral Take 1 capsule (60 mg total) by mouth daily. 30 capsule 5  . FLUCONAZOLE 100 MG PO TABS  100 mg daily.    . IBUPROFEN IB PO Oral Take by mouth as needed.      Marland Kitchen TAMOXIFEN CITRATE 20 MG PO TABS Oral Take 20 mg by mouth daily.    Marland Kitchen VALACYCLOVIR HCL 500 MG PO TABS Oral Take 500 mg by mouth 2 (two) times daily. Take by mouth as needed fare up      BP 115/80  Pulse 91  Temp 98.6 F (37 C)  Resp 18  SpO2 100%  Physical Exam  Nursing note and vitals reviewed. Constitutional: She appears well-developed and well-nourished. No distress.       Awake, alert, nontoxic appearance  HENT:  Head: Atraumatic.  Mouth/Throat: Oropharynx is clear and moist.  Eyes: Conjunctivae are  normal. Right eye exhibits no discharge. Left eye exhibits no discharge.  Neck: Neck supple.  Cardiovascular: Normal rate and regular rhythm.   Pulmonary/Chest: Effort normal. No respiratory distress. She exhibits no tenderness.  Abdominal: Soft. There is no tenderness. There is no rebound.       Mild epigastric tenderness without rebound or guarding. Negative Murphy's sign. No McBurney's point  Musculoskeletal: She exhibits no edema and no tenderness.       ROM appears intact, no obvious focal weakness  Neurological:       Mental status and motor strength appears intact  Skin: No rash noted.  Psychiatric: She has a normal mood and affect.    ED Course  Procedures (including critical care time)  Labs Reviewed - No data to display No results found.   No diagnosis found.  Results for orders placed during the hospital encounter of 04/27/12  CBC WITH  DIFFERENTIAL      Component Value Range   WBC 6.7  4.0 - 10.5 K/uL   RBC 4.98  3.87 - 5.11 MIL/uL   Hemoglobin 14.5  12.0 - 15.0 g/dL   HCT 40.9  81.1 - 91.4 %   MCV 84.9  78.0 - 100.0 fL   MCH 29.1  26.0 - 34.0 pg   MCHC 34.3  30.0 - 36.0 g/dL   RDW 78.2  95.6 - 21.3 %   Platelets 214  150 - 400 K/uL   Neutrophils Relative 85 (*) 43 - 77 %   Neutro Abs 5.7  1.7 - 7.7 K/uL   Lymphocytes Relative 10 (*) 12 - 46 %   Lymphs Abs 0.7  0.7 - 4.0 K/uL   Monocytes Relative 5  3 - 12 %   Monocytes Absolute 0.4  0.1 - 1.0 K/uL   Eosinophils Relative 0  0 - 5 %   Eosinophils Absolute 0.0  0.0 - 0.7 K/uL   Basophils Relative 0  0 - 1 %   Basophils Absolute 0.0  0.0 - 0.1 K/uL  COMPREHENSIVE METABOLIC PANEL      Component Value Range   Sodium 140  135 - 145 mEq/L   Potassium 4.0  3.5 - 5.1 mEq/L   Chloride 106  96 - 112 mEq/L   CO2 24  19 - 32 mEq/L   Glucose, Bld 90  70 - 99 mg/dL   BUN 11  6 - 23 mg/dL   Creatinine, Ser 0.86  0.50 - 1.10 mg/dL   Calcium 9.6  8.4 - 57.8 mg/dL   Total Protein 7.1  6.0 - 8.3 g/dL   Albumin 3.8  3.5 - 5.2 g/dL   AST 17  0 - 37 U/L   ALT 16  0 - 35 U/L   Alkaline Phosphatase 51  39 - 117 U/L   Total Bilirubin 0.5  0.3 - 1.2 mg/dL   GFR calc non Af Amer 73 (*) >90 mL/min   GFR calc Af Amer 85 (*) >90 mL/min  LIPASE, BLOOD      Component Value Range   Lipase 26  11 - 59 U/L  URINALYSIS, ROUTINE W REFLEX MICROSCOPIC      Component Value Range   Color, Urine YELLOW  YELLOW   APPearance CLEAR  CLEAR   Specific Gravity, Urine 1.012  1.005 - 1.030   pH 6.0  5.0 - 8.0   Glucose, UA NEGATIVE  NEGATIVE mg/dL   Hgb urine dipstick NEGATIVE  NEGATIVE   Bilirubin Urine NEGATIVE  NEGATIVE  Ketones, ur NEGATIVE  NEGATIVE mg/dL   Protein, ur NEGATIVE  NEGATIVE mg/dL   Urobilinogen, UA 0.2  0.0 - 1.0 mg/dL   Nitrite NEGATIVE  NEGATIVE   Leukocytes, UA TRACE (*) NEGATIVE  PREGNANCY, URINE      Component Value Range   Preg Test, Ur NEGATIVE  NEGATIVE  URINE  MICROSCOPIC-ADD ON      Component Value Range   Squamous Epithelial / LPF RARE  RARE   Bacteria, UA FEW (*) RARE   Urine-Other MUCOUS PRESENT     1.gastroenteritis  MDM  crampy epigastric pain with associated n/v/d.  Sxs suggestive of gastroenteritis.  Low suspicion for cardiopulmonary disease.     10:38 PM The patient felt much better after IV fluid hydration. She is able to tolerates by mouth. Her workup today has been unremarkable. Patient recommend eating bland food, bowel rest, and followup with Dr. for further evaluation. Patient voiced understanding and agrees with plan.       Fayrene Helper, PA-C 04/27/12 2239

## 2012-04-27 NOTE — ED Provider Notes (Signed)
Medical screening examination/treatment/procedure(s) were performed by non-physician practitioner and as supervising physician I was immediately available for consultation/collaboration.  Juliet Rude. Rubin Payor, MD 04/27/12 6578

## 2012-04-27 NOTE — ED Notes (Signed)
Pt presents w/ epigastric discomfort and n/v/d that began this afternoon - pt concerned she may have "food poisoning," pt denies any fever or discoloration of emesis or stool.

## 2012-04-27 NOTE — ED Notes (Signed)
Pt c/o of vomiting, diarrhea and nausea that started at 1:30pm today. Pt reports chills and stomach pain. Pt in no acute distress at this time.

## 2012-05-25 ENCOUNTER — Other Ambulatory Visit: Payer: Self-pay | Admitting: *Deleted

## 2012-05-25 DIAGNOSIS — Z853 Personal history of malignant neoplasm of breast: Secondary | ICD-10-CM

## 2012-06-06 ENCOUNTER — Ambulatory Visit
Admission: RE | Admit: 2012-06-06 | Discharge: 2012-06-06 | Disposition: A | Discharge status: Home / Self Care | Payer: Managed Care, Other (non HMO) | Source: Ambulatory Visit | Attending: Gynecology | Admitting: Gynecology

## 2012-06-06 DIAGNOSIS — Z853 Personal history of malignant neoplasm of breast: Secondary | ICD-10-CM

## 2012-06-23 ENCOUNTER — Ambulatory Visit (HOSPITAL_BASED_OUTPATIENT_CLINIC_OR_DEPARTMENT_OTHER): Payer: Managed Care, Other (non HMO) | Admitting: Physician Assistant

## 2012-06-23 ENCOUNTER — Other Ambulatory Visit (HOSPITAL_BASED_OUTPATIENT_CLINIC_OR_DEPARTMENT_OTHER): Payer: Managed Care, Other (non HMO) | Admitting: Lab

## 2012-06-23 ENCOUNTER — Encounter: Payer: Self-pay | Admitting: Physician Assistant

## 2012-06-23 ENCOUNTER — Telehealth: Payer: Self-pay | Admitting: *Deleted

## 2012-06-23 VITALS — BP 116/82 | HR 93 | Temp 98.2°F | Resp 20 | Ht 62.5 in | Wt 178.7 lb

## 2012-06-23 DIAGNOSIS — C50519 Malignant neoplasm of lower-outer quadrant of unspecified female breast: Secondary | ICD-10-CM

## 2012-06-23 DIAGNOSIS — C50919 Malignant neoplasm of unspecified site of unspecified female breast: Secondary | ICD-10-CM

## 2012-06-23 LAB — CBC WITH DIFFERENTIAL/PLATELET
Basophils Absolute: 0 10*3/uL (ref 0.0–0.1)
Eosinophils Absolute: 0.1 10*3/uL (ref 0.0–0.5)
HGB: 13.9 g/dL (ref 11.6–15.9)
MCV: 85 fL (ref 79.5–101.0)
NEUT#: 2.6 10*3/uL (ref 1.5–6.5)
RDW: 13.6 % (ref 11.2–14.5)
lymph#: 1.4 10*3/uL (ref 0.9–3.3)

## 2012-06-23 LAB — COMPREHENSIVE METABOLIC PANEL (CC13)
Albumin: 3.7 g/dL (ref 3.5–5.0)
BUN: 13 mg/dL (ref 7.0–26.0)
Calcium: 9.4 mg/dL (ref 8.4–10.4)
Chloride: 107 mEq/L (ref 98–107)
Glucose: 69 mg/dl — ABNORMAL LOW (ref 70–99)
Potassium: 4.1 mEq/L (ref 3.5–5.1)

## 2012-06-23 MED ORDER — VENLAFAXINE HCL ER 37.5 MG PO CP24: 37.5000 mg | ORAL_CAPSULE | Freq: Every day | ORAL | Status: DC

## 2012-06-23 NOTE — Progress Notes (Signed)
ID: April Robinson   DOB: 1961/08/09  MR#: [id number]  WUJ#:[id number]  HISTORY OF PRESENT ILLNESS: The patient had a questionable palpable finding in the left breast which was evaluated in July 2009 and it was recommended that she return for further evaluation but actually did not show.  She had bilateral diagnostic mammography in March 2011 which showed minimal duct ectasia of the left breast at the location in question.  Again, 31-month interval mammography was recommended and this was repeated August 2012.  There was minimally a more prominent ductal ectasia and MRI was recommended for further evaluation.  This was performed on June 22, 2011, and it showed a 1.2 cm enhancing mass in the posterior aspect of an area of irregular linear enhancement.  Anteriorly to this, there was a 6 mm area which is the area of dilated duct noted by prior evaluation.   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   The tumor was estrogen receptor positive at 72%, progesterone receptor positive at 5%, with a very low MIB1 at 4%.  HER2 was not amplified with a ratio by CISH of 1.10.   INTERVAL HISTORY: She returns today for followup of her left breast cancer. She continues on tamoxifen at 20 mg daily which she is tolerating well.  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 Although she likes the job, she is working "crazy hours". Basically she works night shift, 7 days on and 7 days off.   REVIEW OF SYSTEMS: She is tolerating the tamoxifen well, her biggest complaint being increased hot flashes. These do interrupt her sleep, and she tells me they can be "pretty intense". She's having 3-4 hot flashes daily. She denies any vaginal bleeding. She's had no vaginal discharge or dryness.  She is up-to-date on her pelvic exam. No peripheral swelling or evidence of abnormal clotting. No change in vision.   Metztli also denies any recent illnesses and has had no fevers or chills. No nausea or change in bowel habits. No cough, shortness of breath, or chest pain. No abnormal headaches or dizziness. No unusual myalgias or arthralgias.  Otherwise a detailed review of systems is noncontributory.   PAST MEDICAL HISTORY: Past Medical History  Diagnosis Date  . Alcoholism   . Arthritis   . Personal history of colonic polyps     TUBULAR ADENOMA  . Neuroendocrine cancer   . Hyperlipemia   . Esophageal reflux   . ADD (attention deficit disorder with hyperactivity)   . Anxiety states   . Depression   . Hiatal hernia   . GERD (gastroesophageal reflux disease)     severe   . IBS (irritable bowel syndrome)   . Breast cancer 2012    L breast, , ER/PR +, Her2 -  . Hx of radiation therapy 08/27/11 to 10/15/11    L breast    PAST SURGICAL HISTORY: Past Surgical History  Procedure Date  . Laparoscopic hysterectomy 1995  . Abdominal hysterectomy   . Breast lumpectomy 07/23/11    left breast  no BSO  FAMILY HISTORY Family History  Problem Relation Age of Onset  . Breast cancer Mother   . Cancer Mother     breast  . Colon cancer Neg Hx   . Diabetes Father   . Hypertension  Father   . Cancer Brother     NEUROENDOCRINE  . Hypertension Brother   The patient's father is alive at age 33.  The patient's mother is alive at age 48.  She was diagnosed with breast cancer at age 13.  The patient had 2 brothers; one of them died from a neuroendocrine tumor.  She has 1 sister.  There is no other breast or ovarian cancer in the immediate family.  GYNECOLOGIC HISTORY: She had menarche, age 81.  She had a hysterectomy in 1993 .  She took hormone replacement for at least 2 years.  She is GX P3 with first live birth at age 17.  SOCIAL HISTORY: The patient worked in Clinical biochemist, is  currently going to school in Lobbyist.  Her husband, Nikala Walsworth, works as a Naval architect.  The patient's children from a prior marriage are:  [name], 34, who lives in [place] and works as a Estate agent.  [name] who works as a Runner, broadcasting/film/video of ages between 5 and 12; she is also a Theatre stage manager.  Colina Freeport, 29, who studies accounting at [employer].   ADVANCED DIRECTIVES:  HEALTH MAINTENANCE: History  Substance Use Topics  . Smoking status: Former Games developer  . Smokeless tobacco: Never Used  . Alcohol Use: No     Colonoscopy:  PAP:  UTD, Dr. Lily Peer  Bone density:  Lipid panel:  Allergies  Allergen Reactions  . Meloxicam Shortness Of Breath and Nausea Only  . Latex   . Miconazole Nitrate Other (See Comments)    SWELLING OF SOFT PALATE THAT CAUSED DIFFICULTY SWALLOWING    Current Outpatient Prescriptions  Medication Sig Dispense Refill  . dexlansoprazole (DEXILANT) 60 MG capsule Take 1 capsule (60 mg total) by mouth daily.  30 capsule  5  . IBUPROFEN IB PO Take by mouth as needed.        . tamoxifen (NOLVADEX) 20 MG tablet Take 20 mg by mouth daily.      . fluconazole (DIFLUCAN) 100 MG tablet 100 mg daily.      . promethazine (PHENERGAN) 25 MG tablet Take 1 tablet (25 mg total) by mouth every 6 (six) hours as needed for nausea.  10 tablet  0  . valACYclovir (VALTREX) 500 MG tablet Take 500 mg by mouth 2 (two) times daily. Take by mouth as needed fare up      . venlafaxine XR (EFFEXOR-XR) 37.5 MG 24 hr capsule Take 1 capsule (37.5 mg total) by mouth daily.  30 capsule  5    OBJECTIVE: Early middle-aged African American woman who appears comfortable and in no acute distress. Filed Vitals:   06/23/12 1036  BP: 116/82  Pulse: 93  Temp: 98.2 F (36.8 C)  Resp: 20     Body mass index is 32.16 kg/(m^2).    ECOG FS: 0 Filed Weights   06/23/12 1036  Weight: 178 lb 11.2 oz (81.058 kg)   Sclerae unicteric Oropharynx  benign No peripheral adenopathy Lungs no rales or rhonchi Heart regular rate and rhythm Abd benign, soft and nontender MSK no focal spinal tenderness, no peripheral edema Neuro: nonfocal Breasts: Right breast is benign. Left breast status post lumpectomy. No suspicious nodularity palpated. No obvious skin changes, nipple inversion, or nipple discharge. No evidence of local recurrence. No axillary lymphadenopathy on either the right or the left.  LAB RESULTS: Lab Results  Component Value Date   WBC 4.4 06/23/2012   NEUTROABS 2.6 06/23/2012  HGB 13.9 06/23/2012   HCT 40.8 06/23/2012   MCV 85.0 06/23/2012   PLT 211 06/23/2012      Chemistry      Component Value Date/Time   NA 140 04/27/2012 2030   K 4.0 04/27/2012 2030   CL 106 04/27/2012 2030   CO2 24 04/27/2012 2030   BUN 11 04/27/2012 2030   CREATININE 0.89 04/27/2012 2030      Component Value Date/Time   CALCIUM 9.6 04/27/2012 2030   ALKPHOS 51 04/27/2012 2030   AST 17 04/27/2012 2030   ALT 16 04/27/2012 2030   BILITOT 0.5 04/27/2012 2030       Lab Results  Component Value Date   LABCA2 27 07/15/2011     STUDIES:  Mm Digital Diagnostic Bilat  06/06/2012  *RADIOLOGY REPORT*  Clinical Data:  The patient underwent left lumpectomy for breast cancer in 2012.  DIGITAL DIAGNOSTIC BILATERAL MAMMOGRAM WITH CAD  Comparison:  05/28/2011  Findings:  There are scattered fibroglandular densities.  Left lumpectomy changes are present.  There is no suspicious dominant mass, nonsurgical architectural distortion or calcification to suggest malignancy. Mammographic images were processed with CAD.  IMPRESSION: No mammographic evidence of malignancy.  RECOMMENDATION: Yearly diagnostic mammography is suggested.  BI-RADS CATEGORY 2:  Benign finding(s).  Original Report Authenticated By: Daryl Eastern, M.D.    ASSESSMENT: A 51 year old [place] woman   (1)  status post left lumpectomy and sentinel lymph node biopsy September of 2012 for a T1c N0, Stage IA   invasive ductal carcinoma, grade 2, estrogen and progesterone receptor positive with a very low MIB-1 and no HER-2 amplification.   (2)  She completed her adjuvant radiation 10/15/2011 and started tamoxifen 10/27/2011.  PLAN:  Mauricia continues to do well as far as her breast cancer is concerned, with no clinical evidence of disease recurrence at this time. She'll continue on tamoxifen.  We're going to try a low dose of Effexor XR, 37.5 mg daily, to see if we can get her hot flashes under control. She will let us know if this is not helpful, or if she has any disagreeable side effects to the Effexor.  The plan is to see her every 6 months until she reaches the 2 year mark at which time we will begin seeing her annually. In January 2015, after she has been on tamoxifen for 2 years, we will check her hormone levels to decide whether or not she is ready to switch to aromatase inhibitor.  Korene will call with any changes or problems.   Shamira Toutant    06/23/2012

## 2012-06-23 NOTE — Telephone Encounter (Signed)
Gave patient appointment for 11-21-2012 lab only 11-2012 md appointment

## 2012-08-03 ENCOUNTER — Ambulatory Visit: Payer: Managed Care, Other (non HMO)

## 2012-08-14 ENCOUNTER — Other Ambulatory Visit: Payer: Self-pay | Admitting: Women's Health

## 2012-10-10 ENCOUNTER — Ambulatory Visit: Payer: Managed Care, Other (non HMO) | Admitting: Family Medicine

## 2012-10-10 DIAGNOSIS — Z0289 Encounter for other administrative examinations: Secondary | ICD-10-CM

## 2012-10-27 ENCOUNTER — Other Ambulatory Visit: Payer: Self-pay | Admitting: Family Medicine

## 2012-10-28 ENCOUNTER — Telehealth: Payer: Self-pay | Admitting: Family Medicine

## 2012-10-28 MED ORDER — PANTOPRAZOLE SODIUM 40 MG PO TBEC: 40.0000 mg | DELAYED_RELEASE_TABLET | Freq: Every day | ORAL | Status: DC

## 2012-10-28 NOTE — Telephone Encounter (Signed)
protonix 40 mg #30  1 po qd ---11 refills

## 2012-10-28 NOTE — Telephone Encounter (Signed)
Patient states with her new insurance, Dexilant is over $100. She is asking if there is an alternative medication that comes in generic form. CB# [phone]

## 2012-10-28 NOTE — Telephone Encounter (Signed)
Detailed message left advising Rx sent to the pharmacy.      KP 

## 2012-11-03 ENCOUNTER — Ambulatory Visit (INDEPENDENT_AMBULATORY_CARE_PROVIDER_SITE_OTHER): Payer: 59 | Admitting: Physician Assistant

## 2012-11-03 VITALS — BP 127/84 | HR 112 | Temp 99.2°F | Resp 18 | Ht 64.0 in | Wt 181.0 lb

## 2012-11-03 DIAGNOSIS — R05 Cough: Secondary | ICD-10-CM

## 2012-11-03 DIAGNOSIS — J101 Influenza due to other identified influenza virus with other respiratory manifestations: Secondary | ICD-10-CM

## 2012-11-03 DIAGNOSIS — J111 Influenza due to unidentified influenza virus with other respiratory manifestations: Secondary | ICD-10-CM

## 2012-11-03 DIAGNOSIS — R509 Fever, unspecified: Secondary | ICD-10-CM

## 2012-11-03 DIAGNOSIS — J029 Acute pharyngitis, unspecified: Secondary | ICD-10-CM

## 2012-11-03 DIAGNOSIS — R059 Cough, unspecified: Secondary | ICD-10-CM

## 2012-11-03 DIAGNOSIS — J329 Chronic sinusitis, unspecified: Secondary | ICD-10-CM

## 2012-11-03 LAB — POCT INFLUENZA A/B
Influenza A, POC: POSITIVE
Influenza B, POC: NEGATIVE

## 2012-11-03 MED ORDER — AMOXICILLIN-POT CLAVULANATE 875-125 MG PO TABS: 1.0000 | ORAL_TABLET | Freq: Two times a day (BID) | ORAL | Status: DC

## 2012-11-03 MED ORDER — OSELTAMIVIR PHOSPHATE 75 MG PO CAPS: 75.0000 mg | ORAL_CAPSULE | Freq: Two times a day (BID) | ORAL | Status: DC

## 2012-11-03 MED ORDER — IPRATROPIUM BROMIDE 0.03 % NA SOLN: 2.0000 | Freq: Two times a day (BID) | NASAL | Status: DC

## 2012-11-03 MED ORDER — HYDROCOD POLST-CHLORPHEN POLST 10-8 MG/5ML PO LQCR: 5.0000 mL | Freq: Two times a day (BID) | ORAL | Status: DC | PRN

## 2012-11-03 MED ORDER — GUAIFENESIN ER 1200 MG PO TB12: 1.0000 | ORAL_TABLET | Freq: Two times a day (BID) | ORAL | Status: DC | PRN

## 2012-11-03 NOTE — Progress Notes (Signed)
Subjective:    Patient ID: April Robinson, female    DOB: 03-Apr-1961, 52 y.o.   MRN: [id number]  HPI This 52 y.o. female presents for evaluation of illness, primarily cough. Symptoms began 2 weeks ago.  Several family members had similar symptoms.  She started to improve, then worsened again 2 days ago.  Nasal congestion, low grade fever, cough (non-productive), scratchy throat, ear pain and pressure, gums are itching.  Neck, shoulders and diaphragm are sore with coughing.  Nausea last night.  No diarrhea.  No dysuria. No flu vaccine this season.   Past Medical History  Diagnosis Date  . Alcoholism   . Arthritis   . Personal history of colonic polyps     TUBULAR ADENOMA  . Neuroendocrine cancer   . Hyperlipemia   . Esophageal reflux   . ADD (attention deficit disorder with hyperactivity)   . Anxiety states   . Depression   . Hiatal hernia   . GERD (gastroesophageal reflux disease)     severe   . IBS (irritable bowel syndrome)   . Breast cancer 2012    L breast, , ER/PR +, Her2 -  . Hx of radiation therapy 08/27/11 to 10/15/11    L breast    Past Surgical History  Procedure Date  . Laparoscopic hysterectomy 1995  . Abdominal hysterectomy   . Breast lumpectomy 07/23/11    left breast    Prior to Admission medications   Medication Sig Start Date End Date Taking? Authorizing Provider  IBUPROFEN IB PO Take by mouth as needed.     Yes Historical Provider, MD  pantoprazole (PROTONIX) 40 MG tablet Take 1 tablet (40 mg total) by mouth daily. 10/28/12  Yes Lelon Perla, DO  tamoxifen (NOLVADEX) 20 MG tablet Take 20 mg by mouth daily.   Yes Historical Provider, MD  valACYclovir (VALTREX) 500 MG tablet TAKE 1 TABLET (500 MG TOTAL) BY MOUTH 2 (TWO) TIMES DAILY. TAKE BY MOUTH AS NEEDED 08/14/12  Yes Harrington Challenger, NP  venlafaxine XR (EFFEXOR-XR) 37.5 MG 24 hr capsule Take 1 capsule (37.5 mg total) by mouth daily. 06/23/12 06/23/13  Amy Allegra Grana, PA    Allergies  Allergen Reactions    . Meloxicam Shortness Of Breath and Nausea Only  . Latex   . Miconazole Nitrate Other (See Comments)    SWELLING OF SOFT PALATE THAT CAUSED DIFFICULTY SWALLOWING    History   Social History  . Marital Status: Married    Spouse Name: Riley Lam    Number of Children: 3  . Years of Education: 12+   Occupational History  . Customer Service   .     Social History Main Topics  . Smoking status: Former Games developer  . Smokeless tobacco: Never Used  . Alcohol Use: No  . Drug Use: No  . Sexually Active: Yes -- Female partner(s)    Birth Control/ Protection: Surgical   Other Topics Concern  . Not on file   Social History Narrative   Lives with her husband. Their children (her 3 and his one) live locally.    Family History  Problem Relation Age of Onset  . Breast cancer Mother   . Cancer Mother     breast  . Colon cancer Neg Hx   . Diabetes Father   . Hypertension Father   . Cancer Brother     NEUROENDOCRINE  . Hypertension Brother   . Arthritis Brother     hips    Review of Systems  As above.    Objective:   Physical Exam  Blood pressure 127/84, pulse 112, temperature 99.2 F (37.3 C), temperature source Oral, resp. rate 18, height 5\' 4"  (1.626 m), weight 181 lb (82.101 kg), SpO2 98.00%. Body mass index is 31.07 kg/(m^2). Well-developed, well nourished BF who is awake, alert and oriented, in moderate distress with coughing. HEENT: [place]/AT, PERRL, EOMI.  Sclera and conjunctiva are clear.  EAC are patent, TMs are normal in appearance. Nasal mucosa is pink and moist. OP is clear. Neck: supple, non-tender, no lymphadenopathy, thyromegaly. Heart: RRR, no murmur Lungs: normal effort, CTA Extremities: no cyanosis, clubbing or edema. Skin: warm and dry without rash. Psychologic: good mood and appropriate affect, normal speech and behavior.  Results for orders placed in visit on 11/03/12  POCT INFLUENZA A/B      Component Value Range   Influenza A, POC Positive     Influenza B,  POC Negative        Assessment & Plan:   1. Sinusitis  ipratropium (ATROVENT) 0.03 % nasal spray, Guaifenesin (MUCINEX MAXIMUM STRENGTH) 1200 MG TB12, amoxicillin-clavulanate (AUGMENTIN) 875-125 MG per tablet  2. Influenza A  oseltamivir (TAMIFLU) 75 MG capsule  3. Fever  POCT Influenza A/B  4. Cough  POCT Influenza A/B, chlorpheniramine-HYDROcodone (TUSSIONEX PENNKINETIC ER) 10-8 MG/5ML LQCR  5. Sore throat  POCT Influenza A/B   Anticipatory guidance.  Supportive care.  RTW 11/09/2012, sooner if symptoms completely resolved.

## 2012-11-03 NOTE — Patient Instructions (Signed)
Influenza Facts  Flu (influenza) is a contagious respiratory illness caused by the influenza viruses. It can cause mild to severe illness. While most healthy people recover from the flu without specific treatment and without complications, older people, young children, and people with certain health conditions are at higher risk for serious complications from the flu, including death.  CAUSES    The flu virus is spread from person to person by respiratory droplets from coughing and sneezing.   A person can also become infected by touching an object or surface with a virus on it and then touching their mouth, eye or nose.   Adults may be able to infect others from 1 day before symptoms occur and up to 7 days after getting sick. So it is possible to give someone the flu even before you know you are sick and continue to infect others while you are sick.  SYMPTOMS    Fever (usually high).   Headache.   Tiredness (can be extreme).   Cough.   Sore throat.   Runny or stuffy nose.   Body aches.   Diarrhea and vomiting may also occur, particularly in children.   These symptoms are referred to as "flu-like symptoms". A lot of different illnesses, including the common cold, can have similar symptoms.  DIAGNOSIS    There are tests that can determine if you have the flu as long you are tested within the first 2 or 3 days of illness.   A doctor's exam and additional tests may be needed to identify if you have a disease that is a complicating the flu.  RISKS AND COMPLICATIONS   Some of the complications caused by the flu include:   Bacterial pneumonia or progressive pneumonia caused by the flu virus.   Loss of body fluids (dehydration).   Worsening of chronic medical conditions, such as heart failure, asthma, or diabetes.   Sinus problems and ear infections.  HOME CARE INSTRUCTIONS    Seek medical care early on.   If you are at high risk from complications of the flu, consult your health-care provider as soon  as you develop flu-like symptoms. Those at high risk for complications include:   People 65 years or older.   People with chronic medical conditions, including diabetes.   Pregnant women.   Young children.   Your caregiver may recommend use of an antiviral medication to help treat the flu.   If you get the flu, get plenty of rest, drink a lot of liquids, and avoid using alcohol and tobacco.   You can take over-the-counter medications to relieve the symptoms of the flu if your caregiver approves. (Never give aspirin to children or teenagers who have flu-like symptoms, particularly fever).  PREVENTION   The single best way to prevent the flu is to get a flu vaccine each fall. Other measures that can help protect against the flu are:   Antiviral Medications   A number of antiviral drugs are approved for use in preventing the flu. These are prescription medications, and a doctor should be consulted before they are used.   Habits for Good Health   Cover your nose and mouth with a tissue when you cough or sneeze, throw the tissue away after you use it.   Wash your hands often with soap and water, especially after you cough or sneeze. If you are not near water, use an alcohol-based hand cleaner.   Avoid people who are sick.   If you get the   flu, stay home from work or school. Avoid contact with other people so that you do not make them sick, too.   Try not to touch your eyes, nose, or mouth as germs ore often spread this way.  IN CHILDREN, EMERGENCY WARNING SIGNS THAT NEED URGENT MEDICAL ATTENTION:   Fast breathing or trouble breathing.   Bluish skin color.   Not drinking enough fluids.   Not waking up or not interacting.   Being so irritable that the child does not want to be held.   Flu-like symptoms improve but then return with fever and worse cough.   Fever with a rash.  IN ADULTS, EMERGENCY WARNING SIGNS THAT NEED URGENT MEDICAL ATTENTION:   Difficulty breathing or shortness of breath.   Pain  or pressure in the chest or abdomen.   Sudden dizziness.   Confusion.   Severe or persistent vomiting.  SEEK IMMEDIATE MEDICAL CARE IF:   You or someone you know is experiencing any of the symptoms above. When you arrive at the emergency center,report that you think you have the flu. You may be asked to wear a mask and/or sit in a secluded area to protect others from getting sick.  MAKE SURE YOU:    Understand these instructions.   Monitor your condition.   Seek medical care if you are getting worse, or not improving.  Document Released: 10/15/2003 Document Revised: 01/04/2012 Document Reviewed: 07/11/2009  Exit[employer] Patient Information 2013 [employer].

## 2012-11-21 ENCOUNTER — Telehealth: Payer: Self-pay | Admitting: Oncology

## 2012-11-21 ENCOUNTER — Other Ambulatory Visit: Payer: Managed Care, Other (non HMO) | Admitting: Lab

## 2012-11-21 NOTE — Telephone Encounter (Signed)
Pt came in today to r/s 1/27 lb and 2/11 f/u. Pt given new appts for 2/10 and 2/24. Dates per pt. Pt has new February schedule.

## 2012-12-05 ENCOUNTER — Other Ambulatory Visit: Payer: Managed Care, Other (non HMO)

## 2012-12-06 ENCOUNTER — Ambulatory Visit: Payer: Managed Care, Other (non HMO) | Admitting: Oncology

## 2012-12-06 ENCOUNTER — Telehealth: Payer: Self-pay | Admitting: *Deleted

## 2012-12-06 NOTE — Telephone Encounter (Signed)
Confirmed lab appt on 2/12 and f/u appt with Dr. Darnelle Catalan on 2/24.  Pt denies further needs.

## 2012-12-07 ENCOUNTER — Other Ambulatory Visit (HOSPITAL_BASED_OUTPATIENT_CLINIC_OR_DEPARTMENT_OTHER): Payer: 59

## 2012-12-07 DIAGNOSIS — C50519 Malignant neoplasm of lower-outer quadrant of unspecified female breast: Secondary | ICD-10-CM

## 2012-12-07 DIAGNOSIS — C50919 Malignant neoplasm of unspecified site of unspecified female breast: Secondary | ICD-10-CM

## 2012-12-07 LAB — COMPREHENSIVE METABOLIC PANEL (CC13)
ALT: 12 U/L (ref 0–55)
AST: 13 U/L (ref 5–34)
Calcium: 9.4 mg/dL (ref 8.4–10.4)
Chloride: 107 mEq/L (ref 98–107)
Creatinine: 1.2 mg/dL — ABNORMAL HIGH (ref 0.6–1.1)
Potassium: 3.7 mEq/L (ref 3.5–5.1)

## 2012-12-07 LAB — CBC WITH DIFFERENTIAL/PLATELET
BASO%: 0.5 % (ref 0.0–2.0)
LYMPH%: 36.5 % (ref 14.0–49.7)
MCHC: 33.1 g/dL (ref 31.5–36.0)
MONO#: 0.4 10*3/uL (ref 0.1–0.9)
RBC: 4.48 10*6/uL (ref 3.70–5.45)
WBC: 5 10*3/uL (ref 3.9–10.3)
lymph#: 1.8 10*3/uL (ref 0.9–3.3)

## 2012-12-08 LAB — CANCER ANTIGEN 27.29: CA 27.29: 32 U/mL (ref 0–39)

## 2012-12-12 ENCOUNTER — Telehealth: Payer: Self-pay | Admitting: Family Medicine

## 2012-12-12 NOTE — Telephone Encounter (Signed)
We would have to se her formulary - she may be able to take nexium or aciphex or switch back to dexilant

## 2012-12-12 NOTE — Telephone Encounter (Signed)
Patient states she switched from dexilant to pantoprazole 1 month ago and the pantoprazole is not working. Patient would like to know what else she can take.

## 2012-12-12 NOTE — Telephone Encounter (Signed)
Please advise      KP 

## 2012-12-12 NOTE — Telephone Encounter (Signed)
I tried calling the patient but there was no answer       KP 

## 2012-12-16 NOTE — Telephone Encounter (Signed)
Called [phone] (Home)-- There was no answer on this line. The cell phone a guy answered and said I had the wrong number.     KP

## 2012-12-19 ENCOUNTER — Ambulatory Visit: Payer: Self-pay | Admitting: Oncology

## 2012-12-20 NOTE — Telephone Encounter (Signed)
3rd attempt to contact the patient without success.   Letter mailed     KP 

## 2012-12-23 ENCOUNTER — Telehealth: Payer: Self-pay | Admitting: Family Medicine

## 2012-12-23 MED ORDER — PANTOPRAZOLE SODIUM 40 MG PO TBEC: DELAYED_RELEASE_TABLET | ORAL | Status: DC

## 2012-12-23 NOTE — Telephone Encounter (Signed)
requesting a 90-day supply pantoprazole sod dr 40 mg tab

## 2012-12-24 ENCOUNTER — Other Ambulatory Visit: Payer: Self-pay | Admitting: Oncology

## 2012-12-24 DIAGNOSIS — C50912 Malignant neoplasm of unspecified site of left female breast: Secondary | ICD-10-CM

## 2012-12-26 ENCOUNTER — Telehealth: Payer: Self-pay | Admitting: *Deleted

## 2012-12-26 NOTE — Telephone Encounter (Signed)
Called patient's mobile number to discuss refill on tamoxifen.  Explained Triage refills prescriptions through next appointment and she doesn't have a f/u appt.  Asked that she call to rescheduled the missed appoinment on 12-19-2012.  One refill authorized awaiting patient to reschedule f/u.

## 2013-01-30 ENCOUNTER — Other Ambulatory Visit: Payer: Self-pay | Admitting: *Deleted

## 2013-01-30 ENCOUNTER — Ambulatory Visit (HOSPITAL_BASED_OUTPATIENT_CLINIC_OR_DEPARTMENT_OTHER): Payer: 59 | Admitting: Oncology

## 2013-01-30 ENCOUNTER — Telehealth: Payer: Self-pay | Admitting: *Deleted

## 2013-01-30 ENCOUNTER — Other Ambulatory Visit (HOSPITAL_BASED_OUTPATIENT_CLINIC_OR_DEPARTMENT_OTHER): Payer: 59 | Admitting: Lab

## 2013-01-30 VITALS — BP 138/87 | HR 81 | Temp 98.0°F | Resp 20 | Ht 63.0 in | Wt 179.1 lb

## 2013-01-30 DIAGNOSIS — C50912 Malignant neoplasm of unspecified site of left female breast: Secondary | ICD-10-CM

## 2013-01-30 DIAGNOSIS — C50519 Malignant neoplasm of lower-outer quadrant of unspecified female breast: Secondary | ICD-10-CM

## 2013-01-30 DIAGNOSIS — Z170 Estrogen receptor positive status [ER+]: Secondary | ICD-10-CM

## 2013-01-30 DIAGNOSIS — F411 Generalized anxiety disorder: Secondary | ICD-10-CM

## 2013-01-30 LAB — CBC WITH DIFFERENTIAL/PLATELET
BASO%: 0.8 % (ref 0.0–2.0)
EOS%: 1 % (ref 0.0–7.0)
HCT: 41 % (ref 34.8–46.6)
LYMPH%: 31.9 % (ref 14.0–49.7)
MCH: 28.9 pg (ref 25.1–34.0)
MCHC: 33.3 g/dL (ref 31.5–36.0)
MCV: 86.9 fL (ref 79.5–101.0)
MONO%: 8.8 % (ref 0.0–14.0)
NEUT%: 57.5 % (ref 38.4–76.8)
lymph#: 1.5 10*3/uL (ref 0.9–3.3)

## 2013-01-30 LAB — COMPREHENSIVE METABOLIC PANEL (CC13)
ALT: 14 U/L (ref 0–55)
AST: 17 U/L (ref 5–34)
Alkaline Phosphatase: 59 U/L (ref 40–150)
Creatinine: 1.1 mg/dL (ref 0.6–1.1)
Total Bilirubin: 0.55 mg/dL (ref 0.20–1.20)

## 2013-01-30 MED ORDER — TAMOXIFEN CITRATE 20 MG PO TABS: 20.0000 mg | ORAL_TABLET | Freq: Every day | ORAL | Status: DC

## 2013-01-30 NOTE — Progress Notes (Signed)
ID: April Robinson   DOB: 08-Jan-1961  MR#: [id number]  WUJ#:[id number]  PCP: Loreen Freud, DO GYN: Reynaldo Minium SU: Almond Lint OTHER MD: Dorothy Puffer  HISTORY OF PRESENT ILLNESS: The patient had a questionable palpable finding in the left breast which was evaluated in July 2009 and it was recommended that she return for further evaluation but actually did not show.  She had bilateral diagnostic mammography in March 2011 which showed minimal duct ectasia of the left breast at the location in question.  Again, 31-month interval mammography was recommended and this was repeated August 2012.  There was minimally a more prominent ductal ectasia and MRI was recommended for further evaluation.  This was performed on June 22, 2011, and it showed a 1.2 cm enhancing mass in the posterior aspect of an area of irregular linear enhancement.  Anteriorly to this, there was a 6 mm area which is the area of dilated duct noted by prior evaluation.   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 The tumor was estrogen receptor positive at 72%, progesterone receptor positive at 5%, with a very low MIB1 at 4%.  HER2 was not amplified with a ratio by [facility] of 1.10. The patient's subsequent history is as detailed below.  INTERVAL HISTORY: She returns today for followup of her left breast cancer. The interval history is significant for her continuing to work incredibly hard, putting in an 84 hour week of night shifts followed by weekly, but then studying her IT courses and taking care of family. On top of all that she volunteers at her church.  REVIEW OF SYSTEMS: She is tolerating the tamoxifen with hot flashes as her main problem. Sometimes she even feels a little dizzy and short of breath during the hot flashes. She says this tends to happen  when she is particularly stressed. She has some heartburn issues, feels anxious sometimes and forgetful. She is having no vaginal wetness or dryness issues. A detailed review of systems today was otherwise noncontributory   PAST MEDICAL HISTORY: Past Medical History  Diagnosis Date  . Alcoholism   . Arthritis   . Personal history of colonic polyps     TUBULAR ADENOMA  . Neuroendocrine cancer   . Hyperlipemia   . Esophageal reflux   . ADD (attention deficit disorder with hyperactivity)   . Anxiety states   . Depression   . Hiatal hernia   . GERD (gastroesophageal reflux disease)     severe   . IBS (irritable bowel syndrome)   . Breast cancer 2012    L breast, , ER/PR +, Her2 -  . Hx of radiation therapy 08/27/11 to 10/15/11    L breast    PAST SURGICAL HISTORY: Past Surgical History  Procedure Laterality Date  . Laparoscopic hysterectomy  1995  . Abdominal hysterectomy    . Breast lumpectomy  07/23/11    left breast  no BSO  FAMILY HISTORY Family History  Problem Relation Age of Onset  . Breast cancer Mother   . Cancer Mother     breast  . Colon cancer Neg Hx   . Diabetes Father   . Hypertension Father   . Cancer Brother     NEUROENDOCRINE  . Hypertension Brother   . Arthritis Brother     hips  The patient's father is alive at  age 25.  The patient's mother is alive at age 66.  She was diagnosed with breast cancer at age 44.  The patient had 2 brothers; one of them died from a neuroendocrine tumor.  She has 1 sister.  There is no other breast or ovarian cancer in the immediate family.  GYNECOLOGIC HISTORY: She had menarche, age 14.  She had a hysterectomy in 1993 .  She took hormone replacement for at least 2 years.  She is GX P3 with first live birth at age 24.  SOCIAL HISTORY: The patient worked in Clinical biochemist, is currently going to school in Lobbyist.  Her husband, April Robinson, works as a Naval architect.  The patient's children from a prior marriage  are:  April Robinson, 34, who lives in [place] and works as a IT trainer.  April Robinson who works as a Runner, broadcasting/film/video of ages between 4 and 12; she is also a Theatre stage manager.  April Robinson, 29, who studies accounting at [employer]. The patient has 8 grandchildren. She is very active in her local church   ADVANCED DIRECTIVES:  HEALTH MAINTENANCE: History  Substance Use Topics  . Smoking status: Former Games developer  . Smokeless tobacco: Never Used  . Alcohol Use: No     Colonoscopy:  PAP:  UTD, Dr. Lily Peer  Bone density:  Lipid panel:  Allergies  Allergen Reactions  . Meloxicam Shortness Of Breath and Nausea Only  . Latex   . Miconazole Nitrate Other (See Comments)    SWELLING OF SOFT PALATE THAT CAUSED DIFFICULTY SWALLOWING    Current Outpatient Prescriptions  Medication Sig Dispense Refill  . amoxicillin-clavulanate (AUGMENTIN) 875-125 MG per tablet Take 1 tablet by mouth 2 (two) times daily.  20 tablet  0  . chlorpheniramine-HYDROcodone (TUSSIONEX PENNKINETIC ER) 10-8 MG/5ML LQCR Take 5 mLs by mouth every 12 (twelve) hours as needed (cough).  140 mL  0  . Guaifenesin (MUCINEX MAXIMUM STRENGTH) 1200 MG TB12 Take 1 tablet (1,200 mg total) by mouth every 12 (twelve) hours as needed.  14 tablet  1  . IBUPROFEN IB PO Take by mouth as needed.        Marland Kitchen ipratropium (ATROVENT) 0.03 % nasal spray Place 2 sprays into the nose 2 (two) times daily.  30 mL  0  . oseltamivir (TAMIFLU) 75 MG capsule Take 1 capsule (75 mg total) by mouth 2 (two) times daily.  10 capsule  0  . pantoprazole (PROTONIX) 40 MG tablet 1 tab by mouth daily---Office visit due now  90 tablet  0  . tamoxifen (NOLVADEX) 20 MG tablet TAKE 1 TABLET EVERY DAY  90 tablet  0  . valACYclovir (VALTREX) 500 MG tablet TAKE 1 TABLET (500 MG TOTAL) BY MOUTH 2 (TWO) TIMES DAILY. TAKE BY MOUTH AS NEEDED  30 tablet  2  . venlafaxine XR (EFFEXOR-XR) 37.5 MG 24 hr capsule Take 1 capsule (37.5 mg total) by  mouth daily.  30 capsule  5   No current facility-administered medications for this visit.    OBJECTIVE: Early middle-aged African American woman in no acute distress. Filed Vitals:   01/30/13 1521  BP: 138/87  Pulse: 81  Temp: 98 F (36.7 C)  Resp: 20     Body mass index is 31.73 kg/(m^2).    ECOG FS: 0 Filed Weights   01/30/13 1521  Weight: 179 lb 1.6 oz (81.239 kg)   Sclerae unicteric Oropharynx benign No peripheral adenopathy Lungs no rales or  rhonchi Heart regular rate and rhythm Abd benign, soft and nontender MSK no focal spinal tenderness, no peripheral edema Neuro: nonfocal, well oriented, wasn't affect Breasts: Right breast is benign. Left breast status post lumpectomy and radiation. There is no evidence of local recurrence. The left axilla is benign.  LAB RESULTS: Lab Results  Component Value Date   WBC 5.0 12/07/2012   NEUTROABS 2.6 12/07/2012   HGB 12.9 12/07/2012   HCT 39.0 12/07/2012   MCV 86.9 12/07/2012   PLT 209 12/07/2012      Chemistry      Component Value Date/Time   NA 144 12/07/2012 0828   NA 140 04/27/2012 2030   K 3.7 12/07/2012 0828   K 4.0 04/27/2012 2030   CL 107 12/07/2012 0828   CL 106 04/27/2012 2030   CO2 29 12/07/2012 0828   CO2 24 04/27/2012 2030   BUN 14.6 12/07/2012 0828   BUN 11 04/27/2012 2030   CREATININE 1.2* 12/07/2012 0828   CREATININE 0.89 04/27/2012 2030      Component Value Date/Time   CALCIUM 9.4 12/07/2012 0828   CALCIUM 9.6 04/27/2012 2030   ALKPHOS 58 12/07/2012 0828   ALKPHOS 51 04/27/2012 2030   AST 13 12/07/2012 0828   AST 17 04/27/2012 2030   ALT 12 12/07/2012 0828   ALT 16 04/27/2012 2030   BILITOT 0.49 12/07/2012 0828   BILITOT 0.5 04/27/2012 2030       Lab Results  Component Value Date   LABCA2 32 12/07/2012     STUDIES:  Mm Digital Diagnostic Bilat  06/06/2012  *RADIOLOGY REPORT*  Clinical Data:  The patient underwent left lumpectomy for breast cancer in 2012.  DIGITAL DIAGNOSTIC BILATERAL MAMMOGRAM WITH CAD  Comparison:   05/28/2011  Findings:  There are scattered fibroglandular densities.  Left lumpectomy changes are present.  There is no suspicious dominant mass, nonsurgical architectural distortion or calcification to suggest malignancy. Mammographic images were processed with CAD.  IMPRESSION: No mammographic evidence of malignancy.  RECOMMENDATION: Yearly diagnostic mammography is suggested.  BI-RADS CATEGORY 2:  Benign finding(s).  Original Report Authenticated By: Daryl Eastern, M.D.    ASSESSMENT: 52 y.o.  [place] woman   (1)  status post left lumpectomy and sentinel lymph node biopsy September of 2012 for a T1c N0, Stage IA  invasive ductal carcinoma, grade 2, estrogen and progesterone receptor positive with a very low MIB-1 and no HER-2 amplification.   (2)  She completed her adjuvant radiation 10/15/2011   (3) started tamoxifen 10/27/2011.  (4) s/p remote TAH-BSO  PLAN:  Antoninette is doing well as far as her tamoxifen is concerned, and the plan is to continue on that medication at least through January of 2015. At that point if she wishes she could switch to an aromatase inhibitor. On the other hand since she is status post hysterectomy, she could continue tamoxifen for total of 10 years.  She knows to call for any problems that may develop before the next visit.  Mizuki Hoel C    01/30/2013

## 2013-01-30 NOTE — Telephone Encounter (Signed)
appts made and printed 

## 2013-04-03 ENCOUNTER — Ambulatory Visit (INDEPENDENT_AMBULATORY_CARE_PROVIDER_SITE_OTHER): Payer: 59 | Admitting: Family Medicine

## 2013-04-03 ENCOUNTER — Encounter: Payer: Self-pay | Admitting: Family Medicine

## 2013-04-03 VITALS — BP 108/72 | HR 69 | Temp 97.8°F | Ht 63.0 in | Wt 178.6 lb

## 2013-04-03 DIAGNOSIS — G4726 Circadian rhythm sleep disorder, shift work type: Secondary | ICD-10-CM

## 2013-04-03 DIAGNOSIS — K219 Gastro-esophageal reflux disease without esophagitis: Secondary | ICD-10-CM

## 2013-04-03 DIAGNOSIS — G4700 Insomnia, unspecified: Secondary | ICD-10-CM

## 2013-04-03 MED ORDER — ZOLPIDEM TARTRATE 5 MG PO TABS: 5.0000 mg | ORAL_TABLET | Freq: Every evening | ORAL | Status: DC | PRN

## 2013-04-03 MED ORDER — DEXLANSOPRAZOLE 60 MG PO CPDR: 60.0000 mg | DELAYED_RELEASE_CAPSULE | Freq: Every day | ORAL | Status: DC

## 2013-04-03 NOTE — Assessment & Plan Note (Signed)
D/c protonix dexilant daily To GI if no relief

## 2013-04-03 NOTE — Patient Instructions (Addendum)

## 2013-04-03 NOTE — Progress Notes (Signed)
  Subjective:    Patient ID: April Robinson, female    DOB: 11/25/1960, 52 y.o.   MRN: [id number]  HPI Pt here f/u depression and insomnia.  She works > 80 hours a week --Mon - MOn night shift and than has a week off---she has a lot of trouble sleeping the first few nights.  Pt is looking for another job because the stress is really too much for her but she has no options right now.  Pt also c/o inc GERD symptoms.  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    Review of Systems As above    Objective:   Physical Exam BP 108/72  Pulse 69  Temp(Src) 97.8 F (36.6 C) (Oral)  Ht 5\' 3"  (1.6 m)  Wt 178 lb 9.6 oz (81.012 kg)  BMI 31.65 kg/m2  SpO2 98% General appearance: alert, cooperative, appears stated age and no distress Neck: no adenopathy, no carotid bruit, no JVD, supple, symmetrical, trachea midline and thyroid not enlarged, symmetric, no tenderness/mass/nodules Lungs: clear to auscultation bilaterally Heart: S1, S2 normal Abdomen: soft, non-tender; bowel sounds normal; no masses,  no organomegaly       Assessment & Plan:

## 2013-04-03 NOTE — Assessment & Plan Note (Signed)
ambien 5 mg 1 po qhs first 2-3 nights she is off F/u prn

## 2013-04-24 ENCOUNTER — Other Ambulatory Visit: Payer: Self-pay | Admitting: Oncology

## 2013-06-22 ENCOUNTER — Encounter: Payer: Self-pay | Admitting: Women's Health

## 2013-06-22 ENCOUNTER — Ambulatory Visit (INDEPENDENT_AMBULATORY_CARE_PROVIDER_SITE_OTHER): Payer: 59 | Admitting: Women's Health

## 2013-06-22 VITALS — BP 122/82 | Ht 63.0 in | Wt 177.0 lb

## 2013-06-22 DIAGNOSIS — N951 Menopausal and female climacteric states: Secondary | ICD-10-CM

## 2013-06-22 DIAGNOSIS — N898 Other specified noninflammatory disorders of vagina: Secondary | ICD-10-CM

## 2013-06-22 DIAGNOSIS — L293 Anogenital pruritus, unspecified: Secondary | ICD-10-CM

## 2013-06-22 DIAGNOSIS — Z01419 Encounter for gynecological examination (general) (routine) without abnormal findings: Secondary | ICD-10-CM

## 2013-06-22 DIAGNOSIS — B009 Herpesviral infection, unspecified: Secondary | ICD-10-CM

## 2013-06-22 DIAGNOSIS — R350 Frequency of micturition: Secondary | ICD-10-CM

## 2013-06-22 LAB — URINALYSIS W MICROSCOPIC + REFLEX CULTURE
Crystals: NONE SEEN
Ketones, ur: NEGATIVE mg/dL
Leukocytes, UA: NEGATIVE
Nitrite: NEGATIVE
Specific Gravity, Urine: >1.03 — ABNORMAL HIGH (ref 1.005–1.030)
Urobilinogen, UA: 0.2 mg/dL (ref 0.0–1.0)

## 2013-06-22 LAB — WET PREP FOR TRICH, YEAST, CLUE: Clue Cells Wet Prep HPF POC: NONE SEEN

## 2013-06-22 MED ORDER — VALACYCLOVIR HCL 500 MG PO TABS: ORAL_TABLET | ORAL | Status: DC

## 2013-06-22 MED ORDER — VENLAFAXINE HCL ER 75 MG PO CP24: 75.0000 mg | ORAL_CAPSULE | Freq: Every day | ORAL | Status: DC

## 2013-06-22 MED ORDER — FLUCONAZOLE 150 MG PO TABS: ORAL_TABLET | ORAL | Status: DC

## 2013-06-22 NOTE — Progress Notes (Signed)
April Robinson 24-Aug-1961 [id number]    History:    The patient presents for annual exam.  LAVH for fibroids 1993. 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 Dr. Darnelle Catalan ,manages. Declined BRCA testing. Mother  breast cancer in her 100s, living. Having numerous hot flushes. Colonoscopy -tubular adenoma 2012. Normal CBC and CMET 01/2013. HSV with rare outbreaks. History of anxiety and depression had been on Zoloft currently on no medication.   Past medical history, past surgical history, family history and social history were all reviewed and documented in the EPIC chart. Working very long hours at work 60-80 hours per week.  3 daughters and 1 stepdaughter. One daughter with a child has recently moved back in. Father hypertension and diabetes. Brother died of a neuroendocrine cancer at age 24.   ROS:  A  ROS was performed and pertinent positives and negatives are included in the history.  Exam:  Filed Vitals:   06/22/13 1618  BP: 122/82    General appearance:  Normal Head/Neck:  Normal, without cervical or supraclavicular adenopathy. Thyroid:  Symmetrical, normal in size, without palpable masses or nodularity. Respiratory  Effort:  Normal  Auscultation:  Clear without wheezing or rhonchi Cardiovascular  Auscultation:  Regular rate, without rubs, murmurs or gallops  Edema/varicosities:  Not grossly evident Abdominal  Soft,nontender, without masses, guarding or rebound.  Liver/spleen:  No organomegaly noted  Hernia:  None appreciated  Skin  Inspection:  Grossly normal  Palpation:  Grossly normal Neurologic/psychiatric  Orientation:  Normal with appropriate conversation.  Mood/affect:  Normal  Genitourinary    Breasts: Examined lying and sitting.     Right: Without masses, retractions, discharge or axillary adenopathy.     Left: Well-healed scar left outer  aspect   Inguinal/mons:  Normal without inguinal adenopathy  External genitalia:  Normal  BUS/Urethra/Skene's glands:  Normal  Bladder:  Normal  Vagina:  Normal  Cervix/uterus:  Absent  Adnexa/parametria:     Rt: Without masses or tenderness.   Lt: Without masses or tenderness.  Anus and perineum: Normal  Digital rectal exam: Normal sphincter tone without palpated masses or tenderness  Assessment/Plan:  52 y.o. MBF G3 P3 for annual exam with complaint of vaginal irritation, urinary frequency, numerous hot flushes and fatigue.  2012 left breast cancer + ER and PR, HER-2 negative on tamoxifen Fatigue Numerous hot flushes Yeast HSV 2-rare outbreaks  Plan: Reviewed vitamin D 2000 daily, vitamin E. twice daily may help with hot flushes. Options reviewed, Effexor 75 start with half tablet daily for one to 2 weeks increased to full tablet. Reviewed may help with hot flushes, also history of anxiety and depression. States is doing okay but  fatigue is biggest problem. Instructed  to call if no relief of hot flushes. SBE's, continue annual mammogram, BRCA testing reviewed, has 3 daughters. Will have done with oncologist who had also encouraged. UA negative, urine culture pending. Valtrex 500 twice daily for 3-5 days as needed, prescription, proper use given and reviewed. Diflucan 150 by mouth today and repeat in 3 days if needed, prescription, proper use given and reviewed. DEXA will schedule here. Reviewed importance of calcium rich diet, exercise, home safety and fall prevention discussed. Reviewed importance of changing work schedule was able.  [facility], 5:03 PM 06/22/2013

## 2013-06-22 NOTE — Patient Instructions (Addendum)
Vit D 2000 and Vit E twice daily  Health Recommendations for Postmenopausal Women Respected and ongoing research has looked at the most common causes of death, disability, and poor quality of life in postmenopausal women. The causes include heart disease, diseases of blood vessels, diabetes, depression, cancer, and bone loss (osteoporosis). Many things can be done to help lower the chances of developing these and other common problems: CARDIOVASCULAR DISEASE Heart Disease: A heart attack is a medical emergency. Know the signs and symptoms of a heart attack. Below are things women can do to reduce their risk for heart disease.   Do not smoke. If you smoke, quit.  Aim for a healthy weight. Being overweight causes many preventable deaths. Eat a healthy and balanced diet and drink an adequate amount of liquids.  Get moving. Make a commitment to be more physically active. Aim for 30 minutes of activity on most, if not all days of the week.  Eat for heart health. Choose a diet that is low in saturated fat and cholesterol and eliminate trans fat. Include whole grains, vegetables, and fruits. Read and understand the labels on food containers before buying.  Know your numbers. Ask your caregiver to check your blood pressure, cholesterol (total, HDL, LDL, triglycerides) and blood glucose. Work with your caregiver on improving your entire clinical picture.  High blood pressure. Limit or stop your table salt intake (try salt substitute and food seasonings). Avoid salty foods and drinks. Read labels on food containers before buying. Eating well and exercising can help control high blood pressure. STROKE  Stroke is a medical emergency. Stroke may be the result of a blood clot in a blood vessel in the brain or by a brain hemorrhage (bleeding). Know the signs and symptoms of a stroke. To lower the risk of developing a stroke:  Avoid fatty foods.  Quit smoking.  Control your diabetes, blood pressure, and  irregular heart rate. THROMBOPHLEBITIS (BLOOD CLOT) OF THE LEG  Becoming overweight and leading a stationary lifestyle may also contribute to developing blood clots. Controlling your diet and exercising will help lower the risk of developing blood clots. CANCER SCREENING  Breast Cancer: Take steps to reduce your risk of breast cancer.  You should practice "breast self-awareness." This means understanding the normal appearance and feel of your breasts and should include breast self-examination. Any changes detected, no matter how small, should be reported to your caregiver.  After age 72, you should have a clinical breast exam (CBE) every year.  Starting at age 69, you should consider having a mammogram (breast X-ray) every year.  If you have a family history of breast cancer, talk to your caregiver about genetic screening.  If you are at high risk for breast cancer, talk to your caregiver about having an MRI and a mammogram every year.  Intestinal or Stomach Cancer: Tests to consider are a rectal exam, fecal occult blood, sigmoidoscopy, and colonoscopy. Women who are high risk may need to be screened at an earlier age and more often.  Cervical Cancer:  Beginning at age 66, you should have a Pap test every 3 years as long as the past 3 Pap tests have been normal.  If you have had past treatment for cervical cancer or a condition that could lead to cancer, you need Pap tests and screening for cancer for at least 20 years after your treatment.  If you had a hysterectomy for a problem that was not cancer or a condition that could lead to  cancer, then you no longer need Pap tests.  If you are between ages 14 and 37, and you have had normal Pap tests going back 10 years, you no longer need Pap tests.  If Pap tests have been discontinued, risk factors (such as a new sexual partner) need to be reassessed to determine if screening should be resumed.  Some medical problems can increase the  chance of getting cervical cancer. In these cases, your caregiver may recommend more frequent screening and Pap tests.  Uterine Cancer: If you have vaginal bleeding after reaching menopause, you should notify your caregiver.  Ovarian cancer: Other than yearly pelvic exams, there are no reliable tests available to screen for ovarian cancer at this time except for yearly pelvic exams.  Lung Cancer: Yearly chest X-rays can detect lung cancer and should be done on high risk women, such as cigarette smokers and women with chronic lung disease (emphysema).  Skin Cancer: A complete body skin exam should be done at your yearly examination. Avoid overexposure to the sun and ultraviolet light lamps. Use a strong sun block cream when in the sun. All of these things are important in lowering the risk of skin cancer. MENOPAUSE Menopause Symptoms: Hormone therapy products are effective for treating symptoms associated with menopause:  Moderate to severe hot flashes.  Night sweats.  Mood swings.  Headaches.  Tiredness.  Loss of sex drive.  Insomnia.  Other symptoms. Hormone replacement carries certain risks, especially in older women. Women who use or are thinking about using estrogen or estrogen with progestin treatments should discuss that with their caregiver. Your caregiver will help you understand the benefits and risks. The ideal dose of hormone replacement therapy is not known. The Food and Drug Administration (FDA) has concluded that hormone therapy should be used only at the lowest doses and for the shortest amount of time to reach treatment goals.  OSTEOPOROSIS Protecting Against Bone Loss and Preventing Fracture: If you use hormone therapy for prevention of bone loss (osteoporosis), the risks for bone loss must outweigh the risk of the therapy. Ask your caregiver about other medications known to be safe and effective for preventing bone loss and fractures. To guard against bone loss or  fractures, the following is recommended:  If you are less than age 50, take 1000 mg of calcium and at least 600 mg of Vitamin D per day.  If you are greater than age 61 but less than age 72, take 1200 mg of calcium and at least 600 mg of Vitamin D per day.  If you are greater than age 21, take 1200 mg of calcium and at least 800 mg of Vitamin D per day. Smoking and excessive alcohol intake increases the risk of osteoporosis. Eat foods rich in calcium and vitamin D and do weight bearing exercises several times a week as your caregiver suggests. DIABETES Diabetes Melitus: If you have Type I or Type 2 diabetes, you should keep your blood sugar under control with diet, exercise and recommended medication. Avoid too many sweets, starchy and fatty foods. Being overweight can make control more difficult. COGNITION AND MEMORY Cognition and Memory: Menopausal hormone therapy is not recommended for the prevention of cognitive disorders such as Alzheimer's disease or memory loss.  DEPRESSION  Depression may occur at any age, but is common in elderly women. The reasons may be because of physical, medical, social (loneliness), or financial problems and needs. If you are experiencing depression because of medical problems and control of symptoms,  talk to your caregiver about this. Physical activity and exercise may help with mood and sleep. Community and volunteer involvement may help your sense of value and worth. If you have depression and you feel that the problem is getting worse or becoming severe, talk to your caregiver about treatment options that are best for you. ACCIDENTS  Accidents are common and can be serious in the elderly woman. Prepare your house to prevent accidents. Eliminate throw rugs, place hand bars in the bath, shower and toilet areas. Avoid wearing high heeled shoes or walking on wet, snowy, and icy areas. Limit or stop driving if you have vision or hearing problems, or you feel you are  unsteady with you movements and reflexes. HEPATITIS C Hepatitis C is a type of viral infection affecting the liver. It is spread mainly through contact with blood from an infected person. It can be treated, but if left untreated, it can lead to severe liver damage over years. Many people who are infected do not know that the virus is in their blood. If you are a "baby-boomer", it is recommended that you have one screening test for Hepatitis C. IMMUNIZATIONS  Several immunizations are important to consider having during your senior years, including:   Tetanus, diptheria, and pertussis booster shot.  Influenza every year before the flu season begins.  Pneumonia vaccine.  Shingles vaccine.  Others as indicated based on your specific needs. Talk to your caregiver about these. Document Released: 12/04/2005 Document Revised: 09/28/2012 Document Reviewed: 07/30/2008 [facility] Patient Information 2014 [place], [place]. t

## 2013-06-24 LAB — URINE CULTURE: Colony Count: 50000

## 2013-07-20 ENCOUNTER — Telehealth: Payer: Self-pay | Admitting: *Deleted

## 2013-07-20 MED ORDER — VENLAFAXINE HCL 37.5 MG PO TABS: 37.5000 mg | ORAL_TABLET | Freq: Two times a day (BID) | ORAL | Status: DC

## 2013-07-20 NOTE — Telephone Encounter (Signed)
Pt would like lower dose rx sent, pt will follow up if needed, pt said yes this is helping with hot flashes

## 2013-07-20 NOTE — Telephone Encounter (Signed)
Could take in am and/or try the 37.5 dose.  Is it helping with hot flushes?

## 2013-07-20 NOTE — Telephone Encounter (Signed)
Ok please call in 37.5 dose with refills

## 2013-07-20 NOTE — Telephone Encounter (Signed)
Pt is currently on Effexor 75 mg, pt said she is having some side effects from medication issues with trouble sleeping. She would like decrease in rx if possible? Please advise

## 2013-07-31 ENCOUNTER — Other Ambulatory Visit: Payer: Self-pay | Admitting: Physician Assistant

## 2013-07-31 DIAGNOSIS — C50519 Malignant neoplasm of lower-outer quadrant of unspecified female breast: Secondary | ICD-10-CM

## 2013-08-01 ENCOUNTER — Other Ambulatory Visit: Payer: 59 | Admitting: Lab

## 2013-08-01 ENCOUNTER — Ambulatory Visit: Payer: 59 | Admitting: Physician Assistant

## 2013-08-01 ENCOUNTER — Encounter: Payer: Self-pay | Admitting: Physician Assistant

## 2013-08-01 NOTE — Progress Notes (Signed)
[facility] today.  Letter mailed to patient.  Zollie Scale, PA-C 08/01/2013

## 2013-08-09 ENCOUNTER — Telehealth: Payer: Self-pay | Admitting: *Deleted

## 2013-08-09 NOTE — Telephone Encounter (Signed)
Pt called to rs her [facility]ka on 10/7. gv appt for 08/16/13 w/labs@ 1:45pm and ov@ 2:15pm. Pt is aware...td

## 2013-08-16 ENCOUNTER — Other Ambulatory Visit (HOSPITAL_BASED_OUTPATIENT_CLINIC_OR_DEPARTMENT_OTHER): Payer: 59 | Admitting: Lab

## 2013-08-16 ENCOUNTER — Ambulatory Visit (HOSPITAL_BASED_OUTPATIENT_CLINIC_OR_DEPARTMENT_OTHER): Payer: 59 | Admitting: Physician Assistant

## 2013-08-16 ENCOUNTER — Encounter: Payer: Self-pay | Admitting: Physician Assistant

## 2013-08-16 ENCOUNTER — Telehealth: Payer: Self-pay | Admitting: Oncology

## 2013-08-16 VITALS — BP 129/89 | HR 87 | Temp 97.8°F | Resp 18 | Ht 63.0 in | Wt 179.3 lb

## 2013-08-16 DIAGNOSIS — C50512 Malignant neoplasm of lower-outer quadrant of left female breast: Secondary | ICD-10-CM

## 2013-08-16 DIAGNOSIS — F411 Generalized anxiety disorder: Secondary | ICD-10-CM

## 2013-08-16 DIAGNOSIS — Z90710 Acquired absence of both cervix and uterus: Secondary | ICD-10-CM

## 2013-08-16 DIAGNOSIS — R232 Flushing: Secondary | ICD-10-CM | POA: Insufficient documentation

## 2013-08-16 DIAGNOSIS — T451X5A Adverse effect of antineoplastic and immunosuppressive drugs, initial encounter: Secondary | ICD-10-CM | POA: Insufficient documentation

## 2013-08-16 DIAGNOSIS — F419 Anxiety disorder, unspecified: Secondary | ICD-10-CM

## 2013-08-16 DIAGNOSIS — C50519 Malignant neoplasm of lower-outer quadrant of unspecified female breast: Secondary | ICD-10-CM

## 2013-08-16 DIAGNOSIS — Z170 Estrogen receptor positive status [ER+]: Secondary | ICD-10-CM

## 2013-08-16 DIAGNOSIS — Z780 Asymptomatic menopausal state: Secondary | ICD-10-CM

## 2013-08-16 DIAGNOSIS — Z853 Personal history of malignant neoplasm of breast: Secondary | ICD-10-CM

## 2013-08-16 LAB — CBC WITH DIFFERENTIAL/PLATELET
Eosinophils Absolute: 0.1 10*3/uL (ref 0.0–0.5)
MONO#: 0.3 10*3/uL (ref 0.1–0.9)
NEUT#: 2.6 10*3/uL (ref 1.5–6.5)
Platelets: 214 10*3/uL (ref 145–400)
RBC: 4.63 10*6/uL (ref 3.70–5.45)
RDW: 14.2 % (ref 11.2–14.5)
WBC: 4.8 10*3/uL (ref 3.9–10.3)

## 2013-08-16 LAB — COMPREHENSIVE METABOLIC PANEL (CC13)
Albumin: 3.6 g/dL (ref 3.5–5.0)
Anion Gap: 7 mEq/L (ref 3–11)
CO2: 27 mEq/L (ref 22–29)
Chloride: 109 mEq/L (ref 98–109)
Glucose: 101 mg/dl (ref 70–140)
Potassium: 4.5 mEq/L (ref 3.5–5.1)
Sodium: 143 mEq/L (ref 136–145)
Total Protein: 7.1 g/dL (ref 6.4–8.3)

## 2013-08-16 MED ORDER — GABAPENTIN 100 MG PO CAPS: 100.0000 mg | ORAL_CAPSULE | Freq: Every day | ORAL | Status: DC

## 2013-08-16 MED ORDER — ALPRAZOLAM 0.25 MG PO TABS: 0.2500 mg | ORAL_TABLET | Freq: Two times a day (BID) | ORAL | Status: DC | PRN

## 2013-08-16 MED ORDER — TAMOXIFEN CITRATE 20 MG PO TABS: 20.0000 mg | ORAL_TABLET | Freq: Every day | ORAL | Status: DC

## 2013-08-16 NOTE — Telephone Encounter (Signed)
, °

## 2013-08-16 NOTE — Progress Notes (Signed)
ID: April Robinson   DOB: 1961/04/08  MR#: [id number]  WGN#:[id number]  PCP: Loreen Freud, DO GYN: Reynaldo Minium, MD SU: Almond Lint, MD OTHER MD: Dorothy Puffer, MD;  Sheryn Bison, MD  CHIEF COMPLAINT:  Left Breast Cancer   HISTORY OF PRESENT ILLNESS: The patient had a questionable palpable finding in the left breast which was evaluated in July 2009 and it was recommended that she return for further evaluation but actually did not show.  She had bilateral diagnostic mammography in March 2011 which showed minimal duct ectasia of the left breast at the location in question.  Again, 58-month interval mammography was recommended and this was repeated August 2012.  There was minimally a more prominent ductal ectasia and MRI was recommended for further evaluation.  This was performed on June 22, 2011, and it showed a 1.2 cm enhancing mass in the posterior aspect of an area of irregular linear enhancement.  Anteriorly to this, there was a 6 mm area which is the area of dilated duct noted by prior evaluation.   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 The tumor was estrogen receptor positive at 72%, progesterone receptor positive at 5%, with a very low MIB1 at 4%.  HER2 was not amplified with a ratio by [facility] of 1.10.   The patient's subsequent history is as detailed below.  INTERVAL HISTORY:  April Robinson returns today for routine six-month followup of her left breast carcinoma. She continues to work full-time at [employer],  and is still working the night shift, 12 hour shifts, 7 days on and 7 days off. 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  Her work schedule is really wearing her down, and she is experiencing a lot of fatigue in  addition to anxiety associated with her stressful job. In fact, April Robinson tells me she had what sounds like a panic attack at work last week. She was under a particularly stressful situation, and began to feel slightly dizzy, with nausea and palpitations. This later resolved. She's also had a couple episodes of chest pain in the past which she attributes to stress and anxiety.   April Robinson continues on tamoxifen, with overall good tolerance. She is having an excessive amount of hot flashes. She's recently evaluated at her GYN office, and was prescribed venlafaxine. She tells me it definitely helped with the hot flashes but put her in "slow motion" and she had to discontinue the medication.    REVIEW OF SYSTEMS:  April Robinson has had no recent fevers or chills. She denies any skin changes or rashes and has had no abnormal bruising, bleeding, or clotting. She's had no peripheral swelling. She's eating and drinking fairly well, and has had no change in bowel habits or urinary habits. She denies any vaginal dryness, discharge, or vaginal bleeding. (She is status post hysterectomy, but still has her ovaries.)  April Robinson denies any increased cough or shortness of breath. She's had some occasional headaches, but denies any change in vision. She denies any new or unusual myalgias, arthralgias, or bony pain. As noted above, she does feel anxious, but not depressed.  A detailed review of systems is otherwise stable and noncontributory.   PAST MEDICAL HISTORY: Past Medical History  Diagnosis Date  . Alcoholism   .  Arthritis   . Personal history of colonic polyps     TUBULAR ADENOMA  . Neuroendocrine cancer   . Hyperlipemia   . Esophageal reflux   . ADD (attention deficit disorder with hyperactivity)   . Anxiety states   . Depression   . Hiatal hernia   . GERD (gastroesophageal reflux disease)     severe   . IBS (irritable bowel syndrome)   . Breast cancer 2012    L breast, , ER/PR +, Her2 -  . Hx of radiation  therapy 08/27/11 to 10/15/11    L breast    PAST SURGICAL HISTORY: Past Surgical History  Procedure Laterality Date  . Laparoscopic hysterectomy  1995  . Abdominal hysterectomy    . Breast lumpectomy  07/23/11    left breast  no BSO, still has both ovaries  FAMILY HISTORY Family History  Problem Relation Age of Onset  . Breast cancer Mother   . Cancer Mother     breast  . Colon cancer Neg Hx   . Diabetes Father   . Hypertension Father   . Cancer Brother     NEUROENDOCRINE  . Hypertension Brother   . Arthritis Brother     hips  The patient's father is alive at age 19.  The patient's mother is alive at age 4.  She was diagnosed with breast cancer at age 37.  The patient had 2 brothers; one of them died from a neuroendocrine tumor.  She has 1 sister.  There is no other breast or ovarian cancer in the immediate family.  GYNECOLOGIC HISTORY:  (Updated 08/16/2013) She had menarche, age 10. She is GX P3 with first live birth at age 32. She had a hysterectomy in 1993, but did not have her ovaries removed.  She took hormone replacement for at least 2 years.    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  Her husband, April Robinson, works as a Naval architect.  The patient's children from a prior marriage are:  April Robinson, 34, who lives in [place] and works as a IT trainer.  April Robinson who works as a Runner, broadcasting/film/video of ages between 4 and 12; she is also a Theatre stage manager.  April Robinson, 29, who studies accounting at [employer]. The patient has 8 grandchildren. She is very active in her local church   ADVANCED DIRECTIVES:  HEALTH MAINTENANCE:  (Updated 08/16/2013) History  Substance Use Topics  . Smoking status: Former Games developer  . Smokeless tobacco: Never Used  . Alcohol Use: No     Colonoscopy: March 2012, Dr. Jarold Motto  PAP:  [facility], Dr.  Lily Peer  Bone density: Never  Lipid panel:  [facility], Dr. Laury Axon   Allergies  Allergen Reactions  . Meloxicam Shortness Of Breath and Nausea Only  . Latex   . Miconazole Nitrate Other (See Comments)    SWELLING OF SOFT PALATE THAT CAUSED DIFFICULTY SWALLOWING    Current Outpatient Prescriptions  Medication Sig Dispense Refill  . dexlansoprazole (DEXILANT) 60 MG capsule Take 1 capsule (60 mg total) by mouth daily.  30 capsule  11  . IBUPROFEN IB PO Take by mouth as needed.        . tamoxifen (NOLVADEX) 20 MG tablet Take 1 tablet (20 mg total) by mouth daily.  90 tablet  3  . valACYclovir (VALTREX) 500 MG tablet Take twice daily for 3-5 days  as needed  30 tablet  2  . zolpidem (AMBIEN) 5 MG tablet Take 1 tablet (5 mg total) by mouth at bedtime as needed for sleep.  30 tablet  0  . ALPRAZolam (XANAX) 0.25 MG tablet Take 1 tablet (0.25 mg total) by mouth 2 (two) times daily as needed for anxiety.  15 tablet  0  . fluconazole (DIFLUCAN) 150 MG tablet Take one today repeat in 3 days  2 tablet  1  . gabapentin (NEURONTIN) 100 MG capsule Take 1-2 capsules (100-200 mg total) by mouth at bedtime.  60 capsule  6  . venlafaxine (EFFEXOR) 37.5 MG tablet Take 1 tablet (37.5 mg total) by mouth 2 (two) times daily.  30 tablet  5   No current facility-administered medications for this visit.    OBJECTIVE: Middle-aged [place] American woman  who appears tired but is in no acute distress. Filed Vitals:   08/16/13 1333  BP: 129/89  Pulse: 87  Temp: 97.8 F (36.6 C)  Resp: 18     Body mass index is 31.77 kg/(m^2).    ECOG FS: 0 Filed Weights   08/16/13 1333  Weight: 179 lb 4.8 oz (81.33 kg)   Physical Exam: HEENT:  Sclerae anicteric.  Oropharynx clear. Buccal mucosa is pink. NODES:  No cervical or supraclavicular lymphadenopathy palpated.  BREAST EXAM:  Right breast is unremarkable. Left breast is status post lumpectomy with no evidence of local recurrence. Axillae are benign bilaterally palpable  lymphadenopathy. LUNGS:  Clear to auscultation bilaterally.  No wheezes or rhonchi HEART:  Regular rate and rhythm. No murmur appreciated. ABDOMEN:  Soft, nontender.  Positive bowel sounds.  MSK:  No focal spinal tenderness to palpation. Full range of motion in the upper extremities.  EXTREMITIES:  No peripheral edema.   NEURO:  Nonfocal. Well oriented.  Positive affect.    LAB RESULTS: Lab Results  Component Value Date   WBC 4.8 08/16/2013   NEUTROABS 2.6 08/16/2013   HGB 13.3 08/16/2013   HCT 40.0 08/16/2013   MCV 86.3 08/16/2013   PLT 214 08/16/2013      Chemistry      Component Value Date/Time   NA 143 08/16/2013 1317   NA 140 04/27/2012 2030   K 4.5 08/16/2013 1317   K 4.0 04/27/2012 2030   CL 107 01/30/2013 1509   CL 106 04/27/2012 2030   CO2 27 08/16/2013 1317   CO2 24 04/27/2012 2030   BUN 14.3 08/16/2013 1317   BUN 11 04/27/2012 2030   CREATININE 1.1 08/16/2013 1317   CREATININE 0.89 04/27/2012 2030      Component Value Date/Time   CALCIUM 9.6 08/16/2013 1317   CALCIUM 9.6 04/27/2012 2030   ALKPHOS 61 08/16/2013 1317   ALKPHOS 51 04/27/2012 2030   AST 14 08/16/2013 1317   AST 17 04/27/2012 2030   ALT 14 08/16/2013 1317   ALT 16 04/27/2012 2030   BILITOT 0.38 08/16/2013 1317   BILITOT 0.5 04/27/2012 2030        STUDIES:  Mm Digital Diagnostic Bilat  06/06/2012  *RADIOLOGY REPORT*  Clinical Data:  The patient underwent left lumpectomy for breast cancer in 2012.  DIGITAL DIAGNOSTIC BILATERAL MAMMOGRAM WITH CAD  Comparison:  05/28/2011  Findings:  There are scattered fibroglandular densities.  Left lumpectomy changes are present.  There is no suspicious dominant mass, nonsurgical architectural distortion or calcification to suggest malignancy. Mammographic images were processed with CAD.  IMPRESSION: No mammographic evidence of malignancy.  RECOMMENDATION: Yearly diagnostic mammography  is suggested.  BI-RADS CATEGORY 2:  Benign finding(s).  Original Report Authenticated By:  Daryl Eastern, M.D.     ASSESSMENT: 52 y.o.  April Robinson woman   (1)  status post left lumpectomy and sentinel lymph node biopsy September of 2012 for a T1c N0, Stage IA  invasive ductal carcinoma, grade 2, estrogen and progesterone receptor positive with a very low MIB-1 and no HER-2 amplification.   (2)  She completed her adjuvant radiation 10/15/2011   (3) started tamoxifen 10/27/2011.  (4) s/p remote TAH, with ovaries still intact  (5)  Anxiety  PLAN:  With regards to her breast cancer, April Robinson seems to be doing well with no clinical evidence of disease recurrence at this time. She will continue on tamoxifen which has been refilled for her today.   We will plan on seeing her back in 6 months, at which time she will have been on tamoxifen for total of 2 years. At that point we will discuss the possibility of switching to an aromatase inhibitor, or the possibility of continuing on tamoxifen for total of 10 years. As noted above, she is status post hysterectomy, Robinson certainly would not have to worry about uterine cancer. On the other hand, she still has her ovaries, Robinson we will obtain hormone studies prior to her next appointment to confirm her menopausal status. We will also obtain a baseline bone density along with her annual mammogram which is slightly past due.   April Robinson does not feel like she can take the venlafaxine which I think not only her hot flashes, but also her anxiety. I am prescribing a low dose of gabapentin with instructions to take 100-200 mg at bedtime for hot flashes. I am also prescribing a low dose of, 0.25 mg, with instructions to take 1 tablet up to twice daily as needed for anxiety, dispense #15 with no refills. I am referring her back to her primary care physician, Dr. Laury Axon, in approximately four-week 6 to reevaluate her anxiety and fatigue level. I have also cautioned April Robinson to be evaluated immediately if she has any additional episodes of chest pain, although she  is "sure it is just stress".  She voices her understanding of the above.  April Robinson will return for labs and physical exam in April, but will call prior that time with any changes or problems.    April Briley PA-C    08/16/2013

## 2013-08-28 ENCOUNTER — Telehealth: Payer: Self-pay | Admitting: *Deleted

## 2013-08-28 NOTE — Telephone Encounter (Signed)
i tried to return the pts call but no answer nor vm....td

## 2013-09-15 ENCOUNTER — Ambulatory Visit
Admission: RE | Admit: 2013-09-15 | Discharge: 2013-09-15 | Disposition: A | Discharge status: Home / Self Care | Payer: 59 | Source: Ambulatory Visit | Attending: Physician Assistant | Admitting: Physician Assistant

## 2013-09-15 DIAGNOSIS — Z853 Personal history of malignant neoplasm of breast: Secondary | ICD-10-CM

## 2013-09-15 DIAGNOSIS — Z780 Asymptomatic menopausal state: Secondary | ICD-10-CM

## 2013-09-29 ENCOUNTER — Encounter: Payer: Self-pay | Admitting: Family Medicine

## 2013-09-29 ENCOUNTER — Ambulatory Visit (INDEPENDENT_AMBULATORY_CARE_PROVIDER_SITE_OTHER): Payer: 59 | Admitting: Family Medicine

## 2013-09-29 VITALS — BP 140/93 | HR 80 | Temp 98.0°F | Resp 16 | Wt 183.1 lb

## 2013-09-29 DIAGNOSIS — R079 Chest pain, unspecified: Secondary | ICD-10-CM

## 2013-09-29 DIAGNOSIS — R0781 Pleurodynia: Secondary | ICD-10-CM

## 2013-09-29 DIAGNOSIS — H8110 Benign paroxysmal vertigo, unspecified ear: Secondary | ICD-10-CM

## 2013-09-29 MED ORDER — NAPROXEN 500 MG PO TABS: 500.0000 mg | ORAL_TABLET | Freq: Two times a day (BID) | ORAL | Status: AC

## 2013-09-29 MED ORDER — MECLIZINE HCL 50 MG PO TABS: 50.0000 mg | ORAL_TABLET | Freq: Three times a day (TID) | ORAL | Status: DC | PRN

## 2013-09-29 NOTE — Patient Instructions (Signed)
Follow up as needed Start the Meclizine as needed for dizziness Do the exercises as directed to help stop the dizziness Take the Naproxen twice daily w/ food (similar to ibuprofen) x7-10 days and then as needed Heat or ice- whichever feels better Avoid heavy lifting Call with any questions or concerns Hang in there! Happy Holidays!

## 2013-09-29 NOTE — Progress Notes (Signed)
   Subjective:    Patient ID: April Robinson, female    DOB: 27-Mar-1961, 52 y.o.   MRN: [id number]  HPI Pre visit review using our clinic review tool, if applicable. No additional management support is needed unless otherwise documented below in the visit note.  Dizziness- sxs started 1 month ago.  Not occuring w/ position changes.  Is having vertigo- 'the spins'- and it can occur at any time.  sxs are occuring multiple times each day.  Episodes are short in duration.  No nausea.  L Rib pain- hx of breast cancer, had mammo 09/15/13.  Rib pain is located under L breast- painful to touch, unable to lie on L side or hold granddaughter on that side.  sxs started after the mammo.  No known injury.  Frequent heavy lifting.   Review of Systems For ROS see HPI     Objective:   Physical Exam  Vitals reviewed. Constitutional: She is oriented to person, place, and time. She appears well-developed and well-nourished. No distress.  HENT:  Head: Normocephalic and atraumatic.  Mouth/Throat: Uvula is midline and mucous membranes are normal.  TMs WNL No TTP over sinuses Minimal nasal congestion  Eyes: Conjunctivae and EOM are normal. Pupils are equal, round, and reactive to light.  2-3 beats of horizontal nystagmus  Neck: Normal range of motion. Neck supple.  Cardiovascular: Normal rate, regular rhythm, normal heart sounds and intact distal pulses.   Pulmonary/Chest: Effort normal and breath sounds normal. No respiratory distress. She has no wheezes. She has no rales. She exhibits tenderness (over L anterior false ribs, no breast TTP).  Abdominal: Soft. She exhibits no distension. There is no tenderness. There is no rebound and no guarding.  Musculoskeletal: She exhibits no edema.  Lymphadenopathy:    She has no cervical adenopathy.  Neurological: She is alert and oriented to person, place, and time. She has normal reflexes. No cranial nerve deficit.  Skin: Skin is warm and dry.  Psychiatric:  She has a normal mood and affect. Her behavior is normal. Judgment and thought content normal.          Assessment & Plan:

## 2013-10-01 NOTE — Assessment & Plan Note (Signed)
New.  Appears to be musculoskeletal- likely costochondritis.  Start scheduled NSAIDs.  Reviewed supportive care and red flags that should prompt return.  Pt expressed understanding and is in agreement w/ plan.

## 2013-10-01 NOTE — Assessment & Plan Note (Signed)
New.  Reviewed dx w/ pt.  Start meclizine prn.  Handout given for modified Eppley maneuver.  Reviewed supportive care and red flags that should prompt return.  Pt expressed understanding and is in agreement w/ plan.

## 2013-10-12 ENCOUNTER — Encounter: Payer: Self-pay | Admitting: Physician Assistant

## 2013-10-12 ENCOUNTER — Ambulatory Visit (INDEPENDENT_AMBULATORY_CARE_PROVIDER_SITE_OTHER): Payer: 59 | Admitting: Physician Assistant

## 2013-10-12 VITALS — BP 136/98 | HR 93 | Temp 99.0°F | Resp 18 | Ht 63.0 in | Wt 185.0 lb

## 2013-10-12 DIAGNOSIS — J019 Acute sinusitis, unspecified: Secondary | ICD-10-CM

## 2013-10-12 MED ORDER — AZITHROMYCIN 250 MG PO TABS: ORAL_TABLET | ORAL | Status: DC

## 2013-10-12 NOTE — Patient Instructions (Signed)
Increase fluid intake.  Take antibiotic as prescribed.  Saline nasal spray.  Rest.  Continue vertigo medications.  If vertigo persists, you may need to be sent to Physical Therapy for vestibular rehabilitation

## 2013-10-12 NOTE — Assessment & Plan Note (Signed)
Rx azithromycin. Increase fluid intake. Rest. Probiotic. Saline nasal spray. Humidifier in bedroom. Please call or return to clinic if symptoms not improving.

## 2013-10-12 NOTE — Progress Notes (Signed)
Patient ID: April Robinson, female   DOB: 1960/12/15, 52 y.o.   MRN: [id number]  Patient presents to clinic today complaining of 2 weeks of head congestion, sinus pressure, sinus pain, here pressure, chills and fatigue. Patient also endorses intermittent fevers. Denies recent travel or sick contact. Denies history of asthma. Denies ear pain or tooth pain.   Past Medical History  Diagnosis Date  . Alcoholism   . Arthritis   . Personal history of colonic polyps     TUBULAR ADENOMA  . Neuroendocrine cancer   . Hyperlipemia   . Esophageal reflux   . ADD (attention deficit disorder with hyperactivity)   . Anxiety states   . Depression   . Hiatal hernia   . GERD (gastroesophageal reflux disease)     severe   . IBS (irritable bowel syndrome)   . Breast cancer 2012    L breast, , ER/PR +, Her2 -  . Hx of radiation therapy 08/27/11 to 10/15/11    L breast    Current Outpatient Prescriptions on File Prior to Visit  Medication Sig Dispense Refill  . dexlansoprazole (DEXILANT) 60 MG capsule Take 1 capsule (60 mg total) by mouth daily.  30 capsule  11  . IBUPROFEN IB PO Take by mouth as needed.        . meclizine (ANTIVERT) 50 MG tablet Take 1 tablet (50 mg total) by mouth 3 (three) times daily as needed.  30 tablet  0  . naproxen (NAPROSYN) 500 MG tablet Take 1 tablet (500 mg total) by mouth 2 (two) times daily with a meal.  60 tablet  0  . tamoxifen (NOLVADEX) 20 MG tablet Take 1 tablet (20 mg total) by mouth daily.  90 tablet  3  . valACYclovir (VALTREX) 500 MG tablet Take twice daily for 3-5 days as needed  30 tablet  2   No current facility-administered medications on file prior to visit.    Allergies  Allergen Reactions  . Meloxicam Shortness Of Breath and Nausea Only  . Latex   . Miconazole Nitrate Other (See Comments)    SWELLING OF SOFT PALATE THAT CAUSED DIFFICULTY SWALLOWING    Family History  Problem Relation Age of Onset  . Breast cancer Mother   . Cancer Mother      breast  . Colon cancer Neg Hx   . Diabetes Father   . Hypertension Father   . Cancer Brother     NEUROENDOCRINE  . Hypertension Brother   . Arthritis Brother     hips    History   Social History  . Marital Status: Married    Spouse Name: Riley Lam    Number of Children: 3  . Years of Education: 12+   Occupational History  . Customer Service   .     Social History Main Topics  . Smoking status: Former Games developer  . Smokeless tobacco: Never Used  . Alcohol Use: No  . Drug Use: No  . Sexual Activity: Yes    Partners: Male    Birth Control/ Protection: Surgical   Other Topics Concern  . None   Social History Narrative   Lives with her husband. Their children (her 3 and his one) live locally.    Review of Systems - see history of present illness. All other review of systems are negative.   Filed Vitals:   10/12/13 1634  BP: 136/98  Pulse: 93  Temp: 99 F (37.2 C)  Resp: 18   Physical Exam  Vitals reviewed. Constitutional: She is oriented to person, place, and time and well-developed, well-nourished, and in no distress.  HENT:  Head: Normocephalic and atraumatic.  Right Ear: External ear normal.  Left Ear: External ear normal.  Nose: Nose normal.  Mouth/Throat: Oropharynx is clear and moist. No oropharyngeal exudate.  Tympanic membranes within normal limits bilaterally. Tenderness to percussion of bilateral maxillary sinuses.  Eyes: Conjunctivae are normal.  Neck: Neck supple.  Cardiovascular: Normal rate, regular rhythm, normal heart sounds and intact distal pulses.   Pulmonary/Chest: Effort normal and breath sounds normal. No respiratory distress. She has no wheezes. She has no rales. She exhibits no tenderness.  Lymphadenopathy:    She has no cervical adenopathy.  Neurological: She is alert and oriented to person, place, and time.  Skin: Skin is warm and dry. No rash noted.  Psychiatric: Affect normal.     Recent Results (from the past 2160 hour(s))   CBC WITH DIFFERENTIAL     Status: None   Collection Time    08/16/13  1:17 PM      Result Value Range   WBC 4.8  3.9 - 10.3 10e3/uL   NEUT# 2.6  1.5 - 6.5 10e3/uL   HGB 13.3  11.6 - 15.9 g/dL   HCT 16.1  09.6 - 04.5 %   Platelets 214  145 - 400 10e3/uL   MCV 86.3  79.5 - 101.0 fL   MCH 28.8  25.1 - 34.0 pg   MCHC 33.4  31.5 - 36.0 g/dL   RBC 4.09  8.11 - 9.14 10e6/uL   RDW 14.2  11.2 - 14.5 %   lymph# 1.7  0.9 - 3.3 10e3/uL   MONO# 0.3  0.1 - 0.9 10e3/uL   Eosinophils Absolute 0.1  0.0 - 0.5 10e3/uL   Basophils Absolute 0.0  0.0 - 0.1 10e3/uL   NEUT% 55.1  38.4 - 76.8 %   LYMPH% 36.0  14.0 - 49.7 %   MONO% 6.5  0.0 - 14.0 %   EOS% 1.6  0.0 - 7.0 %   BASO% 0.8  0.0 - 2.0 %  COMPREHENSIVE METABOLIC PANEL (CC13)     Status: None   Collection Time    08/16/13  1:17 PM      Result Value Range   Sodium 143  136 - 145 mEq/L   Potassium 4.5  3.5 - 5.1 mEq/L   Chloride 109  98 - 109 mEq/L   CO2 27  22 - 29 mEq/L   Glucose 101  70 - 140 mg/dl   BUN 78.2  7.0 - 95.6 mg/dL   Creatinine 1.1  0.6 - 1.1 mg/dL   Total Bilirubin 2.13  0.20 - 1.20 mg/dL   Alkaline Phosphatase 61  40 - 150 U/L   AST 14  5 - 34 U/L   ALT 14  0 - 55 U/L   Total Protein 7.1  6.4 - 8.3 g/dL   Albumin 3.6  3.5 - 5.0 g/dL   Calcium 9.6  8.4 - 08.6 mg/dL   Anion Gap 7  3 - 11 mEq/L    Assessment/Plan: Acute sinusitis with symptoms > 10 days Rx azithromycin. Increase fluid intake. Rest. Probiotic. Saline nasal spray. Humidifier in bedroom. Please call or return to clinic if symptoms not improving.

## 2013-10-12 NOTE — Progress Notes (Signed)
Pre visit review using our clinic review tool, if applicable. No additional management support is needed unless otherwise documented below in the visit note/SLS  

## 2013-12-14 ENCOUNTER — Telehealth: Payer: Self-pay | Admitting: *Deleted

## 2013-12-14 NOTE — Telephone Encounter (Signed)
i will mail a letter/avs to make the pt aware that [place] will be on cll 02/15/14. Will gv her the appt for 02/19/14@2 :15pm to see AGB...td

## 2014-01-05 ENCOUNTER — Ambulatory Visit (INDEPENDENT_AMBULATORY_CARE_PROVIDER_SITE_OTHER): Payer: 59 | Admitting: Family Medicine

## 2014-01-05 ENCOUNTER — Encounter: Payer: Self-pay | Admitting: Family Medicine

## 2014-01-05 VITALS — BP 140/90 | HR 81 | Temp 97.9°F | Wt 182.0 lb

## 2014-01-05 DIAGNOSIS — R079 Chest pain, unspecified: Secondary | ICD-10-CM

## 2014-01-05 DIAGNOSIS — E669 Obesity, unspecified: Secondary | ICD-10-CM | POA: Insufficient documentation

## 2014-01-05 DIAGNOSIS — I10 Essential (primary) hypertension: Secondary | ICD-10-CM

## 2014-01-05 LAB — TSH: TSH: 1.635 u[IU]/mL (ref 0.350–4.500)

## 2014-01-05 LAB — CBC WITH DIFFERENTIAL/PLATELET
Basophils Absolute: 0 10*3/uL (ref 0.0–0.1)
Basophils Relative: 0 % (ref 0–1)
EOS ABS: 0.1 10*3/uL (ref 0.0–0.7)
Eosinophils Relative: 2 % (ref 0–5)
HEMATOCRIT: 41.3 % (ref 36.0–46.0)
Hemoglobin: 14.5 g/dL (ref 12.0–15.0)
LYMPHS ABS: 2.2 10*3/uL (ref 0.7–4.0)
LYMPHS PCT: 42 % (ref 12–46)
MCH: 29.2 pg (ref 26.0–34.0)
MCHC: 35.1 g/dL (ref 30.0–36.0)
MCV: 83.3 fL (ref 78.0–100.0)
MONOS PCT: 6 % (ref 3–12)
Monocytes Absolute: 0.3 10*3/uL (ref 0.1–1.0)
Neutro Abs: 2.6 10*3/uL (ref 1.7–7.7)
Neutrophils Relative %: 50 % (ref 43–77)
Platelets: 239 10*3/uL (ref 150–400)
RBC: 4.96 MIL/uL (ref 3.87–5.11)
RDW: 14.9 % (ref 11.5–15.5)
WBC: 5.2 10*3/uL (ref 4.0–10.5)

## 2014-01-05 LAB — BASIC METABOLIC PANEL
BUN: 17 mg/dL (ref 6–23)
CO2: 28 meq/L (ref 19–32)
Calcium: 8.7 mg/dL (ref 8.4–10.5)
Chloride: 108 mEq/L (ref 96–112)
Creat: 1 mg/dL (ref 0.50–1.10)
Glucose, Bld: 105 mg/dL — ABNORMAL HIGH (ref 70–99)
POTASSIUM: 3.7 meq/L (ref 3.5–5.3)
Sodium: 142 mEq/L (ref 135–145)

## 2014-01-05 LAB — HEPATIC FUNCTION PANEL
ALT: 14 U/L (ref 0–35)
AST: 15 U/L (ref 0–37)
Albumin: 3.9 g/dL (ref 3.5–5.2)
Alkaline Phosphatase: 57 U/L (ref 39–117)
BILIRUBIN DIRECT: 0.1 mg/dL (ref 0.0–0.3)
BILIRUBIN INDIRECT: 0.2 mg/dL (ref 0.2–1.2)
BILIRUBIN TOTAL: 0.3 mg/dL (ref 0.2–1.2)
Total Protein: 6.5 g/dL (ref 6.0–8.3)

## 2014-01-05 MED ORDER — GI COCKTAIL ~~LOC~~
30.0000 mL | Freq: Once | ORAL | Status: AC
  Administered 2014-01-05: 30 mL via ORAL

## 2014-01-05 MED ORDER — LISINOPRIL 10 MG PO TABS: 10.0000 mg | ORAL_TABLET | Freq: Every day | ORAL | Status: DC

## 2014-01-05 NOTE — Progress Notes (Signed)
Pre visit review using our clinic review tool, if applicable. No additional management support is needed unless otherwise documented below in the visit note. 

## 2014-01-05 NOTE — Patient Instructions (Signed)
Chest Pain (Nonspecific) °It is often hard to give a specific diagnosis for the cause of chest pain. There is always a chance that your pain could be related to something serious, such as a heart attack or a blood clot in the lungs. You need to follow up with your caregiver for further evaluation. °CAUSES  °· Heartburn. °· Pneumonia or bronchitis. °· Anxiety or stress. °· Inflammation around your heart (pericarditis) or lung (pleuritis or pleurisy). °· A blood clot in the lung. °· A collapsed lung (pneumothorax). It can develop suddenly on its own (spontaneous pneumothorax) or from injury (trauma) to the chest. °· Shingles infection (herpes zoster virus). °The chest wall is composed of bones, muscles, and cartilage. Any of these can be the source of the pain. °· The bones can be bruised by injury. °· The muscles or cartilage can be strained by coughing or overwork. °· The cartilage can be affected by inflammation and become sore (costochondritis). °DIAGNOSIS  °Lab tests or other studies, such as X-rays, electrocardiography, stress testing, or cardiac imaging, may be needed to find the cause of your pain.  °TREATMENT  °· Treatment depends on what may be causing your chest pain. Treatment may include: °· Acid blockers for heartburn. °· Anti-inflammatory medicine. °· Pain medicine for inflammatory conditions. °· Antibiotics if an infection is present. °· You may be advised to change lifestyle habits. This includes stopping smoking and avoiding alcohol, caffeine, and chocolate. °· You may be advised to keep your head raised (elevated) when sleeping. This reduces the chance of acid going backward from your stomach into your esophagus. °· Most of the time, nonspecific chest pain will improve within 2 to 3 days with rest and mild pain medicine. °HOME CARE INSTRUCTIONS  °· If antibiotics were prescribed, take your antibiotics as directed. Finish them even if you start to feel better. °· For the next few days, avoid physical  activities that bring on chest pain. Continue physical activities as directed. °· Do not smoke. °· Avoid drinking alcohol. °· Only take over-the-counter or prescription medicine for pain, discomfort, or fever as directed by your caregiver. °· Follow your caregiver's suggestions for further testing if your chest pain does not go away. °· Keep any follow-up appointments you made. If you do not go to an appointment, you could develop lasting (chronic) problems with pain. If there is any problem keeping an appointment, you must call to reschedule. °SEEK MEDICAL CARE IF:  °· You think you are having problems from the medicine you are taking. Read your medicine instructions carefully. °· Your chest pain does not go away, even after treatment. °· You develop a rash with blisters on your chest. °SEEK IMMEDIATE MEDICAL CARE IF:  °· You have increased chest pain or pain that spreads to your arm, neck, jaw, back, or abdomen. °· You develop shortness of breath, an increasing cough, or you are coughing up blood. °· You have severe back or abdominal pain, feel nauseous, or vomit. °· You develop severe weakness, fainting, or chills. °· You have a fever. °THIS IS AN EMERGENCY. Do not wait to see if the pain will go away. Get medical help at once. Call your local emergency services (911 in [place] Do not drive yourself to the hospital. °MAKE SURE YOU:  °· Understand these instructions. °· Will watch your condition. °· Will get help right away if you are not doing well or get worse. °Document Released: 07/22/2005 Document Revised: 01/04/2012 Document Reviewed: 05/17/2008 °ExitCare® Patient Information ©2014 [employer],   LL[employer]. ° °

## 2014-01-05 NOTE — Progress Notes (Signed)
Patient ID: April Robinson, female   DOB: [birth year], 53 y.o.   MRN: [id number]   Subjective:    Patient ID: April Robinson, female    DOB: January 25, 1961, 53 y.o.   MRN: [id number] HPI Pt here c/o headache and chest pain with inc burping.  No sob or diaphoresis.   Past Medical History  Diagnosis Date  . Alcoholism   . Arthritis   . Personal history of colonic polyps     TUBULAR ADENOMA  . Neuroendocrine cancer   . Hyperlipemia   . Esophageal reflux   . ADD (attention deficit disorder with hyperactivity)   . Anxiety states   . Depression   . Hiatal hernia   . GERD (gastroesophageal reflux disease)     severe   . IBS (irritable bowel syndrome)   . Breast cancer 2012    L breast, , ER/PR +, Her2 -  . Hx of radiation therapy 08/27/11 to 10/15/11    L breast   History   Social History  . Marital Status: Married    Spouse Name: Nathaneil Canary    Number of Children: 3  . Years of Education: 12+   Occupational History  . Customer Service   .     Social History Main Topics  . Smoking status: Former Research scientist (life sciences)  . Smokeless tobacco: Never Used  . Alcohol Use: No  . Drug Use: No  . Sexual Activity: Yes    Partners: Male    Birth Control/ Protection: Surgical   Other Topics Concern  . Not on file   Social History Narrative   Lives with her husband. Their children (her 3 and his one) live locally.   Scheduled Meds: Continuous Infusions: PRN Meds:.   Family History  Problem Relation Age of Onset  . Breast cancer Mother   . Cancer Mother     breast  . Colon cancer Neg Hx   . Diabetes Father   . Hypertension Father   . Cancer Brother     NEUROENDOCRINE  . Hypertension Brother   . Arthritis Brother     hips            Objective:    BP 140/90  Pulse 81  Temp(Src) 97.9 F (36.6 C) (Oral)  Wt 182 lb (82.555 kg)  SpO2 99% General appearance: alert, cooperative, appears stated age and no distress Ears: normal TM's and external ear canals both ears Nose:  Nares normal. Septum midline. Mucosa normal. No drainage or sinus tenderness. Throat: lips, mucosa, and tongue normal; teeth and gums normal Neck: no adenopathy, no carotid bruit, no JVD, supple, symmetrical, trachea midline and thyroid not enlarged, symmetric, no tenderness/mass/nodules Lungs: clear to auscultation bilaterally Heart: S1, S2 normal Abdomen: abnormal findings:  mild tenderness in the epigastrium        Assessment & Plan:  1. Chest pain, unspecified ? gerd --improved with gI cocktail  - EKG 12-Lead - POCT urinalysis dipstick - Troponin I - TSH - Hepatic function panel - CBC w/Diff - H. pylori antibody, IgG - Basic Metabolic Panel (BMET) - gi cocktail (Maalox,Lidocaine,Donnatal); Take 30 mLs by mouth once.  2. HTN (hypertension) Start meds--rto 2-3 weeks - lisinopril (PRINIVIL,ZESTRIL) 10 MG tablet; Take 1 tablet (10 mg total) by mouth daily.  Dispense: 30 tablet; Refill: 2 - TSH - Hepatic function panel - CBC w/Diff - H. pylori antibody, IgG - Basic Metabolic Panel (BMET)

## 2014-01-06 LAB — TROPONIN I: Troponin I: <0.01 ng/mL

## 2014-01-08 ENCOUNTER — Telehealth: Payer: Self-pay | Admitting: Family Medicine

## 2014-01-08 LAB — H. PYLORI ANTIBODY, IGG: H Pylori IgG: 0.41 {ISR}

## 2014-01-08 NOTE — Telephone Encounter (Signed)
Relevant patient education assigned to patient using Emmi. ° °

## 2014-01-22 ENCOUNTER — Telehealth: Payer: Self-pay | Admitting: Family Medicine

## 2014-01-22 NOTE — Telephone Encounter (Signed)
01/22/14  Pt states that she feels small bumps in her mouth.  Wants to know if she is having a reaction to medication lisinopril (PRINIVIL,ZESTRIL) 10 MG.  Pt has been taking for around 2 weeks.  Please contact pt to advise.  If appt is needed, pt would like the latest appt available.  Leave message on pt's voice mail if you can not reach.

## 2014-01-23 NOTE — Telephone Encounter (Signed)
Spoke with patient who states she has had bumps on her tongue for 2 weeks. They are not painful but she would like to have them looked at. Appt made for tomorrow with Nicky Pugh at 8:00

## 2014-01-23 NOTE — Telephone Encounter (Signed)
Attempted to call the patient to follow up, no voice mail set up. Will try back later.

## 2014-01-23 NOTE — Telephone Encounter (Signed)
Patient called to return your phone call back.  °

## 2014-01-23 NOTE — Telephone Encounter (Signed)
Called pt at home number no answer and no voice mail set up.

## 2014-01-24 ENCOUNTER — Ambulatory Visit (INDEPENDENT_AMBULATORY_CARE_PROVIDER_SITE_OTHER): Payer: 59 | Admitting: Nurse Practitioner

## 2014-01-24 VITALS — BP 115/77 | HR 76 | Temp 98.3°F | Wt 184.0 lb

## 2014-01-24 DIAGNOSIS — K1370 Unspecified lesions of oral mucosa: Secondary | ICD-10-CM

## 2014-01-24 DIAGNOSIS — R208 Other disturbances of skin sensation: Secondary | ICD-10-CM

## 2014-01-24 NOTE — Progress Notes (Signed)
Pre-visit discussion using our clinic review tool. No additional management support is needed unless otherwise documented below in the visit note.  

## 2014-01-24 NOTE — Patient Instructions (Addendum)
I am not sure what is causing the burning sensation in your mouth. Possibilities are:  It is related to the digestion problems you had last week, vitamin B deficiency, medication related.  Here are some suggestions: Swish with Buttermilk or drink it, 500 mg lysine bid daily for 4-5 days, chamomile oil 10 drops in Tb water-swish & spit. I have ordered B12 level, ask lab tech to include in labs tomorrow. Consider taking Align daily to aid with digestion, it may make reflux better. You may take lisinopril every other day for week-monitor Blood pressure. OK to stop med if pressure are consistently under 140/90. Sit for % minutes before taking pressure, feet flat on floor. Great job with exercise! A 30 minute walk daily is associated with many health benefits including lower blood pressure, weight loss, better bone health & sleep quality, lower rates of depression, heart disease, cancer, and diabetes.

## 2014-01-24 NOTE — Progress Notes (Signed)
   Subjective:    Patient ID: April Robinson, female    DOB: 30-Dec-1960, 53 y.o.   MRN: [id number]  Oral Pain  This is a new (Pt c/o burning sensation in mouth & feeling of timy bumps along oral mucosa of lower lip.) problem. The current episode started in the past 7 days. The problem occurs constantly. The problem has been unchanged. The pain is mild. Pertinent negatives include no difficulty swallowing, facial pain, fever, oral bleeding, sinus pressure or thermal sensitivity. Associated symptoms comments: Pt had 48 hours of nausea & diarrhea last week, symptoms resolved several days ago. Also had increased reflux symptoms w/severe HA 2 weeks ago-was seen in ofc, GI cocktail improved symptoms. Mild HA yesterday.. She has tried nothing for the symptoms.      Review of Systems  Constitutional: Negative for fever, chills, activity change, appetite change and fatigue.  HENT: Negative for congestion, dental problem, ear pain, facial swelling, mouth sores, postnasal drip, sinus pressure, sore throat, tinnitus, trouble swallowing and voice change.   Respiratory: Negative for cough.   Cardiovascular: Negative for chest pain.  Gastrointestinal: Positive for nausea (resolved) and diarrhea (resolved). Negative for vomiting.  Skin: Negative for rash.  Neurological: Positive for headaches (mild HA yesterday).       Objective:   Physical Exam  Vitals reviewed. Constitutional: She is oriented to person, place, and time. She appears well-developed and well-nourished. No distress.  HENT:  Head: Normocephalic and atraumatic.  Right Ear: External ear normal.  Left Ear: External ear normal.  Mouth/Throat: Oropharynx is clear and moist. No oropharyngeal exudate.  Few Petechiae posterior soft palate. TM nml bilat.   Eyes: Conjunctivae are normal. Pupils are equal, round, and reactive to light. Right eye exhibits no discharge. Left eye exhibits no discharge.  Neck: Normal range of motion. Neck  supple. No thyromegaly present.  Cardiovascular: Normal rate, regular rhythm and normal heart sounds.   No murmur heard. Pulmonary/Chest: Effort normal and breath sounds normal. No respiratory distress. She has no wheezes. She has no rales.  Musculoskeletal:  Normal gait  Lymphadenopathy:    She has no cervical adenopathy (shoddy anterior cervical nodes, NT, moveable).  Neurological: She is alert and oriented to person, place, and time.  Skin: Skin is warm and dry.  Psychiatric: She has a normal mood and affect. Her behavior is normal. Thought content normal.          Assessment & Plan:  1. Burning sensation of mouth DD: vit B def, digestive related, medication related, post-viral - Vitamin B12; Future See pt instructions for suggested therapies. F/u PRN

## 2014-01-25 ENCOUNTER — Other Ambulatory Visit (HOSPITAL_BASED_OUTPATIENT_CLINIC_OR_DEPARTMENT_OTHER): Payer: 59

## 2014-01-25 ENCOUNTER — Other Ambulatory Visit: Payer: Self-pay | Admitting: Nurse Practitioner

## 2014-01-25 DIAGNOSIS — Z853 Personal history of malignant neoplasm of breast: Secondary | ICD-10-CM

## 2014-01-25 DIAGNOSIS — Z90710 Acquired absence of both cervix and uterus: Secondary | ICD-10-CM

## 2014-01-25 DIAGNOSIS — C50519 Malignant neoplasm of lower-outer quadrant of unspecified female breast: Secondary | ICD-10-CM

## 2014-01-25 LAB — COMPREHENSIVE METABOLIC PANEL (CC13)
ALBUMIN: 4 g/dL (ref 3.5–5.0)
ALK PHOS: 76 U/L (ref 40–150)
ALT: 18 U/L (ref 0–55)
AST: 24 U/L (ref 5–34)
Anion Gap: 13 mEq/L — ABNORMAL HIGH (ref 3–11)
BUN: 13.1 mg/dL (ref 7.0–26.0)
CO2: 23 mEq/L (ref 22–29)
Calcium: 9.4 mg/dL (ref 8.4–10.4)
Chloride: 108 mEq/L (ref 98–109)
Creatinine: 1 mg/dL (ref 0.6–1.1)
Glucose: 96 mg/dl (ref 70–140)
POTASSIUM: 4 meq/L (ref 3.5–5.1)
Sodium: 144 mEq/L (ref 136–145)
Total Bilirubin: 0.29 mg/dL (ref 0.20–1.20)
Total Protein: 7.6 g/dL (ref 6.4–8.3)

## 2014-01-25 LAB — CBC WITH DIFFERENTIAL/PLATELET
BASO%: 0.5 % (ref 0.0–2.0)
BASOS ABS: 0 10*3/uL (ref 0.0–0.1)
EOS%: 1.7 % (ref 0.0–7.0)
Eosinophils Absolute: 0.1 10*3/uL (ref 0.0–0.5)
HCT: 40.8 % (ref 34.8–46.6)
HGB: 13.6 g/dL (ref 11.6–15.9)
LYMPH%: 34.9 % (ref 14.0–49.7)
MCH: 28.8 pg (ref 25.1–34.0)
MCHC: 33.4 g/dL (ref 31.5–36.0)
MCV: 86.4 fL (ref 79.5–101.0)
MONO#: 0.3 10*3/uL (ref 0.1–0.9)
MONO%: 5.7 % (ref 0.0–14.0)
NEUT#: 3.4 10*3/uL (ref 1.5–6.5)
NEUT%: 57.2 % (ref 38.4–76.8)
PLATELETS: 236 10*3/uL (ref 145–400)
RBC: 4.73 10*6/uL (ref 3.70–5.45)
RDW: 14.5 % (ref 11.2–14.5)
WBC: 6 10*3/uL (ref 3.9–10.3)
lymph#: 2.1 10*3/uL (ref 0.9–3.3)

## 2014-01-25 LAB — VITAMIN B12: Vitamin B-12: 396 pg/mL (ref 211–911)

## 2014-01-25 LAB — FOLLICLE STIMULATING HORMONE: FSH: 43.6 m[IU]/mL

## 2014-01-25 LAB — LUTEINIZING HORMONE: LH: 21.1 m[IU]/mL

## 2014-01-31 LAB — ESTRADIOL, ULTRA SENS: ESTRADIOL, ULTRA SENSITIVE: 18 pg/mL

## 2014-02-15 ENCOUNTER — Ambulatory Visit: Payer: 59 | Admitting: Oncology

## 2014-02-19 ENCOUNTER — Telehealth: Payer: Self-pay | Admitting: Oncology

## 2014-02-19 ENCOUNTER — Encounter: Payer: Self-pay | Admitting: Physician Assistant

## 2014-02-19 ENCOUNTER — Ambulatory Visit (HOSPITAL_BASED_OUTPATIENT_CLINIC_OR_DEPARTMENT_OTHER): Payer: 59 | Admitting: Physician Assistant

## 2014-02-19 VITALS — BP 144/86 | HR 108 | Temp 98.0°F | Resp 20 | Ht 63.0 in | Wt 187.2 lb

## 2014-02-19 DIAGNOSIS — M25519 Pain in unspecified shoulder: Secondary | ICD-10-CM

## 2014-02-19 DIAGNOSIS — Z853 Personal history of malignant neoplasm of breast: Secondary | ICD-10-CM

## 2014-02-19 DIAGNOSIS — T451X5A Adverse effect of antineoplastic and immunosuppressive drugs, initial encounter: Secondary | ICD-10-CM

## 2014-02-19 DIAGNOSIS — K219 Gastro-esophageal reflux disease without esophagitis: Secondary | ICD-10-CM

## 2014-02-19 DIAGNOSIS — Z170 Estrogen receptor positive status [ER+]: Secondary | ICD-10-CM

## 2014-02-19 DIAGNOSIS — R232 Flushing: Secondary | ICD-10-CM

## 2014-02-19 DIAGNOSIS — C50519 Malignant neoplasm of lower-outer quadrant of unspecified female breast: Secondary | ICD-10-CM

## 2014-02-19 DIAGNOSIS — M25512 Pain in left shoulder: Secondary | ICD-10-CM

## 2014-02-19 NOTE — Progress Notes (Signed)
ID: April Robinson   DOB: [birth year]  MR#: [id number]  DGL#:[id number]  PCP: Garnet Koyanagi, DO GYN: Uvaldo Rising, MD SU: Stark Klein, MD OTHER MD: Kyung Rudd, MD;  Verl Blalock, MD  CHIEF COMPLAINT:  Hx of Left Breast Cancer/On tamoxifen   HISTORY OF PRESENT ILLNESS: The patient had a questionable palpable finding in the left breast which was evaluated in July 2009 and it was recommended that she return for further evaluation but actually did not show.  She had bilateral diagnostic mammography in March 2011 which showed minimal duct ectasia of the left breast at the location in question.  Again, 65-month interval mammography was recommended and this was repeated August 2012.  There was minimally a more prominent ductal ectasia and MRI was recommended for further evaluation.  This was performed on June 22, 2011, and it showed a 1.2 cm enhancing mass in the posterior aspect of an area of irregular linear enhancement.  Anteriorly to this, there was a 6 mm area which is the area of dilated duct noted by prior evaluation.   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 The tumor was estrogen receptor positive at 72%, progesterone receptor positive at 5%, with a very low MIB1 at 4%.  HER2 was not amplified with a ratio by C[facility] of 1.10.   The patient's subsequent history is as detailed below.  INTERVAL HISTORY:  April Robinson returns alone today for routine six-month followup of her left breast carcinoma. She continues to work full-time at [employer],  but is excited to tell me that she is now working first shift, Monday through Friday. She is glad to be on for shift, having previously worked the night shift, 12 hours shifts, 7 days on and 7 days off. She feels like this was really affecting her health.  She  is now trying to be more active and overall, she feels much better,both physically and mentally.  In fact, her biggest complaints today are hot flashes associated with tamoxifen, and a recent increase in pain in the left shoulder. 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 Otherwise, she has some general knee pain, but no additional joint pain elsewhere.  With regards to the hot flashes, April Robinson did not tolerate venlafaxine which she tried in the past for hot flashes. She had a prescription for gabapentin, but never filled the medication. Basically she just "uses a lot of fans".  REVIEW OF SYSTEMS:  April Robinson has had no recent illnesses and denies any  fevers or chills. She denies any skin changes, rashes, abnormal bruising, bleeding, or clotting.  her energy level is good. Her appetite is good. She denies any nausea or emesis, and has had no change in bowel or bladder habits. She does have some mild vaginal dryness. She denies any abnormal vaginal bleeding. She denies any cough, phlegm production, shortness of breath, peripheral swelling, chest pain, or palpitations. She's had no abnormal headaches or dizziness, and currently denies any additional myalgias, arthralgias, or bony pain other than that noted above. Since changing her work schedule, she also denies any current anxiety or depression.  A detailed review of systems is otherwise stable and noncontributory.   PAST MEDICAL HISTORY: Past Medical History  Diagnosis Date  . Alcoholism   .  Arthritis   . Personal history of colonic polyps     TUBULAR ADENOMA  . Neuroendocrine cancer   . Hyperlipemia   . Esophageal reflux   . ADD (attention deficit disorder with hyperactivity)   . Anxiety states   . Depression   . Hiatal hernia   . GERD (gastroesophageal reflux disease)     severe   . IBS (irritable bowel syndrome)   . Breast cancer 2012    L  breast, , ER/PR +, Her2 -  . Hx of radiation therapy 08/27/11 to 10/15/11    L breast    PAST SURGICAL HISTORY: Past Surgical History  Procedure Laterality Date  . Laparoscopic hysterectomy  1995  . Abdominal hysterectomy    . Breast lumpectomy  07/23/11    left breast  no BSO, still has both ovaries  FAMILY HISTORY Family History  Problem Relation Age of Onset  . Breast cancer Mother   . Cancer Mother     breast  . Colon cancer Neg Hx   . Diabetes Father   . Hypertension Father   . Cancer Brother     NEUROENDOCRINE  . Hypertension Brother   . Arthritis Brother     hips  The patient's father is alive at age 70.  The patient's mother is alive at age 46.  She was diagnosed with breast cancer at age 79.  The patient had 2 brothers; one of them died from a neuroendocrine tumor.  She has 1 sister.  There is no other breast or ovarian cancer in the immediate family.  GYNECOLOGIC HISTORY:  (Reviewed 02/19/2014) She had menarche, age 24. She is GX P3 with first live birth at age 53. She had a hysterectomy in 1993, but did not have her ovaries removed.  She took hormone replacement for at least 2 years.    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  Her husband, Darrion Macaulay, works as a Naval architect.  The patient's children from a prior marriage are:  [name], 34, who lives in [place] and works as a IT trainer.  [name] who works as a Runner, broadcasting/film/video of ages between 5 and 12; she is also a Theatre stage manager.  Colina Verdon, 29, who studies accounting at [employer]. The patient has 8 grandchildren. She is very active in her local church   ADVANCED DIRECTIVES:  HEALTH MAINTENANCE:  (Updated 04/27/215) History  Substance Use Topics  . Smoking status: Former Games developer  . Smokeless tobacco: Never Used  . Alcohol Use: No     Colonoscopy: March  2012, Dr. Jarold Motto  PAP:  [facility], Dr. Lily Peer  Bone density:  09/18/2013, normal  Lipid panel:  [facility], Dr. Laury Axon   Allergies  Allergen Reactions  . Meloxicam Shortness Of Breath and Nausea Only  . Latex   . Miconazole Nitrate Other (See Comments)    SWELLING OF SOFT PALATE THAT CAUSED DIFFICULTY SWALLOWING    Current Outpatient Prescriptions  Medication Sig Dispense Refill  . dexlansoprazole (DEXILANT) 60 MG capsule Take 1 capsule (60 mg total) by mouth daily.  30 capsule  11  . tamoxifen (NOLVADEX) 20 MG tablet Take 1 tablet (20 mg total) by mouth daily.  90 tablet  3  . IBUPROFEN IB PO Take by mouth as needed.        Marland Kitchen lisinopril (PRINIVIL,ZESTRIL) 10 MG tablet Take 1 tablet (10  mg total) by mouth daily.  30 tablet  2  . meclizine (ANTIVERT) 50 MG tablet Take 1 tablet (50 mg total) by mouth 3 (three) times daily as needed.  30 tablet  0  . naproxen (NAPROSYN) 500 MG tablet Take 1 tablet (500 mg total) by mouth 2 (two) times daily with a meal.  60 tablet  0  . valACYclovir (VALTREX) 500 MG tablet Take twice daily for 3-5 days as needed  30 tablet  2   No current facility-administered medications for this visit.    OBJECTIVE: Middle-aged [place] American woman  who appears well.  Filed Vitals:   02/19/14 1423  BP: 144/86  Pulse: 108  Temp: 98 F (36.7 C)  Resp: 20     Body mass index is 33.17 kg/(m^2).    ECOG FS: 0 Filed Weights   02/19/14 1423  Weight: 187 lb 3.2 oz (84.913 kg)   Physical Exam: HEENT:  Sclerae anicteric.  Oropharynx clear, pink, and moist. Neck supple, trachea midline. No thyromegaly. NODES:  No cervical or supraclavicular lymphadenopathy palpated.  BREAST EXAM:  Right breast is unremarkable. Left breast is status post lumpectomy and radiation therapy. No suspicious nodularity or skin changes, and no evidence of local recurrence. Axillae are benign bilaterally, with no palpable lymphadenopathy. LUNGS:  Clear to auscultation bilaterally.  No wheezes or  rhonchi HEART:  Regular rate and rhythm. No murmur  ABDOMEN:  Soft, nontender.  Positive bowel sounds.  MSK:  No focal spinal tenderness to palpation. Some limitation in range of motion of the left upper extremity secondary to pain in the left shoulder. There is no swelling in the shoulder and no tenderness to palpation. EXTREMITIES:  No peripheral edema.  No lymphedema noted in the left upper extremity. SKIN:  Benign with no visible rashes or skin changes. No excessive ecchymoses. No petechiae. No pallor. NEURO:  Nonfocal. Well oriented.  Appropriate affect.     LAB RESULTS: Lab Results  Component Value Date   WBC 6.0 01/25/2014   NEUTROABS 3.4 01/25/2014   HGB 13.6 01/25/2014   HCT 40.8 01/25/2014   MCV 86.4 01/25/2014   PLT 236 01/25/2014      Chemistry      Component Value Date/Time   NA 144 01/25/2014 1613   NA 142 01/05/2014 1624   K 4.0 01/25/2014 1613   K 3.7 01/05/2014 1624   CL 108 01/05/2014 1624   CL 107 01/30/2013 1509   CO2 23 01/25/2014 1613   CO2 28 01/05/2014 1624   BUN 13.1 01/25/2014 1613   BUN 17 01/05/2014 1624   CREATININE 1.0 01/25/2014 1613   CREATININE 1.00 01/05/2014 1624   CREATININE 0.89 04/27/2012 2030      Component Value Date/Time   CALCIUM 9.4 01/25/2014 1613   CALCIUM 8.7 01/05/2014 1624   ALKPHOS 76 01/25/2014 1613   ALKPHOS 57 01/05/2014 1624   AST 24 01/25/2014 1613   AST 15 01/05/2014 1624   ALT 18 01/25/2014 1613   ALT 14 01/05/2014 1624   BILITOT 0.29 01/25/2014 1613   BILITOT 0.3 01/05/2014 1624        STUDIES:  Most recent bilateral mammogram on 09/15/2013 was unremarkable.  A bone density on 09/18/2013 was normal.   ASSESSMENT: 53 y.o.  [place] woman   (1)  status post left lumpectomy and sentinel lymph node biopsy September of 2012 for a T1c N0, Stage IA  invasive ductal carcinoma, grade 2, estrogen and progesterone receptor positive with a very low  MIB-1 and no HER-2 amplification.   (2)  She completed her adjuvant radiation 10/15/2011   (3) started  tamoxifen 10/27/2011, the plan being to continue for total of 10 years until 2023.  (4) s/p remote TAH, with ovaries still intact   (5)  left shoulder pain -  to be evaluated by orthopedics   PLAN:  Overall, April Robinson is doing extremely well, and there is no clinical evidence of disease recurrence. She is tolerating the tamoxifen well with the exception of hot flashes, and she seems to be dealing with a hot flashes appropriately.   The majority of our 30 minute appointment today was spent discussing the possibility of switching from tamoxifen to an aromatase inhibitor. Recent labs did show April Robinson to be postmenopausal, and her bone density was normal, both of which could support starting an aromatase inhibitor. With much discussion regarding possible side effects associated with these 2 medications, however, April Robinson really feels like she is tolerating the tamoxifen well and does not want to make a change. She is especially concerned about the possibility of increased joint pain, and she also worries about worsening vaginal dryness. She is very comfortable with continuing with tamoxifen for total of 10 years, and this has been refilled for her accordingly.  With regards to the pain in the left shoulder, she would like to see an orthopedist for further evaluation and management of this discomfort. We're placing a referral for her to Dr. Justice Britain at [facility].  Otherwise, April Robinson will return to see [place] in late November after she has had her next annual mammogram which is due approximately 09/17/2014.  If she is still doing well in November, we will likely begin seeing her on an annual basis at that point.  All of the above was reviewed with April Robinson in detail, and she voices this understanding and agreement with our plan today. She will call with any changes prior to her next scheduled appointment.    Jahdai Padovano Milda Smart PA-C    02/19/2014

## 2014-02-19 NOTE — Telephone Encounter (Signed)
per pof to sch mamma, appts, referral to Dr Shann Medal and gave copy of sch to pt

## 2014-02-21 ENCOUNTER — Telehealth: Payer: Self-pay | Admitting: Oncology

## 2014-02-21 NOTE — Telephone Encounter (Signed)
per [facility] appt w/ Dr Onnie Graham was made w/pt for 5/13@2 :71

## 2014-03-27 ENCOUNTER — Telehealth: Payer: Self-pay | Admitting: *Deleted

## 2014-03-27 NOTE — Telephone Encounter (Signed)
Prior authorization for valacyclovir 500 mg faxed to [facility], will wait for response.

## 2014-03-27 NOTE — Telephone Encounter (Signed)
[facility] said medication does not require PA, I called [facility] to inform them of this as well. [facility] may call [phone] for further assistance if needed.

## 2014-04-07 ENCOUNTER — Other Ambulatory Visit: Payer: Self-pay | Admitting: Family Medicine

## 2014-04-12 ENCOUNTER — Other Ambulatory Visit: Payer: Self-pay | Admitting: Family Medicine

## 2014-04-24 ENCOUNTER — Ambulatory Visit (INDEPENDENT_AMBULATORY_CARE_PROVIDER_SITE_OTHER): Payer: 59 | Admitting: Family Medicine

## 2014-04-24 ENCOUNTER — Encounter: Payer: Self-pay | Admitting: Family Medicine

## 2014-04-24 VITALS — BP 128/76 | HR 72 | Temp 98.3°F | Wt 187.0 lb

## 2014-04-24 DIAGNOSIS — R1013 Epigastric pain: Secondary | ICD-10-CM

## 2014-04-24 DIAGNOSIS — G8929 Other chronic pain: Secondary | ICD-10-CM

## 2014-04-24 MED ORDER — GI COCKTAIL ~~LOC~~
30.0000 mL | Freq: Once | ORAL | Status: AC
  Administered 2014-04-24: 30 mL via ORAL

## 2014-04-24 NOTE — Patient Instructions (Signed)
Food Choices for Gastroesophageal Reflux Disease When you have gastroesophageal reflux disease (GERD), the foods you eat and your eating habits are very important. Choosing the right foods can help ease the discomfort of GERD. WHAT GENERAL GUIDELINES DO I NEED TO FOLLOW?  Choose fruits, vegetables, whole grains, low-fat dairy products, and low-fat meat, fish, and poultry.  Limit fats such as oils, salad dressings, butter, nuts, and avocado.  Keep a food diary to identify foods that cause symptoms.  Avoid foods that cause reflux. These may be different for different people.  Eat frequent small meals instead of three large meals each day.  Eat your meals slowly, in a relaxed setting.  Limit fried foods.  Cook foods using methods other than frying.  Avoid drinking alcohol.  Avoid drinking large amounts of liquids with your meals.  Avoid bending over or lying down until 2-3 hours after eating. WHAT FOODS ARE NOT RECOMMENDED? The following are some foods and drinks that may worsen your symptoms: Vegetables Tomatoes. Tomato juice. Tomato and spaghetti sauce. Chili peppers. Onion and garlic. Horseradish. Fruits Oranges, grapefruit, and lemon (fruit and juice). Meats High-fat meats, fish, and poultry. This includes hot dogs, ribs, ham, sausage, salami, and bacon. Dairy Whole milk and chocolate milk. Sour cream. Cream. Butter. Ice cream. Cream cheese.  Beverages Coffee and tea, with or without caffeine. Carbonated beverages or energy drinks. Condiments Hot sauce. Barbecue sauce.  Sweets/Desserts Chocolate and cocoa. Donuts. Peppermint and spearmint. Fats and Oils High-fat foods, including French fries and potato chips. Other Vinegar. Strong spices, such as black pepper, white pepper, red pepper, cayenne, curry powder, cloves, ginger, and chili powder. The items listed above may not be a complete list of foods and beverages to avoid. Contact your dietitian for more  information. Document Released: 10/12/2005 Document Revised: 10/17/2013 Document Reviewed: 08/16/2013 ExitCare Patient Information 2015 [employer]. This information is not intended to replace advice given to you by your health care Twylah Bennetts. Make sure you discuss any questions you have with your health care Oris Staffieri.  

## 2014-04-24 NOTE — Progress Notes (Signed)
Pre visit review using our clinic review tool, if applicable. No additional management support is needed unless otherwise documented below in the visit note. 

## 2014-04-24 NOTE — Progress Notes (Signed)
  Subjective:     April Robinson is a 53 y.o. female who presents for evaluation of abdominal pain. Onset was 6 days ago. Symptoms have been gradually improving. The pain is described as cramping. Pain is located in the epigastric region without radiation.  Aggravating factors: eating plums Thursday night.  Alleviating factors: none. Associated symptoms: belching, diarrhea and nausea. The patient denies anorexia, arthralagias, chills, constipation, dysuria, fever, flatus, frequency, headache, hematochezia, hematuria, melena, myalgias and sweats.  The patient's history has been marked as reviewed and updated as appropriate.  Review of Systems Pertinent items are noted in HPI.     Objective:    BP 128/76  Pulse 72  Temp(Src) 98.3 F (36.8 C) (Oral)  Wt 187 lb (84.823 kg)  SpO2 96% General appearance: alert, cooperative, appears stated age and no distress Neck: no adenopathy, supple, symmetrical, trachea midline and thyroid not enlarged, symmetric, no tenderness/mass/nodules Lungs: clear to auscultation bilaterally Abdomen: abnormal findings:  moderate tenderness in the epigastrium    Assessment:    Abdominal pain, likely secondary to gerd vs pud .    Plan:    The diagnosis was discussed with the patient and evaluation and treatment plans outlined. See orders for lab and imaging studies. Adhere to simple, bland diet. Initiate empiric trial of acid suppression; see orders. Further follow-up plans will be based on outcome of lab/imaging studies; see orders. Follow up as needed.  con't dexilant and add zantac

## 2014-04-25 LAB — HEPATIC FUNCTION PANEL
ALT: 19 U/L (ref 0–35)
AST: 20 U/L (ref 0–37)
Albumin: 3.8 g/dL (ref 3.5–5.2)
Alkaline Phosphatase: 53 U/L (ref 39–117)
BILIRUBIN DIRECT: 0.1 mg/dL (ref 0.0–0.3)
Total Bilirubin: 0.3 mg/dL (ref 0.2–1.2)
Total Protein: 7 g/dL (ref 6.0–8.3)

## 2014-04-25 LAB — BASIC METABOLIC PANEL
BUN: 13 mg/dL (ref 6–23)
CALCIUM: 9 mg/dL (ref 8.4–10.5)
CO2: 28 mEq/L (ref 19–32)
Chloride: 105 mEq/L (ref 96–112)
Creatinine, Ser: 0.9 mg/dL (ref 0.4–1.2)
GFR: 79.66 mL/min (ref >60.00)
Glucose, Bld: 80 mg/dL (ref 70–99)
POTASSIUM: 3.4 meq/L — AB (ref 3.5–5.1)
SODIUM: 141 meq/L (ref 135–145)

## 2014-04-25 LAB — CBC WITH DIFFERENTIAL/PLATELET
BASOS ABS: 0 10*3/uL (ref 0.0–0.1)
Basophils Relative: 0.5 % (ref 0.0–3.0)
Eosinophils Absolute: 0.1 10*3/uL (ref 0.0–0.7)
Eosinophils Relative: 2.2 % (ref 0.0–5.0)
HEMATOCRIT: 39.8 % (ref 36.0–46.0)
Hemoglobin: 13.3 g/dL (ref 12.0–15.0)
LYMPHS PCT: 38.6 % (ref 12.0–46.0)
Lymphs Abs: 1.9 10*3/uL (ref 0.7–4.0)
MCHC: 33.3 g/dL (ref 30.0–36.0)
MCV: 86.3 fl (ref 78.0–100.0)
MONOS PCT: 8.1 % (ref 3.0–12.0)
Monocytes Absolute: 0.4 10*3/uL (ref 0.1–1.0)
Neutro Abs: 2.4 10*3/uL (ref 1.4–7.7)
Neutrophils Relative %: 50.6 % (ref 43.0–77.0)
Platelets: 240 10*3/uL (ref 150.0–400.0)
RBC: 4.62 Mil/uL (ref 3.87–5.11)
RDW: 14.1 % (ref 11.5–15.5)
WBC: 4.8 10*3/uL (ref 4.0–10.5)

## 2014-04-25 LAB — H. PYLORI ANTIBODY, IGG: H Pylori IgG: NEGATIVE

## 2014-08-27 ENCOUNTER — Encounter: Payer: Self-pay | Admitting: Family Medicine

## 2014-09-04 ENCOUNTER — Ambulatory Visit (INDEPENDENT_AMBULATORY_CARE_PROVIDER_SITE_OTHER): Payer: 59 | Admitting: Medical

## 2014-09-04 ENCOUNTER — Encounter: Payer: Self-pay | Admitting: Medical

## 2014-09-04 VITALS — BP 136/94 | HR 87 | Temp 98.5°F | Ht 63.0 in | Wt 185.0 lb

## 2014-09-04 DIAGNOSIS — J029 Acute pharyngitis, unspecified: Secondary | ICD-10-CM

## 2014-09-04 DIAGNOSIS — R52 Pain, unspecified: Secondary | ICD-10-CM

## 2014-09-04 LAB — POCT RAPID STREP A (OFFICE): Rapid Strep A Screen: NEGATIVE

## 2014-09-04 LAB — POCT INFLUENZA A/B
INFLUENZA B, POC: NEGATIVE
Influenza A, POC: NEGATIVE

## 2014-09-04 MED ORDER — CEFDINIR 300 MG PO CAPS: 300.0000 mg | ORAL_CAPSULE | Freq: Two times a day (BID) | ORAL | Status: DC

## 2014-09-04 NOTE — Progress Notes (Signed)
Subjective:    Patient ID: April Robinson, female    DOB: [birth year], 53 y.o.   MRN: [id number]  HPI   Pt in states yesterday after lunch got acute onset of sorethroat with mild  ha.Very faint minimal bodyaches.(told nurse some then me none) With fatigue. Some rt ear ache as well. Pt works Engineer, technical sales. Pt has 14 year old grandchild. Some exposure to possible strep through teenagers. Pain on swallowing own saliva. Occasional cough.  Past Medical History  Diagnosis Date  . Alcoholism   . Arthritis   . Personal history of colonic polyps     TUBULAR ADENOMA  . Neuroendocrine cancer   . Hyperlipemia   . Esophageal reflux   . ADD (attention deficit disorder with hyperactivity)   . Anxiety states   . Depression   . Hiatal hernia   . GERD (gastroesophageal reflux disease)     severe   . IBS (irritable bowel syndrome)   . Breast cancer 2012    L breast, , ER/PR +, Her2 -  . Hx of radiation therapy 08/27/11 to 10/15/11    L breast    History   Social History  . Marital Status: Married    Spouse Name: Nathaneil Canary    Number of Children: 3  . Years of Education: 12+   Occupational History  . Customer Service   .     Social History Main Topics  . Smoking status: Former Research scientist (life sciences)  . Smokeless tobacco: Never Used  . Alcohol Use: No  . Drug Use: No  . Sexual Activity:    Partners: Male    Birth Control/ Protection: Surgical   Other Topics Concern  . Not on file   Social History Narrative   Lives with her husband. Their children (her 3 and his one) live locally.    Past Surgical History  Procedure Laterality Date  . Laparoscopic hysterectomy  1995  . Abdominal hysterectomy    . Breast lumpectomy  07/23/11    left breast    Family History  Problem Relation Age of Onset  . Breast cancer Mother   . Cancer Mother     breast  . Colon cancer Neg Hx   . Diabetes Father   . Hypertension Father   . Cancer Brother     NEUROENDOCRINE  . Hypertension Brother   . Arthritis Brother      hips    Allergies  Allergen Reactions  . Meloxicam Shortness Of Breath and Nausea Only  . Latex   . Miconazole Nitrate Other (See Comments)    SWELLING OF SOFT PALATE THAT CAUSED DIFFICULTY SWALLOWING    Current Outpatient Prescriptions on File Prior to Visit  Medication Sig Dispense Refill  . DEXILANT 60 MG capsule TAKE 1 CAPSULE BY MOUTH DAILY 30 capsule 10  . IBUPROFEN IB PO Take by mouth as needed.      . naproxen (NAPROSYN) 500 MG tablet Take 1 tablet (500 mg total) by mouth 2 (two) times daily with a meal. 60 tablet 0  . tamoxifen (NOLVADEX) 20 MG tablet Take 1 tablet (20 mg total) by mouth daily. 90 tablet 3  . valACYclovir (VALTREX) 500 MG tablet Take twice daily for 3-5 days as needed 30 tablet 2   No current facility-administered medications on file prior to visit.    BP 136/94 mmHg  Pulse 87  Temp(Src) 98.5 F (36.9 C) (Oral)  Ht $R'5\' 3"'rN$  (1.6 m)  Wt 185 lb (83.915 kg)  BMI 32.78 kg/m2  SpO2 98%     Review of Systems  Constitutional: Positive for fatigue. Negative for fever and chills.  HENT: Positive for ear pain and sore throat. Negative for congestion, facial swelling, hearing loss, mouth sores, postnasal drip, rhinorrhea, tinnitus and trouble swallowing.        Mild rt ear pressure. Sore throat on swallowing.  Respiratory: Positive for cough. Negative for chest tightness, shortness of breath and wheezing.        Rare occasional cough.  Cardiovascular: Negative for chest pain and palpitations.  Gastrointestinal: Negative.   Genitourinary: Negative.   Musculoskeletal: Negative.        She told me no myalgia but to LPN some mild/faint.  Neurological: Positive for headaches. Negative for dizziness, tremors, seizures, syncope, facial asymmetry, speech difficulty, weakness, light-headedness and numbness.       Faint minimal headache.  Hematological: Positive for adenopathy. Does not bruise/bleed easily.  Psychiatric/Behavioral: Negative.          Objective:   Physical Exam   General  Mental Status - Alert. General Appearance - Well groomed. Not in acute distress.  Skin Rashes- No Rashes.  HEENT Head- Normal. Ear Auditory Canal - Left- Normal. Right - Normal.Tympanic Membrane- Left- Normal. Right- Dull red rt tm. Eye Sclera/Conjunctiva- Left- Normal. Right- Normal. Nose & Sinuses Nasal Mucosa- Left- Not Boggy or Congested. Right- Not Boggy or Congested. Faint maxillar  sinus pressure. Mouth & Throat Lips: Upper Lip- Normal: no dryness, cracking, pallor, cyanosis, or vesicular eruption. Lower Lip-Normal: no dryness, cracking, pallor, cyanosis or vesicular eruption. Buccal Mucosa- Bilateral- No Aphthous ulcers. Oropharynx- No Discharge or Erythema. Tonsils: Characteristics- Bilateral- Moderate bright  Erythema or Congestion. Size/Enlargement- Bilateral- No enlargement. Discharge- bilateral-None.  Neck Neck- Supple. No Masses.   Chest and Lung Exam Auscultation: Breath Sounds:-Normal. CTA  Cardiovascular Auscultation:Rythm- Regular, Rate and Rhythm.  Murmurs & Other Heart Sounds:Ausculatation of the heart reveal- No Murmurs.  Lymphatic Head & Neck General Head & Neck Lymphatics: Bilateral: Description- Mild tender enlarged submandibular lymph nodes.        Assessment & Plan:

## 2014-09-04 NOTE — Patient Instructions (Signed)
Your strep and flu test was negative. By exam your throat looks suspicious for possible strep(despite negative rapid strep) and your rt ear looks early infected. I am sending cefdinir to your pharmacy.  Follow up in 7 days or as needed.  If your cough worsens please notify me and we could call in rx.

## 2014-09-04 NOTE — Assessment & Plan Note (Signed)
PT strep and flu test was negative. But by exam her throat looks suspicious for possible strep(despite negative rapid strep) and her  rt ear looks early infected. I am prescribing cefdinir.

## 2014-09-04 NOTE — Progress Notes (Signed)
Pre visit review using our clinic review tool, if applicable. No additional management support is needed unless otherwise documented below in the visit note. 

## 2014-09-17 ENCOUNTER — Ambulatory Visit
Admission: RE | Admit: 2014-09-17 | Discharge: 2014-09-17 | Disposition: A | Discharge status: Home / Self Care | Payer: 59 | Source: Ambulatory Visit | Attending: Physician Assistant | Admitting: Physician Assistant

## 2014-09-17 DIAGNOSIS — Z853 Personal history of malignant neoplasm of breast: Secondary | ICD-10-CM

## 2014-09-27 ENCOUNTER — Other Ambulatory Visit: Payer: Self-pay | Admitting: *Deleted

## 2014-09-27 DIAGNOSIS — C50519 Malignant neoplasm of lower-outer quadrant of unspecified female breast: Secondary | ICD-10-CM

## 2014-09-28 ENCOUNTER — Other Ambulatory Visit: Payer: 59

## 2014-09-28 ENCOUNTER — Ambulatory Visit (HOSPITAL_BASED_OUTPATIENT_CLINIC_OR_DEPARTMENT_OTHER): Payer: 59

## 2014-09-28 DIAGNOSIS — C50512 Malignant neoplasm of lower-outer quadrant of left female breast: Secondary | ICD-10-CM

## 2014-09-28 DIAGNOSIS — C50519 Malignant neoplasm of lower-outer quadrant of unspecified female breast: Secondary | ICD-10-CM

## 2014-09-28 LAB — CBC WITH DIFFERENTIAL/PLATELET
BASO%: 0.2 % (ref 0.0–2.0)
BASOS ABS: 0 10*3/uL (ref 0.0–0.1)
EOS%: 0.9 % (ref 0.0–7.0)
Eosinophils Absolute: 0.1 10*3/uL (ref 0.0–0.5)
HCT: 40.7 % (ref 34.8–46.6)
HEMOGLOBIN: 13.6 g/dL (ref 11.6–15.9)
LYMPH#: 2.1 10*3/uL (ref 0.9–3.3)
LYMPH%: 40.6 % (ref 14.0–49.7)
MCH: 28.5 pg (ref 25.1–34.0)
MCHC: 33.4 g/dL (ref 31.5–36.0)
MCV: 85.1 fL (ref 79.5–101.0)
MONO#: 0.3 10*3/uL (ref 0.1–0.9)
MONO%: 6.5 % (ref 0.0–14.0)
NEUT#: 2.7 10*3/uL (ref 1.5–6.5)
NEUT%: 51.8 % (ref 38.4–76.8)
PLATELETS: 217 10*3/uL (ref 145–400)
RBC: 4.78 10*6/uL (ref 3.70–5.45)
RDW: 14 % (ref 11.2–14.5)
WBC: 5.3 10*3/uL (ref 3.9–10.3)
nRBC: 0 % (ref 0–0)

## 2014-09-28 LAB — COMPREHENSIVE METABOLIC PANEL (CC13)
ALBUMIN: 3.7 g/dL (ref 3.5–5.0)
ALT: 19 U/L (ref 0–55)
ANION GAP: 8 meq/L (ref 3–11)
AST: 17 U/L (ref 5–34)
Alkaline Phosphatase: 68 U/L (ref 40–150)
BUN: 15.1 mg/dL (ref 7.0–26.0)
CALCIUM: 9.4 mg/dL (ref 8.4–10.4)
CHLORIDE: 108 meq/L (ref 98–109)
CO2: 27 mEq/L (ref 22–29)
CREATININE: 1 mg/dL (ref 0.6–1.1)
EGFR: 76 mL/min/{1.73_m2} — AB (ref >90)
GLUCOSE: 81 mg/dL (ref 70–140)
POTASSIUM: 4 meq/L (ref 3.5–5.1)
Sodium: 142 mEq/L (ref 136–145)
Total Bilirubin: 0.32 mg/dL (ref 0.20–1.20)
Total Protein: 7 g/dL (ref 6.4–8.3)

## 2014-10-03 ENCOUNTER — Other Ambulatory Visit: Payer: Self-pay | Admitting: *Deleted

## 2014-10-03 DIAGNOSIS — C50512 Malignant neoplasm of lower-outer quadrant of left female breast: Secondary | ICD-10-CM

## 2014-10-03 MED ORDER — TAMOXIFEN CITRATE 20 MG PO TABS
20.0000 mg | ORAL_TABLET | Freq: Every day | ORAL | Status: DC
Start: 2014-10-03 — End: 2015-11-13

## 2014-10-05 ENCOUNTER — Ambulatory Visit: Payer: 59 | Admitting: Nurse Practitioner

## 2014-10-09 ENCOUNTER — Telehealth: Payer: Self-pay | Admitting: Nurse Practitioner

## 2014-10-09 NOTE — Telephone Encounter (Signed)
pt cld to r/s appt missed-gave pt new time & date-pt understood

## 2014-10-17 ENCOUNTER — Ambulatory Visit (HOSPITAL_BASED_OUTPATIENT_CLINIC_OR_DEPARTMENT_OTHER): Payer: 59 | Admitting: Nurse Practitioner

## 2014-10-17 ENCOUNTER — Encounter: Payer: Self-pay | Admitting: Nurse Practitioner

## 2014-10-17 ENCOUNTER — Telehealth: Payer: Self-pay | Admitting: Nurse Practitioner

## 2014-10-17 VITALS — BP 134/92 | HR 76 | Temp 98.3°F | Resp 20 | Ht 63.0 in | Wt 188.6 lb

## 2014-10-17 DIAGNOSIS — Z23 Encounter for immunization: Secondary | ICD-10-CM

## 2014-10-17 DIAGNOSIS — T451X5A Adverse effect of antineoplastic and immunosuppressive drugs, initial encounter: Secondary | ICD-10-CM

## 2014-10-17 DIAGNOSIS — R232 Flushing: Secondary | ICD-10-CM

## 2014-10-17 DIAGNOSIS — I10 Essential (primary) hypertension: Secondary | ICD-10-CM

## 2014-10-17 DIAGNOSIS — C50512 Malignant neoplasm of lower-outer quadrant of left female breast: Secondary | ICD-10-CM

## 2014-10-17 MED ORDER — INFLUENZA VAC SPLIT QUAD 0.5 ML IM SUSY
0.5000 mL | PREFILLED_SYRINGE | Freq: Once | INTRAMUSCULAR | Status: AC
Start: 2014-10-17 — End: 2014-10-17
  Administered 2014-10-17: 0.5 mL via INTRAMUSCULAR
  Filled 2014-10-17: qty 0.5

## 2014-10-17 MED ORDER — GABAPENTIN 300 MG PO CAPS: 300.0000 mg | ORAL_CAPSULE | Freq: Every day | ORAL | Status: DC

## 2014-10-17 NOTE — Progress Notes (Signed)
ID: April Robinson   DOB: 11-06-1960  MR#: [id number]  FUX#:[id number]  PCP: April Koyanagi, DO GYN: April Rising, MD SU: April Klein, MD OTHER MD: April Rudd, MD;  April Blalock, MD  CHIEF COMPLAINT:  Hx of Left Breast Cancer CURRENT TREATMENT: tamoxifen daily  BREAST CANCER HISTORY: The patient had a questionable palpable finding in the left breast which was evaluated in July 2009 and it was recommended that she return for further evaluation but actually did not show.  She had bilateral diagnostic mammography in March 2011 which showed minimal duct ectasia of the left breast at the location in question.  Again, 26-monthinterval mammography was recommended and this was repeated August 2012.  There was minimally a more prominent ductal ectasia and MRI was recommended for further evaluation.  This was performed on June 22, 2011, and it showed a 1.2 cm enhancing mass in the posterior aspect of an area of irregular linear enhancement.  Anteriorly to this, there was a 6 mm area which is the area of dilated duct noted by prior evaluation.   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 The tumor was estrogen receptor positive at 72%, progesterone receptor positive at 5%, with a very low MIB1 at 4%.  HER2 was not amplified with a ratio by C[facility] of 1.10.   The patient's subsequent history is as detailed below.  INTERVAL HISTORY:  [name] alone today for follow up of her breast cancer. She has been on tamoxifen since January 2013 and is tolerating it moderately well. She has hot flashes about every 30 minutes and they are particularly bothersome at night and wake her up. She has mild vaginal dryness managed with vaseline during intercourse because her husband is sensitive to [employer] The interval history is generally  unremarkable. She does line dancing for exercise and plays with her grandchildren.   REVIEW OF SYSTEMS:  PCozettadenies fevers, chills, nausea, vomiting, or changes in bowel or bladder habits. Her appetite is "too good." Her energy level waxes and wanes depending on how work is going. She can get very busy at times. This past Sunday and Monday she felt a sharp pressure to her chest accompanied by some dizziness for <10 seconds. She denies shortness of breath, cough, or palpitations. This is the first time she has ever experienced this pain, and has not felt any since this incident. A detailed review of systems is otherwise negative.    PAST MEDICAL HISTORY: Past Medical History  Diagnosis Date  . Alcoholism   . Arthritis   . Personal history of colonic polyps     TUBULAR ADENOMA  . Neuroendocrine cancer   . Hyperlipemia   . Esophageal reflux   . ADD (attention deficit disorder with hyperactivity)   . Anxiety states   . Depression   . Hiatal hernia   . GERD (gastroesophageal reflux disease)     severe   . IBS (irritable bowel syndrome)   . Breast cancer 2012    L breast, , ER/PR +, Her2 -  . Hx of radiation therapy 08/27/11 to 10/15/11    L breast    PAST SURGICAL HISTORY: Past Surgical History  Procedure Laterality Date  . Laparoscopic hysterectomy  1995  . Abdominal hysterectomy    . Breast lumpectomy  07/23/11    left breast  no BSO, still has both ovaries  FAMILY HISTORY Family History  Problem Relation Age of Onset  . Breast cancer Mother   . Cancer Mother     breast  . Colon cancer Neg Hx   . Diabetes Father   . Hypertension Father   . Cancer Brother     NEUROENDOCRINE  . Hypertension Brother   . Arthritis Brother     hips  The patient's father is alive at age 52.  The patient's mother is alive at age 87.  She was diagnosed with breast cancer at age 20.  The patient had 2 brothers; one of them died from a neuroendocrine tumor.  She has 1 sister.  There is no  other breast or ovarian cancer in the immediate family.  GYNECOLOGIC HISTORY:  (Reviewed 02/19/2014) She had menarche, age 24. She is GX P3 with first live birth at age 60. She had a hysterectomy in 1993, but did not have her ovaries removed.  She took hormone replacement for at least 2 years.    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  Her husband, April Robinson, works as a Administrator.  The patient's children from a prior marriage are:  April Robinson, 34, who lives in [place] and works as a Production assistant, radio.  April Robinson who works as a Pharmacist, hospital of ages between 52 and 12; she is also a Presenter, broadcasting.  April Robinson at [place], 29, who studies accounting at [employer]. The patient has 8 grandchildren. She is very active in her local church   ADVANCED DIRECTIVES:  HEALTH MAINTENANCE:  (Updated 04/27/215) History  Substance Use Topics  . Smoking status: Former Research scientist (life sciences)  . Smokeless tobacco: Never Used  . Alcohol Use: No     Colonoscopy: March 2012, Dr. Sharlett Robinson  PAP:  [facility], Dr. Toney Robinson  Bone density:  09/18/2013, normal  Lipid panel:  [facility], Dr. Etter Robinson   Allergies  Allergen Reactions  . Meloxicam Shortness Of Breath and Nausea Only  . Latex   . Miconazole Nitrate Other (See Comments)    SWELLING OF SOFT PALATE THAT CAUSED DIFFICULTY SWALLOWING    Current Outpatient Prescriptions  Medication Sig Dispense Refill  . DEXILANT 60 MG capsule TAKE 1 CAPSULE BY MOUTH DAILY 30 capsule 10  . tamoxifen (NOLVADEX) 20 MG tablet Take 1 tablet (20 mg total) by mouth daily. 90 tablet 3  . valACYclovir (VALTREX) 500 MG tablet Take twice daily for 3-5 days as needed 30 tablet 2  . IBUPROFEN IB PO Take by mouth as needed.       Current Facility-Administered Medications  Medication Dose Route Frequency Provider Last Rate Last Dose  . Influenza vac split quadrivalent PF  (FLUARIX) injection 0.5 mL  0.5 mL Intramuscular Once April Duster, NP        OBJECTIVE: Middle-aged [place] American woman  who appears well.  Filed Vitals:   10/17/14 0857  BP: 134/92  Pulse: 76  Temp: 98.3 F (36.8 C)  Resp: 20     Body mass index is 33.42 kg/(m^2).    ECOG FS: 0 Filed Weights   10/17/14 0857  Weight: 188 lb 9.6 oz (85.548 kg)   Physical Exam:  Skin: warm, dry  HEENT: sclerae anicteric, conjunctivae pink, oropharynx clear. No thrush or mucositis.  Lymph Nodes: No cervical or supraclavicular lymphadenopathy  Lungs: clear to auscultation bilaterally, no rales, wheezes,  or rhonci  Heart: regular rate and rhythm  Abdomen: round, soft, non tender, positive bowel sounds  Musculoskeletal: No focal spinal tenderness, no peripheral edema  Neuro: non focal, well oriented, positive affect  Breasts: left breast status post lumpectomy and radiation. No evidence recurrent disease. Left axilla benign. Right breast unremarkable.    LAB RESULTS: Lab Results  Component Value Date   WBC 5.3 09/28/2014   NEUTROABS 2.7 09/28/2014   HGB 13.6 09/28/2014   HCT 40.7 09/28/2014   MCV 85.1 09/28/2014   PLT 217 09/28/2014      Chemistry      Component Value Date/Time   NA 142 09/28/2014 1321   NA 141 04/24/2014 1610   K 4.0 09/28/2014 1321   K 3.4* 04/24/2014 1610   CL 105 04/24/2014 1610   CL 107 01/30/2013 1509   CO2 27 09/28/2014 1321   CO2 28 04/24/2014 1610   BUN 15.1 09/28/2014 1321   BUN 13 04/24/2014 1610   CREATININE 1.0 09/28/2014 1321   CREATININE 0.9 04/24/2014 1610   CREATININE 1.00 01/05/2014 1624      Component Value Date/Time   CALCIUM 9.4 09/28/2014 1321   CALCIUM 9.0 04/24/2014 1610   ALKPHOS 68 09/28/2014 1321   ALKPHOS 53 04/24/2014 1610   AST 17 09/28/2014 1321   AST 20 04/24/2014 1610   ALT 19 09/28/2014 1321   ALT 19 04/24/2014 1610   BILITOT 0.32 09/28/2014 1321   BILITOT 0.3 04/24/2014 1610        STUDIES:  Most recent  bilateral mammogram on 09/17/2014 was unremarkable.  A bone density on 09/18/2013 was normal.   ASSESSMENT: 54 y.o.  [place] woman   (1)  status post left lumpectomy and sentinel lymph node biopsy September of 2012 for a T1c N0, Stage IA  invasive ductal carcinoma, grade 2, estrogen and progesterone receptor positive with a very low MIB-1 and no HER-2 amplification.   (2)  She completed her adjuvant radiation 10/15/2011   (3) started tamoxifen 10/27/2011, the plan being to continue for total of 10 years until 2023.  (4) s/p remote TAH, with ovaries still intact   (5)  left shoulder pain -  to be evaluated by orthopedics   PLAN:  Jeraldine looks and feels well today. The labs were reviewed in detail and were entirely stable. She is tolerating the tamoxifen well and the goal is to continue this drug for a total of 10 years of antiestrogen therapy. We discussed her hot flashes and she was agreeable to starting 351m gabapentin QHS.  Her blood pressure was elevated to 134/92 today. She has been on blood pressure medicine in the past, but with diet and exercise she was able to be weaned off of it. I suggested she follow up with her primary care physician regarding this matter. In the meantime will exercise more, avoid salt, and increase her intake of fresh fruits and vegetables.   PDelethawill return in 1 year for labs and a follow up visit. She understands and agrees with this plan. She knows the goal of treatment in her case is cure. She has been encouraged to call with any issues that might arise before her next visit here.   FMarcelino Duster NP     10/17/2014

## 2014-10-17 NOTE — Telephone Encounter (Signed)
per pof to sch pt appt-gave pt copy of sch °

## 2014-10-17 NOTE — Addendum Note (Signed)
Addended by: Marcelino Duster on: 10/17/2014 11:11 AM   Modules accepted: Orders

## 2014-11-19 ENCOUNTER — Other Ambulatory Visit: Payer: Self-pay

## 2014-11-19 DIAGNOSIS — B009 Herpesviral infection, unspecified: Secondary | ICD-10-CM

## 2014-11-19 MED ORDER — VALACYCLOVIR HCL 500 MG PO TABS: ORAL_TABLET | ORAL | Status: DC

## 2014-12-12 ENCOUNTER — Encounter: Payer: Self-pay | Admitting: Women's Health

## 2014-12-12 ENCOUNTER — Ambulatory Visit (INDEPENDENT_AMBULATORY_CARE_PROVIDER_SITE_OTHER): Payer: 59 | Admitting: Women's Health

## 2014-12-12 VITALS — BP 126/80 | Wt 183.0 lb

## 2014-12-12 DIAGNOSIS — Z01419 Encounter for gynecological examination (general) (routine) without abnormal findings: Secondary | ICD-10-CM

## 2014-12-12 DIAGNOSIS — Z13220 Encounter for screening for lipoid disorders: Secondary | ICD-10-CM

## 2014-12-12 DIAGNOSIS — B009 Herpesviral infection, unspecified: Secondary | ICD-10-CM

## 2014-12-12 MED ORDER — VALACYCLOVIR HCL 500 MG PO TABS: ORAL_TABLET | ORAL | Status: DC

## 2014-12-12 NOTE — Patient Instructions (Signed)

## 2014-12-12 NOTE — Progress Notes (Signed)
Miela Desjardin Rasor 1961-08-31 [id number]    History:    Presents for annual exam.  1993 LAVH for fibroids. 2012 grade 2 invasive intraductal left breast cancer ER, PR positive HER-2 negative on tamoxifen per Dr. Jana Hakim.  2012, colonoscopy tubular adenoma. 2014 normal DEXA. HSV occasional outbreaks.  Past medical history, past surgical history, family history and social history were all reviewed and documented in the EPIC chart. Struggling at work with retaining information for the job. 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 Helping to care for elderly parents, has 3 daughters and 1 stepdaughter. One daughter lives with her with a 48 year old and 66-year-old. Good relationship with husband. Mother with Alzheimer's.  ROS:  A ROS was performed and pertinent positives and negatives are included.  Exam:  Filed Vitals:   12/12/14 1536  BP: 126/80    General appearance:  Normal Thyroid:  Symmetrical, normal in size, without palpable masses or nodularity. Respiratory  Auscultation:  Clear without wheezing or rhonchi Cardiovascular  Auscultation:  Regular rate, without rubs, murmurs or gallops  Edema/varicosities:  Not grossly evident Abdominal  Soft,nontender, without masses, guarding or rebound.  Liver/spleen:  No organomegaly noted  Hernia:  None appreciated  Skin  Inspection:  Grossly normal   Breasts: Examined lying and sitting.     Right: Without masses, retractions, discharge or axillary adenopathy.     Left: Without masses, retractions, discharge or axillary adenopathy. Gentitourinary   Inguinal/mons:  Normal without inguinal adenopathy  External genitalia:  Normal  BUS/Urethra/Skene's glands:  Normal  Vagina:  Normal  Cervix: Absent  Uterus: Absent  Adnexa/parametria:     Rt: Without masses or tenderness.   Lt: Without masses or tenderness.  Anus and perineum: Normal  Digital rectal exam: Normal sphincter tone without palpated masses or tenderness  Assessment/Plan:  54 y.o. MBF  G3 P3 for annual exam.    Complaint of forgetfulness/difficulty with retaining work information /increased stress  2012 grade 2 invasive left breast cancer on tamoxifen-Dr. Magninat manages 2012 tubular adenoma on colonoscopy 93 LAVH for fibroids HSV  Plan: Options reviewed counseling versus neurologist to evaluate forgetfulness. (mother Alzheimer's)  Will start with counselor, Almyra Free Whitt's name and number given instructed to schedule. Reviewed increased leisure, exercise, delegating more home responsibilities, taking time for self encouraged. SBE's, continue annual screening mammogram, calcium rich diet, vitamin D 2000 daily. Had CMP and CBC at [facility] 09/2014, lipid panel, vitamin D, TSH, UA, new screening Pap guidelines reviewed. Encouraged Prevnar 13, we'll get at primary care. Valtrex 500 twice daily for 3-5 days as needed, prescription, proper use given and reviewed.    [place], 4:59 PM 12/12/2014

## 2014-12-13 LAB — URINALYSIS W MICROSCOPIC + REFLEX CULTURE
Bilirubin Urine: NEGATIVE
Casts: NONE SEEN
Crystals: NONE SEEN
Glucose, UA: NEGATIVE mg/dL
Hgb urine dipstick: NEGATIVE
KETONES UR: NEGATIVE mg/dL
Leukocytes, UA: NEGATIVE
Nitrite: NEGATIVE
Protein, ur: NEGATIVE mg/dL
SPECIFIC GRAVITY, URINE: 1.021 (ref 1.005–1.030)
Urobilinogen, UA: 1 mg/dL (ref 0.0–1.0)
pH: 6 (ref 5.0–8.0)

## 2014-12-14 LAB — URINE CULTURE: Colony Count: 3000

## 2014-12-18 ENCOUNTER — Other Ambulatory Visit: Payer: 59

## 2014-12-19 ENCOUNTER — Other Ambulatory Visit: Payer: 59

## 2014-12-19 LAB — LIPID PANEL
CHOLESTEROL: 190 mg/dL (ref 0–200)
HDL: 39 mg/dL — ABNORMAL LOW (ref >=46)
LDL Cholesterol: 116 mg/dL — ABNORMAL HIGH (ref 0–99)
Total CHOL/HDL Ratio: 4.9 Ratio
Triglycerides: 176 mg/dL — ABNORMAL HIGH (ref <150)
VLDL: 35 mg/dL (ref 0–40)

## 2014-12-19 LAB — TSH: TSH: 1.405 u[IU]/mL (ref 0.350–4.500)

## 2014-12-20 LAB — VITAMIN D 25 HYDROXY (VIT D DEFICIENCY, FRACTURES): Vit D, 25-Hydroxy: 9 ng/mL — ABNORMAL LOW (ref 30–100)

## 2014-12-24 ENCOUNTER — Other Ambulatory Visit: Payer: Self-pay | Admitting: Women's Health

## 2014-12-24 DIAGNOSIS — E559 Vitamin D deficiency, unspecified: Secondary | ICD-10-CM

## 2014-12-24 MED ORDER — VITAMIN D (ERGOCALCIFEROL) 1.25 MG (50000 UNIT) PO CAPS: 50000.0000 [IU] | ORAL_CAPSULE | ORAL | Status: DC

## 2015-01-03 ENCOUNTER — Telehealth: Payer: Self-pay

## 2015-01-03 DIAGNOSIS — E559 Vitamin D deficiency, unspecified: Secondary | ICD-10-CM

## 2015-01-03 NOTE — Telephone Encounter (Signed)
Please call, reorder 50,000 weekly for 12 weeks, and then recheck, vitamin D cannot be absorbed in high quantities,  most likely not effective.

## 2015-01-03 NOTE — Telephone Encounter (Signed)
On 12/19/14 you wrote "Notes Recorded by Huel Cote, NP on 12/20/2014 at 7:23 AM Please call and review vitamin D level extremely low, 50,000 vitamin D weekly for 12 weeks, recheck, most likely will need to do for annual another 12 weeks and then can do over-the-counter vitamin D 2000.  I advised patient of this. However, today she called because she only has two pills left and wondered about coming for recheck on Vit D level.  Upon discussion she realized she has taken them incorrectly and has taken one daily instead of one weekly. She read the instruction on the bottle to me and it said every 7 days. She said she feels great and normal again.  So with that said, what to do about when to have her recheck VIt D level. She does have two pills left and said she will hold off and take one next week and then another the next week.

## 2015-01-04 NOTE — Telephone Encounter (Signed)
Left message to call.

## 2015-01-10 ENCOUNTER — Encounter: Payer: Self-pay | Admitting: Women's Health

## 2015-01-10 ENCOUNTER — Ambulatory Visit (INDEPENDENT_AMBULATORY_CARE_PROVIDER_SITE_OTHER): Payer: 59 | Admitting: Women's Health

## 2015-01-10 VITALS — BP 140/82 | Ht 63.0 in | Wt 180.0 lb

## 2015-01-10 DIAGNOSIS — B373 Candidiasis of vulva and vagina: Secondary | ICD-10-CM

## 2015-01-10 DIAGNOSIS — R350 Frequency of micturition: Secondary | ICD-10-CM

## 2015-01-10 DIAGNOSIS — N898 Other specified noninflammatory disorders of vagina: Secondary | ICD-10-CM

## 2015-01-10 DIAGNOSIS — B3731 Acute candidiasis of vulva and vagina: Secondary | ICD-10-CM

## 2015-01-10 LAB — URINALYSIS W MICROSCOPIC + REFLEX CULTURE
Bilirubin Urine: NEGATIVE
CASTS: NONE SEEN
Crystals: NONE SEEN
Glucose, UA: NEGATIVE mg/dL
Ketones, ur: NEGATIVE mg/dL
Leukocytes, UA: NEGATIVE
NITRITE: NEGATIVE
PH: 5.5 (ref 5.0–8.0)
PROTEIN: NEGATIVE mg/dL
SPECIFIC GRAVITY, URINE: 1.02 (ref 1.005–1.030)
UROBILINOGEN UA: 0.2 mg/dL (ref 0.0–1.0)

## 2015-01-10 LAB — WET PREP FOR TRICH, YEAST, CLUE
CLUE CELLS WET PREP: NONE SEEN
Trich, Wet Prep: NONE SEEN

## 2015-01-10 MED ORDER — FLUCONAZOLE 150 MG PO TABS: 150.0000 mg | ORAL_TABLET | Freq: Once | ORAL | Status: DC

## 2015-01-10 NOTE — Progress Notes (Signed)
Patient ID: April Robinson, female   DOB: Nov 23, 1960, 54 y.o.   MRN: [id number] Presents with increased vaginal burning, irritation, urinary frequency. Denies abdominal pain, fever, pain at end of stream of urination. Minimal discharge and no odor. LAVH for fibroids. 2012 left breast cancer on tamoxifen. Exercising more.  Exam: Appears well. External genitalia erythematous at introitus, speculum exam scant white discharge vaginal walls erythematous, wet prep positive for yeast. UA: Trace blood, 0-2 RBCs, few bacteria, positive for yeast.  Yeast vaginitis  Plan: Diflucan 150 by mouth 1 dose, allergic to miconazole, states has had no problems with Diflucan in the past. Yeast prevention discussed. Call if no relief. Urine culture pending.

## 2015-01-10 NOTE — Patient Instructions (Signed)

## 2015-01-11 LAB — URINE CULTURE
COLONY COUNT: NO GROWTH
ORGANISM ID, BACTERIA: NO GROWTH

## 2015-01-14 NOTE — Telephone Encounter (Signed)
Left message for patient to call me on home ph ans machine.

## 2015-02-06 MED ORDER — VITAMIN D (ERGOCALCIFEROL) 1.25 MG (50000 UNIT) PO CAPS: 50000.0000 [IU] | ORAL_CAPSULE | ORAL | Status: DC

## 2015-02-06 NOTE — Telephone Encounter (Signed)
I reached patient today and advised her regarding doing the Vit D 50000 for 12 weeks again and recheck level at end of it. She agrees. Order and recall placed.

## 2015-02-19 ENCOUNTER — Other Ambulatory Visit: Payer: Self-pay

## 2015-02-19 DIAGNOSIS — B009 Herpesviral infection, unspecified: Secondary | ICD-10-CM

## 2015-02-19 MED ORDER — VALACYCLOVIR HCL 500 MG PO TABS: ORAL_TABLET | ORAL | Status: DC

## 2015-03-12 ENCOUNTER — Other Ambulatory Visit: Payer: Self-pay

## 2015-04-11 ENCOUNTER — Encounter: Payer: Self-pay | Admitting: Medical

## 2015-04-11 ENCOUNTER — Ambulatory Visit (INDEPENDENT_AMBULATORY_CARE_PROVIDER_SITE_OTHER): Payer: 59 | Admitting: Medical

## 2015-04-11 ENCOUNTER — Ambulatory Visit: Payer: Self-pay | Admitting: Medical

## 2015-04-11 ENCOUNTER — Ambulatory Visit (HOSPITAL_BASED_OUTPATIENT_CLINIC_OR_DEPARTMENT_OTHER)
Admission: RE | Admit: 2015-04-11 | Discharge: 2015-04-11 | Disposition: A | Discharge status: Home / Self Care | Payer: 59 | Source: Ambulatory Visit | Attending: Medical | Admitting: Medical

## 2015-04-11 VITALS — BP 144/84 | HR 74 | Temp 97.9°F | Ht 63.0 in | Wt 182.4 lb

## 2015-04-11 DIAGNOSIS — M25562 Pain in left knee: Secondary | ICD-10-CM | POA: Diagnosis present

## 2015-04-11 DIAGNOSIS — M79609 Pain in unspecified limb: Secondary | ICD-10-CM | POA: Diagnosis not present

## 2015-04-11 DIAGNOSIS — M179 Osteoarthritis of knee, unspecified: Secondary | ICD-10-CM | POA: Diagnosis not present

## 2015-04-11 DIAGNOSIS — M25462 Effusion, left knee: Secondary | ICD-10-CM | POA: Diagnosis not present

## 2015-04-11 NOTE — Progress Notes (Signed)
Pre visit review using our clinic review tool, if applicable. No additional management support is needed unless otherwise documented below in the visit note. 

## 2015-04-11 NOTE — Assessment & Plan Note (Signed)
Order lower ext [place] left side.

## 2015-04-11 NOTE — Patient Instructions (Signed)
Knee pain, left Xray today. RICE. 2 alleve twice a day. May refer to sports medicine or ortho.  Popliteal pain Order lower ext [place] left side.   Follow up in 7 days or as needed

## 2015-04-11 NOTE — Assessment & Plan Note (Addendum)
Xray today. RICE. 2 alleve twice a day. May refer to sports medicine or ortho.

## 2015-04-11 NOTE — Progress Notes (Signed)
Subjective:    Patient ID: April Robinson, female    DOB: 09-26-1961, 54 y.o.   MRN: [id number]  HPI  Pt in with left knee pain. She was doing line dancing exercise. Moderate intense. Little less intense than zumba.   Pt thinks the dancing  exercise caused pain. But did not feel acute pain at time of injury.   Pain has been present for 10 days. No sob or wheezing.  Pt when she walks knee will pop.  Pt did not rest. Continued to drive her her camaro which requires a clutch and was dancing over past 10 days     Review of Systems  Constitutional: Negative for fever, chills and fatigue.  Respiratory: Negative for cough, chest tightness, shortness of breath and wheezing.   Cardiovascular: Negative for chest pain and palpitations.  Musculoskeletal:       Rt knee pain. Some pain upper rt calf and some faint pain popliteal area.  Neurological: Negative for dizziness and light-headedness.  Hematological: Negative for adenopathy. Does not bruise/bleed easily.  Psychiatric/Behavioral: Negative for behavioral problems and confusion.     Past Medical History  Diagnosis Date  . Alcoholism   . Arthritis   . Personal history of colonic polyps     TUBULAR ADENOMA  . Hyperlipemia   . Esophageal reflux   . ADD (attention deficit disorder with hyperactivity)   . Hiatal hernia   . Hx of radiation therapy 08/27/11 to 10/15/11    L breast    History   Social History  . Marital Status: Married    Spouse Name: Nathaneil Canary  . Number of Children: 3  . Years of Education: 12+   Occupational History  . Customer Service   .     Social History Main Topics  . Smoking status: Former Research scientist (life sciences)  . Smokeless tobacco: Never Used  . Alcohol Use: No  . Drug Use: No  . Sexual Activity:    Partners: Male    Birth Control/ Protection: Surgical     Comment: [name]  , SEXUAL PARTNERS MORE THAN 5   Other Topics Concern  . Not on file   Social History Narrative   Lives with her  husband. Their children (her 3 and his one) live locally.    Past Surgical History  Procedure Laterality Date  . Laparoscopic hysterectomy  1995  . Abdominal hysterectomy    . Breast lumpectomy  07/23/11    left breast    Family History  Problem Relation Age of Onset  . Breast cancer Mother   . Cancer Mother     breast  . Colon cancer Neg Hx   . Diabetes Father   . Hypertension Father   . Cancer Brother     NEUROENDOCRINE  . Hypertension Brother   . Arthritis Brother     hips    Allergies  Allergen Reactions  . Meloxicam Shortness Of Breath and Nausea Only  . Latex   . Miconazole Nitrate Other (See Comments)    SWELLING OF SOFT PALATE THAT CAUSED DIFFICULTY SWALLOWING    Current Outpatient Prescriptions on File Prior to Visit  Medication Sig Dispense Refill  . IBUPROFEN IB PO Take by mouth as needed.      . tamoxifen (NOLVADEX) 20 MG tablet Take 1 tablet (20 mg total) by mouth daily. 90 tablet 3  . valACYclovir (VALTREX) 500 MG tablet Take twice daily for 3-5 days as needed 30 tablet 12  . Vitamin D, Ergocalciferol, (DRISDOL)  50000 UNITS CAPS capsule Take 1 capsule (50,000 Units total) by mouth every 7 (seven) days. 12 capsule 0  . DEXILANT 60 MG capsule TAKE 1 CAPSULE BY MOUTH DAILY 30 capsule 10   No current facility-administered medications on file prior to visit.    BP 144/84 mmHg  Pulse 74  Temp(Src) 97.9 F (36.6 C) (Oral)  Ht 5\' 3"  (1.6 m)  Wt 182 lb 6.4 oz (82.736 kg)  BMI 32.32 kg/m2  SpO2 100%       Objective:   Physical Exam  General- No acute distress. Pleasant patient. Lt knee- moderate swelling. Medial and lateral ligament aspect tender. Faint popliteal bulge and tender. Upper calf mild tender.       Assessment & Plan:

## 2015-04-12 ENCOUNTER — Telehealth: Payer: Self-pay | Admitting: Family Medicine

## 2015-04-12 ENCOUNTER — Other Ambulatory Visit: Payer: Self-pay

## 2015-04-12 MED ORDER — TRAMADOL HCL 50 MG PO TABS: 50.0000 mg | ORAL_TABLET | Freq: Four times a day (QID) | ORAL | Status: DC | PRN

## 2015-04-12 NOTE — Telephone Encounter (Signed)
rx tramadol

## 2015-04-12 NOTE — Telephone Encounter (Signed)
Patient gave wrong number for Dr. Harvie Heck to call her back for test results- was seen 04/11/15 [phone] - mobile number

## 2015-04-19 ENCOUNTER — Other Ambulatory Visit: Payer: Self-pay

## 2015-04-19 MED ORDER — DEXLANSOPRAZOLE 60 MG PO CPDR: 1.0000 | DELAYED_RELEASE_CAPSULE | Freq: Every day | ORAL | Status: DC

## 2015-04-23 ENCOUNTER — Ambulatory Visit (INDEPENDENT_AMBULATORY_CARE_PROVIDER_SITE_OTHER): Payer: 59 | Admitting: Family Medicine

## 2015-04-23 ENCOUNTER — Encounter: Payer: Self-pay | Admitting: Family Medicine

## 2015-04-23 VITALS — BP 122/75 | HR 76 | Temp 98.3°F | Ht 63.0 in | Wt 181.0 lb

## 2015-04-23 DIAGNOSIS — M25562 Pain in left knee: Secondary | ICD-10-CM | POA: Insufficient documentation

## 2015-04-23 NOTE — Progress Notes (Signed)
Patient ID: April Robinson, female    DOB: [birth year]  Age: 54 y.o. MRN: [id number]    Subjective:  Subjective HPI [name] presents for L knee pain since doing plyometrics at gym (lots of jumping)-- started 10 days prior to last visit on 6/16. Xray showed effusion in L knee.  [place] neg for dvt.  Pt has been taking IB with some relief.   Review of Systems  Constitutional: Negative for diaphoresis, appetite change, fatigue and unexpected weight change.  Eyes: Negative for pain, redness and visual disturbance.  Cardiovascular: Negative for chest pain, palpitations and leg swelling.  Endocrine: Negative for cold intolerance, heat intolerance, polydipsia, polyphagia and polyuria.  Genitourinary: Negative for dysuria, frequency and difficulty urinating.  Musculoskeletal: Positive for joint swelling and arthralgias.       L knee pain  Neurological: Negative for dizziness, light-headedness, numbness and headaches.    History Past Medical History  Diagnosis Date  . Alcoholism   . Arthritis   . Personal history of colonic polyps     TUBULAR ADENOMA  . Hyperlipemia   . Esophageal reflux   . ADD (attention deficit disorder with hyperactivity)   . Hiatal hernia   . Hx of radiation therapy 08/27/11 to 10/15/11    L breast    She has past surgical history that includes Laparoscopic hysterectomy (1995); Abdominal hysterectomy; and Breast lumpectomy (07/23/11).   Her family history includes Arthritis in her brother; Breast cancer in her mother; Cancer in her brother and mother; Diabetes in her father; Hypertension in her brother and father. There is no history of Colon cancer.She reports that she has quit smoking. She has never used smokeless tobacco. She reports that she does not drink alcohol or use illicit drugs.  Current Outpatient Prescriptions on File Prior to Visit  Medication Sig Dispense Refill  . dexlansoprazole (DEXILANT) 60 MG capsule Take 1 capsule (60 mg total) by mouth daily.  30 capsule 11  . tamoxifen (NOLVADEX) 20 MG tablet Take 1 tablet (20 mg total) by mouth daily. 90 tablet 3  . valACYclovir (VALTREX) 500 MG tablet Take twice daily for 3-5 days as needed 30 tablet 12  . Vitamin D, Ergocalciferol, (DRISDOL) 50000 UNITS CAPS capsule Take 1 capsule (50,000 Units total) by mouth every 7 (seven) days. 12 capsule 0   No current facility-administered medications on file prior to visit.     Objective:  Objective Physical Exam  Constitutional: She is oriented to person, place, and time. She appears well-developed and well-nourished.  Neck: Neck supple. No JVD present. Carotid bruit is not present. No thyromegaly present.  Cardiovascular: Regular rhythm.   No murmur heard. Pulmonary/Chest: No respiratory distress. She has no wheezes. She has no rales. She exhibits no tenderness.  Musculoskeletal: She exhibits edema and tenderness.       Left knee: She exhibits decreased range of motion, swelling and effusion. Tenderness found. Medial joint line tenderness noted.  Neurological: She is alert and oriented to person, place, and time.  Psychiatric: She has a normal mood and affect.   BP 122/75 mmHg  Pulse 76  Temp(Src) 98.3 F (36.8 C) (Oral)  Ht 5\' 3"  (1.6 m)  Wt 181 lb (82.101 kg)  BMI 32.07 kg/m2  SpO2 97% Wt Readings from Last 3 Encounters:  04/23/15 181 lb (82.101 kg)  04/11/15 182 lb 6.4 oz (82.736 kg)  01/10/15 180 lb (81.647 kg)     Lab Results  Component Value Date   WBC 5.3 09/28/2014  HGB 13.6 09/28/2014   HCT 40.7 09/28/2014   PLT 217 09/28/2014   GLUCOSE 81 09/28/2014   CHOL 190 12/19/2014   TRIG 176* 12/19/2014   HDL 39* 12/19/2014   LDLCALC 116* 12/19/2014   ALT 19 09/28/2014   AST 17 09/28/2014   NA 142 09/28/2014   K 4.0 09/28/2014   CL 105 04/24/2014   CREATININE 1.0 09/28/2014   BUN 15.1 09/28/2014   CO2 27 09/28/2014   TSH 1.405 12/19/2014    US Venous Img Lower Unilateral Left  04/11/2015   CLINICAL DATA:  Acute onset  of left popliteal pain for 10 days. Initial encounter.  EXAM: LEFT LOWER EXTREMITY VENOUS DOPPLER ULTRASOUND  TECHNIQUE: Gray-scale sonography with graded compression, as well as color Doppler and duplex ultrasound were performed to evaluate the lower extremity deep venous systems from the level of the common femoral vein and including the common femoral, femoral, profunda femoral, popliteal and calf veins including the posterior tibial, peroneal and gastrocnemius veins when visible. The superficial great saphenous vein was also interrogated. Spectral Doppler was utilized to evaluate flow at rest and with distal augmentation maneuvers in the common femoral, femoral and popliteal veins.  COMPARISON:  Left knee radiographs performed earlier today at 6:55 p.m.  FINDINGS: Contralateral Common Femoral Vein: Respiratory phasicity is normal and symmetric with the symptomatic side. No evidence of thrombus. Normal compressibility.  Common Femoral Vein: No evidence of thrombus. Normal compressibility, respiratory phasicity and response to augmentation.  Saphenofemoral Junction: No evidence of thrombus. Normal compressibility and flow on color Doppler imaging.  Profunda Femoral Vein: No evidence of thrombus. Normal compressibility and flow on color Doppler imaging.  Femoral Vein: No evidence of thrombus. Normal compressibility, respiratory phasicity and response to augmentation.  Popliteal Vein: No evidence of thrombus. Normal compressibility, respiratory phasicity and response to augmentation.  Calf Veins: No evidence of thrombus. Normal compressibility and flow on color Doppler imaging.  Superficial Great Saphenous Vein: No evidence of thrombus. Normal compressibility and flow on color Doppler imaging.  Venous Reflux:  None.  Other Findings:  A moderate to large knee joint effusion is noted.  IMPRESSION: 1. No evidence of deep venous thrombosis. 2. Moderate to large knee joint effusion noted.   Electronically Signed   By:  Garald Balding M.D.   On: 04/11/2015 19:45   Dg Knee Complete 4 Views Left  04/11/2015   CLINICAL DATA:  Left knee pain for 10 days.  No known injury.  EXAM: LEFT KNEE - COMPLETE 4+ VIEW  COMPARISON:  None.  FINDINGS: There is no evidence of fracture or dislocation. There is mild medial femorotibial compartment joint space narrowing. There is a moderate joint effusion. Soft tissues are unremarkable.  IMPRESSION: 1. Moderate left knee joint effusion. 2. No acute osseous injury of the left knee. 3. Mild medial femorotibial compartment osteoarthritis.   Electronically Signed   By: Kathreen Devoid   On: 04/11/2015 19:09     Assessment & Plan:  Plan I have discontinued Ms. Vanvoorhis's IBUPROFEN IB PO and traMADol. I am also having her maintain her tamoxifen, Vitamin D (Ergocalciferol), valACYclovir, and dexlansoprazole.  No orders of the defined types were placed in this encounter.    Problem List Items Addressed This Visit    Left knee pain - Primary    Ice, elevated Aleve prn Pt if no better in a week vs sport med         Follow-up: No Follow-up on file.  Garnet Koyanagi, DO

## 2015-04-23 NOTE — Assessment & Plan Note (Signed)
Ice, elevated Aleve prn Pt if no better in a week vs sport med

## 2015-04-23 NOTE — Patient Instructions (Signed)

## 2015-04-23 NOTE — Progress Notes (Signed)
Pre visit review using our clinic review tool, if applicable. No additional management support is needed unless otherwise documented below in the visit note. 

## 2015-05-05 ENCOUNTER — Other Ambulatory Visit: Payer: Self-pay | Admitting: Gynecology

## 2015-07-29 ENCOUNTER — Encounter: Payer: Self-pay | Admitting: Family Medicine

## 2015-07-29 ENCOUNTER — Ambulatory Visit (INDEPENDENT_AMBULATORY_CARE_PROVIDER_SITE_OTHER): Payer: 59 | Admitting: Family Medicine

## 2015-07-29 VITALS — BP 116/76 | HR 82 | Temp 98.3°F | Wt 182.0 lb

## 2015-07-29 DIAGNOSIS — H60393 Other infective otitis externa, bilateral: Secondary | ICD-10-CM

## 2015-07-29 MED ORDER — OFLOXACIN 0.3 % OT SOLN: 10.0000 [drp] | Freq: Every day | OTIC | Status: DC

## 2015-07-29 NOTE — Progress Notes (Signed)
Patient ID: April Robinson, female   DOB: Apr 23, 1961, 54 y.o.   MRN: [id number]   Subjective:    Patient ID: April Robinson, female    DOB: 1960-11-17, 54 y.o.   MRN: [id number]  Chief Complaint  Patient presents with  . Ear Pain    c/o pain to the left ear x's a few mos    HPI Patient is in today for c/o ear pain L>R x 2-3 months and it is getting worse  Past Medical History  Diagnosis Date  . Alcoholism ([place])   . Arthritis   . Personal history of colonic polyps     TUBULAR ADENOMA  . Hyperlipemia   . Esophageal reflux   . ADD (attention deficit disorder with hyperactivity)   . Hiatal hernia   . Hx of radiation therapy 08/27/11 to 10/15/11    L breast    Past Surgical History  Procedure Laterality Date  . Laparoscopic hysterectomy  1995  . Abdominal hysterectomy    . Breast lumpectomy  07/23/11    left breast    Family History  Problem Relation Age of Onset  . Breast cancer Mother   . Cancer Mother     breast  . Colon cancer Neg Hx   . Diabetes Father   . Hypertension Father   . Cancer Brother     NEUROENDOCRINE  . Hypertension Brother   . Arthritis Brother     hips    Social History   Social History  . Marital Status: Married    Spouse Name: Nathaneil Canary  . Number of Children: 3  . Years of Education: 12+   Occupational History  . Customer Service   .     Social History Main Topics  . Smoking status: Former Research scientist (life sciences)  . Smokeless tobacco: Never Used  . Alcohol Use: No  . Drug Use: No  . Sexual Activity:    Partners: Male    Birth Control/ Protection: Surgical     Comment: [name]  , SEXUAL PARTNERS MORE THAN 5   Other Topics Concern  . Not on file   Social History Narrative   Lives with her husband. Their children (her 3 and his one) live locally.    Outpatient Prescriptions Prior to Visit  Medication Sig Dispense Refill  . dexlansoprazole (DEXILANT) 60 MG capsule Take 1 capsule (60 mg total) by mouth daily. 30 capsule 11  .  tamoxifen (NOLVADEX) 20 MG tablet Take 1 tablet (20 mg total) by mouth daily. 90 tablet 3  . valACYclovir (VALTREX) 500 MG tablet Take twice daily for 3-5 days as needed 30 tablet 12  . Vitamin D, Ergocalciferol, (DRISDOL) 50000 UNITS CAPS capsule Take 1 capsule (50,000 Units total) by mouth every 7 (seven) days. 12 capsule 0   No facility-administered medications prior to visit.    Allergies  Allergen Reactions  . Meloxicam Shortness Of Breath and Nausea Only  . Latex   . Miconazole Nitrate Other (See Comments)    SWELLING OF SOFT PALATE THAT CAUSED DIFFICULTY SWALLOWING    Review of Systems  Constitutional: Negative for fever and malaise/fatigue.  HENT: Positive for ear pain. Negative for congestion.   Eyes: Negative for discharge.  Respiratory: Negative for shortness of breath.   Cardiovascular: Negative for chest pain, palpitations and leg swelling.  Gastrointestinal: Negative for nausea and abdominal pain.  Genitourinary: Negative for dysuria.  Musculoskeletal: Negative for falls.  Skin: Negative for rash.  Neurological: Negative for loss of consciousness and  headaches.  Endo/Heme/Allergies: Negative for environmental allergies.  Psychiatric/Behavioral: Negative for depression. The patient is not nervous/anxious.        Objective:    Physical Exam  Constitutional: She is oriented to person, place, and time. She appears well-developed and well-nourished.  HENT:  Head: Normocephalic and atraumatic.  Right Ear: Hearing normal. There is tenderness.  Left Ear: Hearing normal. There is tenderness.  Ears:  Eyes: Conjunctivae and EOM are normal.  Neck: Normal range of motion. Neck supple. No JVD present. Carotid bruit is not present. No thyromegaly present.  Cardiovascular: Normal rate, regular rhythm and normal heart sounds.   No murmur heard. Pulmonary/Chest: Effort normal and breath sounds normal. No respiratory distress. She has no wheezes. She has no rales. She exhibits  no tenderness.  Musculoskeletal: She exhibits no edema.  Neurological: She is alert and oriented to person, place, and time.  Psychiatric: She has a normal mood and affect.    BP 116/76 mmHg  Pulse 82  Temp(Src) 98.3 F (36.8 C) (Oral)  Wt 182 lb (82.555 kg)  SpO2 98% Wt Readings from Last 3 Encounters:  07/29/15 182 lb (82.555 kg)  04/23/15 181 lb (82.101 kg)  04/11/15 182 lb 6.4 oz (82.736 kg)     Lab Results  Component Value Date   WBC 5.3 09/28/2014   HGB 13.6 09/28/2014   HCT 40.7 09/28/2014   PLT 217 09/28/2014   GLUCOSE 81 09/28/2014   CHOL 190 12/19/2014   TRIG 176* 12/19/2014   HDL 39* 12/19/2014   LDLCALC 116* 12/19/2014   ALT 19 09/28/2014   AST 17 09/28/2014   NA 142 09/28/2014   K 4.0 09/28/2014   CL 105 04/24/2014   CREATININE 1.0 09/28/2014   BUN 15.1 09/28/2014   CO2 27 09/28/2014   TSH 1.405 12/19/2014    Lab Results  Component Value Date   TSH 1.405 12/19/2014   Lab Results  Component Value Date   WBC 5.3 09/28/2014   HGB 13.6 09/28/2014   HCT 40.7 09/28/2014   MCV 85.1 09/28/2014   PLT 217 09/28/2014   Lab Results  Component Value Date   NA 142 09/28/2014   K 4.0 09/28/2014   CHLORIDE 108 09/28/2014   CO2 27 09/28/2014   GLUCOSE 81 09/28/2014   BUN 15.1 09/28/2014   CREATININE 1.0 09/28/2014   BILITOT 0.32 09/28/2014   ALKPHOS 68 09/28/2014   AST 17 09/28/2014   ALT 19 09/28/2014   PROT 7.0 09/28/2014   ALBUMIN 3.7 09/28/2014   CALCIUM 9.4 09/28/2014   ANIONGAP 8 09/28/2014   EGFR 76* 09/28/2014   GFR 79.66 04/24/2014   Lab Results  Component Value Date   CHOL 190 12/19/2014   Lab Results  Component Value Date   HDL 39* 12/19/2014   Lab Results  Component Value Date   LDLCALC 116* 12/19/2014   Lab Results  Component Value Date   TRIG 176* 12/19/2014   Lab Results  Component Value Date   CHOLHDL 4.9 12/19/2014   No results found for: HGBA1C     Assessment & Plan:   Problem List Items Addressed This  Visit    None    Visit Diagnoses    Otitis, externa, infective, bilateral    -  Primary    Relevant Medications    ofloxacin (FLOXIN) 0.3 % otic solution     pt will also try to get a different head set for work -- call if symptoms do not improve  I have discontinued Ms. Baldwin's Vitamin D (Ergocalciferol). I am also having her start on ofloxacin. Additionally, I am having her maintain her tamoxifen, valACYclovir, and dexlansoprazole.  Meds ordered this encounter  Medications  . ofloxacin (FLOXIN) 0.3 % otic solution    Sig: Place 10 drops into both ears daily.    Dispense:  5 mL    Refill:  0     Garnet Koyanagi, DO

## 2015-07-29 NOTE — Patient Instructions (Addendum)

## 2015-07-29 NOTE — Progress Notes (Signed)
Pre visit review using our clinic review tool, if applicable. No additional management support is needed unless otherwise documented below in the visit note. 

## 2015-08-13 ENCOUNTER — Encounter (INDEPENDENT_AMBULATORY_CARE_PROVIDER_SITE_OTHER): Payer: Self-pay

## 2015-08-13 ENCOUNTER — Encounter: Payer: Self-pay | Admitting: Family Medicine

## 2015-08-13 ENCOUNTER — Ambulatory Visit (INDEPENDENT_AMBULATORY_CARE_PROVIDER_SITE_OTHER): Payer: 59 | Admitting: Family Medicine

## 2015-08-13 ENCOUNTER — Ambulatory Visit (HOSPITAL_BASED_OUTPATIENT_CLINIC_OR_DEPARTMENT_OTHER)
Admission: RE | Admit: 2015-08-13 | Discharge: 2015-08-13 | Disposition: A | Discharge status: Home / Self Care | Payer: 59 | Source: Ambulatory Visit | Attending: Family Medicine | Admitting: Family Medicine

## 2015-08-13 VITALS — BP 140/88 | HR 67 | Temp 98.2°F | Wt 184.0 lb

## 2015-08-13 DIAGNOSIS — R1032 Left lower quadrant pain: Secondary | ICD-10-CM

## 2015-08-13 DIAGNOSIS — R109 Unspecified abdominal pain: Secondary | ICD-10-CM | POA: Diagnosis not present

## 2015-08-13 DIAGNOSIS — N760 Acute vaginitis: Secondary | ICD-10-CM

## 2015-08-13 DIAGNOSIS — K5732 Diverticulitis of large intestine without perforation or abscess without bleeding: Secondary | ICD-10-CM

## 2015-08-13 DIAGNOSIS — R197 Diarrhea, unspecified: Secondary | ICD-10-CM

## 2015-08-13 DIAGNOSIS — H60542 Acute eczematoid otitis externa, left ear: Secondary | ICD-10-CM

## 2015-08-13 DIAGNOSIS — J0110 Acute frontal sinusitis, unspecified: Secondary | ICD-10-CM

## 2015-08-13 MED ORDER — FLUTICASONE PROPIONATE 50 MCG/ACT NA SUSP: 2.0000 | Freq: Every day | NASAL | Status: DC

## 2015-08-13 MED ORDER — FLUCONAZOLE 150 MG PO TABS: ORAL_TABLET | ORAL | Status: DC

## 2015-08-13 MED ORDER — AMOXICILLIN-POT CLAVULANATE 875-125 MG PO TABS
1.0000 | ORAL_TABLET | Freq: Two times a day (BID) | ORAL | Status: DC
Start: 2015-08-13 — End: 2015-11-13

## 2015-08-13 MED ORDER — HYDROCORTISONE-ACETIC ACID 1-2 % OT SOLN: 4.0000 [drp] | Freq: Three times a day (TID) | OTIC | Status: DC

## 2015-08-13 MED ORDER — IOHEXOL 300 MG/ML  SOLN
100.0000 mL | Freq: Once | INTRAMUSCULAR | Status: AC | PRN
Start: 2015-08-13 — End: 2015-08-13
  Administered 2015-08-13: 100 mL via INTRAVENOUS

## 2015-08-13 NOTE — Progress Notes (Signed)
Patient ID: April Robinson, female    DOB: 12-18-1960  Age: 54 y.o. MRN: [id number]    Subjective:  Subjective HPI April Robinson presents for 3 weeks of nausea / vomiting/ diarrhea --- other people at work have been sick with a variety of things.  No blood in toilet.  She did do an e visit last week and she was given antinausea meds but she did not pick it up because she thought she was getting better but she is not.   + chills. She is not c/o congestion over weekend ---- + cough, dry.    Review of Systems  Constitutional: Positive for chills. Negative for fever.  HENT: Positive for congestion, ear discharge, ear pain, postnasal drip, rhinorrhea and sinus pressure.   Respiratory: Positive for cough. Negative for chest tightness, shortness of breath and wheezing.   Cardiovascular: Negative for chest pain, palpitations and leg swelling.  Gastrointestinal: Positive for nausea, vomiting, abdominal pain and diarrhea. Negative for constipation, blood in stool, abdominal distention and anal bleeding.  Genitourinary: Negative for dysuria, urgency and frequency.  Musculoskeletal: Negative for myalgias and arthralgias.  Skin: Positive for color change and rash. Negative for wound.  Allergic/Immunologic: Negative for environmental allergies.  Neurological: Negative for dizziness, weakness, numbness and headaches.  Psychiatric/Behavioral: Negative for self-injury, dysphoric mood and decreased concentration. The patient is not nervous/anxious.     History Past Medical History  Diagnosis Date  . Alcoholism ([place])   . Arthritis   . Personal history of colonic polyps     TUBULAR ADENOMA  . Hyperlipemia   . Esophageal reflux   . ADD (attention deficit disorder with hyperactivity)   . Hiatal hernia   . Hx of radiation therapy 08/27/11 to 10/15/11    L breast    She has past surgical history that includes Laparoscopic hysterectomy (1995); Abdominal hysterectomy; and Breast lumpectomy (07/23/11).    Her family history includes Arthritis in her brother; Breast cancer in her mother; Cancer in her brother and mother; Diabetes in her father; Hypertension in her brother and father. There is no history of Colon cancer.She reports that she has quit smoking. She has never used smokeless tobacco. She reports that she does not drink alcohol or use illicit drugs.  Current Outpatient Prescriptions on File Prior to Visit  Medication Sig Dispense Refill  . dexlansoprazole (DEXILANT) 60 MG capsule Take 1 capsule (60 mg total) by mouth daily. 30 capsule 11  . ofloxacin (FLOXIN) 0.3 % otic solution Place 10 drops into both ears daily. 5 mL 0  . tamoxifen (NOLVADEX) 20 MG tablet Take 1 tablet (20 mg total) by mouth daily. 90 tablet 3  . valACYclovir (VALTREX) 500 MG tablet Take twice daily for 3-5 days as needed 30 tablet 12   No current facility-administered medications on file prior to visit.     Objective:  Objective Physical Exam  Constitutional: She is oriented to person, place, and time. She appears well-developed and well-nourished.  HENT:  Right Ear: Hearing, tympanic membrane, external ear and ear canal normal.  Left Ear: Hearing, tympanic membrane and ear canal normal.  Ears:  Nose: Right sinus exhibits no maxillary sinus tenderness and no frontal sinus tenderness. Left sinus exhibits maxillary sinus tenderness and frontal sinus tenderness.  Mouth/Throat: Posterior oropharyngeal erythema present. No posterior oropharyngeal edema.  + PND + errythema  Eyes: Conjunctivae are normal. Right eye exhibits no discharge. Left eye exhibits no discharge.  Cardiovascular: Normal rate, regular rhythm and normal heart sounds.  No murmur heard. Pulmonary/Chest: Effort normal and breath sounds normal. No respiratory distress. She has no wheezes. She has no rales. She exhibits no tenderness.  Abdominal: Soft. Bowel sounds are normal. She exhibits no distension and no mass. There is tenderness. There is  guarding. There is no rebound.    Musculoskeletal: She exhibits no edema.  Lymphadenopathy:    She has cervical adenopathy.  Neurological: She is alert and oriented to person, place, and time.  Psychiatric: She has a normal mood and affect. Her behavior is normal. Thought content normal.  Nursing note and vitals reviewed.  BP 140/88 mmHg  Pulse 67  Temp(Src) 98.2 F (36.8 C) (Oral)  Wt 184 lb (83.462 kg)  SpO2 98% Wt Readings from Last 3 Encounters:  08/13/15 184 lb (83.462 kg)  07/29/15 182 lb (82.555 kg)  04/23/15 181 lb (82.101 kg)     Lab Results  Component Value Date   WBC 5.3 09/28/2014   HGB 13.6 09/28/2014   HCT 40.7 09/28/2014   PLT 217 09/28/2014   GLUCOSE 81 09/28/2014   CHOL 190 12/19/2014   TRIG 176* 12/19/2014   HDL 39* 12/19/2014   LDLCALC 116* 12/19/2014   ALT 19 09/28/2014   AST 17 09/28/2014   NA 142 09/28/2014   K 4.0 09/28/2014   CL 105 04/24/2014   CREATININE 1.0 09/28/2014   BUN 15.1 09/28/2014   CO2 27 09/28/2014   TSH 1.405 12/19/2014    US Venous Img Lower Unilateral Left  04/11/2015  CLINICAL DATA:  Acute onset of left popliteal pain for 10 days. Initial encounter. EXAM: LEFT LOWER EXTREMITY VENOUS DOPPLER ULTRASOUND TECHNIQUE: Gray-scale sonography with graded compression, as well as color Doppler and duplex ultrasound were performed to evaluate the lower extremity deep venous systems from the level of the common femoral vein and including the common femoral, femoral, profunda femoral, popliteal and calf veins including the posterior tibial, peroneal and gastrocnemius veins when visible. The superficial great saphenous vein was also interrogated. Spectral Doppler was utilized to evaluate flow at rest and with distal augmentation maneuvers in the common femoral, femoral and popliteal veins. COMPARISON:  Left knee radiographs performed earlier today at 6:55 p.m. FINDINGS: Contralateral Common Femoral Vein: Respiratory phasicity is normal and  symmetric with the symptomatic side. No evidence of thrombus. Normal compressibility. Common Femoral Vein: No evidence of thrombus. Normal compressibility, respiratory phasicity and response to augmentation. Saphenofemoral Junction: No evidence of thrombus. Normal compressibility and flow on color Doppler imaging. Profunda Femoral Vein: No evidence of thrombus. Normal compressibility and flow on color Doppler imaging. Femoral Vein: No evidence of thrombus. Normal compressibility, respiratory phasicity and response to augmentation. Popliteal Vein: No evidence of thrombus. Normal compressibility, respiratory phasicity and response to augmentation. Calf Veins: No evidence of thrombus. Normal compressibility and flow on color Doppler imaging. Superficial Great Saphenous Vein: No evidence of thrombus. Normal compressibility and flow on color Doppler imaging. Venous Reflux:  None. Other Findings:  A moderate to large knee joint effusion is noted. IMPRESSION: 1. No evidence of deep venous thrombosis. 2. Moderate to large knee joint effusion noted. Electronically Signed   By: Garald Balding M.D.   On: 04/11/2015 19:45   Dg Knee Complete 4 Views Left  04/11/2015  CLINICAL DATA:  Left knee pain for 10 days.  No known injury. EXAM: LEFT KNEE - COMPLETE 4+ VIEW COMPARISON:  None. FINDINGS: There is no evidence of fracture or dislocation. There is mild medial femorotibial compartment joint space narrowing. There is  a moderate joint effusion. Soft tissues are unremarkable. IMPRESSION: 1. Moderate left knee joint effusion. 2. No acute osseous injury of the left knee. 3. Mild medial femorotibial compartment osteoarthritis. Electronically Signed   By: Kathreen Devoid   On: 04/11/2015 19:09     Assessment & Plan:  Plan I am having Ms. Petit start on acetic acid-hydrocortisone, amoxicillin-clavulanate, fluconazole, and fluticasone. I am also having her maintain her tamoxifen, valACYclovir, dexlansoprazole, and ofloxacin.  Meds  ordered this encounter  Medications  . acetic acid-hydrocortisone (VOSOL-HC) otic solution    Sig: Place 4 drops into the left ear 3 (three) times daily.    Dispense:  10 mL    Refill:  0  . amoxicillin-clavulanate (AUGMENTIN) 875-125 MG tablet    Sig: Take 1 tablet by mouth 2 (two) times daily.    Dispense:  20 tablet    Refill:  0  . fluconazole (DIFLUCAN) 150 MG tablet    Sig: 1 po qd x1, may repeat in 3 days prn    Dispense:  2 tablet    Refill:  0  . fluticasone (FLONASE) 50 MCG/ACT nasal spray    Sig: Place 2 sprays into both nostrils daily.    Dispense:  16 g    Refill:  6    Problem List Items Addressed This Visit    None    Visit Diagnoses    Abdominal pain, left lower quadrant    -  Primary    Relevant Orders    CT Abdomen Pelvis W Contrast (Completed)    CBC with Differential/Platelet    POCT urinalysis dipstick    POCT urine pregnancy    TSH    Comp Met (CMET)    Diverticulitis of colon        Relevant Medications    amoxicillin-clavulanate (AUGMENTIN) 875-125 MG tablet    Diarrhea, unspecified type        Relevant Orders    CT Abdomen Pelvis W Contrast (Completed)    CBC with Differential/Platelet    POCT urinalysis dipstick    POCT urine pregnancy    TSH    Comp Met (CMET)    Eczema of external ear, left        Relevant Medications    acetic acid-hydrocortisone (VOSOL-HC) otic solution    Acute frontal sinusitis, recurrence not specified        Relevant Medications    amoxicillin-clavulanate (AUGMENTIN) 875-125 MG tablet    fluconazole (DIFLUCAN) 150 MG tablet    fluticasone (FLONASE) 50 MCG/ACT nasal spray    Vaginitis and vulvovaginitis        Relevant Medications    fluconazole (DIFLUCAN) 150 MG tablet       Follow-up: Return if symptoms worsen or fail to improve.  Garnet Koyanagi, DO

## 2015-08-13 NOTE — Patient Instructions (Addendum)
Get mucinex or delsym for cough during they day     Diarrhea Diarrhea is frequent loose and watery bowel movements. It can cause you to feel weak and dehydrated. Dehydration can cause you to become tired and thirsty, have a dry mouth, and have decreased urination that often is dark yellow. Diarrhea is a sign of another problem, most often an infection that will not last long. In most cases, diarrhea typically lasts 2-3 days. However, it can last longer if it is a sign of something more serious. It is important to treat your diarrhea as directed by your caregiver to lessen or prevent future episodes of diarrhea. CAUSES  Some common causes include:  Gastrointestinal infections caused by viruses, bacteria, or parasites.  Food poisoning or food allergies.  Certain medicines, such as antibiotics, chemotherapy, and laxatives.  Artificial sweeteners and fructose.  Digestive disorders. HOME CARE INSTRUCTIONS  Ensure adequate fluid intake (hydration): Have 1 cup (8 oz) of fluid for each diarrhea episode. Avoid fluids that contain simple sugars or sports drinks, fruit juices, whole milk products, and sodas. Your urine should be clear or pale yellow if you are drinking enough fluids. Hydrate with an oral rehydration solution that you can purchase at pharmacies, retail stores, and online. You can prepare an oral rehydration solution at home by mixing the following ingredients together:   - tsp table salt.   tsp baking soda.   tsp salt substitute containing potassium chloride.  1  tablespoons sugar.  1 L (34 oz) of water.  Certain foods and beverages may increase the speed at which food moves through the gastrointestinal (GI) tract. These foods and beverages should be avoided and include:  Caffeinated and alcoholic beverages.  High-fiber foods, such as raw fruits and vegetables, nuts, seeds, and whole grain breads and cereals.  Foods and beverages sweetened with sugar alcohols, such as  xylitol, sorbitol, and mannitol.  Some foods may be well tolerated and may help thicken stool including:  Starchy foods, such as rice, toast, pasta, low-sugar cereal, oatmeal, grits, baked potatoes, crackers, and bagels.  Bananas.  Applesauce.  Add probiotic-rich foods to help increase healthy bacteria in the GI tract, such as yogurt and fermented milk products.  Wash your hands well after each diarrhea episode.  Only take over-the-counter or prescription medicines as directed by your caregiver.  Take a warm bath to relieve any burning or pain from frequent diarrhea episodes. SEEK IMMEDIATE MEDICAL CARE IF:   You are unable to keep fluids down.  You have persistent vomiting.  You have blood in your stool, or your stools are black and tarry.  You do not urinate in 6-8 hours, or there is only a small amount of very dark urine.  You have abdominal pain that increases or localizes.  You have weakness, dizziness, confusion, or light-headedness.  You have a severe headache.  Your diarrhea gets worse or does not get better.  You have a fever or persistent symptoms for more than 2-3 days.  You have a fever and your symptoms suddenly get worse. MAKE SURE YOU:   Understand these instructions.  Will watch your condition.  Will get help right away if you are not doing well or get worse.   This information is not intended to replace advice given to you by your health care provider. Make sure you discuss any questions you have with your health care provider.   Document Released: 10/02/2002 Document Revised: 11/02/2014 Document Reviewed: 06/19/2012 Elsevier Interactive Patient Education 2016 Else[employer]  Inc[employer]  

## 2015-08-13 NOTE — Progress Notes (Signed)
Pre visit review using our clinic review tool, if applicable. No additional management support is needed unless otherwise documented below in the visit note. 

## 2015-08-15 ENCOUNTER — Telehealth: Payer: Self-pay | Admitting: Family Medicine

## 2015-08-15 DIAGNOSIS — R109 Unspecified abdominal pain: Secondary | ICD-10-CM

## 2015-08-15 DIAGNOSIS — R197 Diarrhea, unspecified: Secondary | ICD-10-CM

## 2015-08-15 MED ORDER — HYOSCYAMINE SULFATE 0.125 MG PO TABS: 0.1250 mg | ORAL_TABLET | ORAL | Status: DC | PRN

## 2015-08-15 NOTE — Telephone Encounter (Signed)
Caller name: Tena Niese   Relationship to patient: Self   Can be reached: [phone]   Reason for call:

## 2015-08-15 NOTE — Addendum Note (Signed)
Addended by: Ewing Schlein on: 08/15/2015 03:29 PM   Modules accepted: Orders

## 2015-08-15 NOTE — Telephone Encounter (Signed)
..  sorry system shut down on previous message..    Pt called in because she is requesting her CT results.   Please advise.    Thanks.

## 2015-08-15 NOTE — Telephone Encounter (Signed)
Stool culture, c diff levsin sl  1 sl q4h prn Refer to GI

## 2015-08-15 NOTE — Telephone Encounter (Signed)
Notes Recorded by Rosalita Chessman, DO on 08/13/2015 at 4:12 PM No diverticulitis--- or any other abn finding--- Make sure you are drinking plenty of fluids and call us if you do not start feeling better   Spoke with the patient and she stated the Diarrhea has gotten worst. She is eating a regular meals, no blood,no fever,  and she is still having the abdominal pain and feeling the bubbles in the stomach.    KP

## 2015-08-15 NOTE — Telephone Encounter (Addendum)
Patient has been made aware and verbalized understanding. She will pick up the stool containers today after work and Levsin has been faxed. Stool containers left at check in.   KP

## 2015-08-16 ENCOUNTER — Other Ambulatory Visit (INDEPENDENT_AMBULATORY_CARE_PROVIDER_SITE_OTHER): Payer: 59

## 2015-08-16 DIAGNOSIS — R197 Diarrhea, unspecified: Secondary | ICD-10-CM | POA: Diagnosis not present

## 2015-08-16 DIAGNOSIS — R109 Unspecified abdominal pain: Secondary | ICD-10-CM

## 2015-08-16 LAB — POCT URINALYSIS DIPSTICK
BILIRUBIN UA: NEGATIVE
Blood, UA: NEGATIVE
Glucose, UA: NEGATIVE
Ketones, UA: NEGATIVE
Leukocytes, UA: NEGATIVE
NITRITE UA: NEGATIVE
PH UA: 6
Protein, UA: NEGATIVE
Spec Grav, UA: 1.015
Urobilinogen, UA: 0.2

## 2015-08-16 LAB — CBC WITH DIFFERENTIAL/PLATELET
Basophils Absolute: 0 10*3/uL (ref 0.0–0.1)
Basophils Relative: 0.6 % (ref 0.0–3.0)
EOS PCT: 1.8 % (ref 0.0–5.0)
Eosinophils Absolute: 0.1 10*3/uL (ref 0.0–0.7)
HCT: 42.1 % (ref 36.0–46.0)
Hemoglobin: 13.7 g/dL (ref 12.0–15.0)
LYMPHS PCT: 30 % (ref 12.0–46.0)
Lymphs Abs: 1.3 10*3/uL (ref 0.7–4.0)
MCHC: 32.5 g/dL (ref 30.0–36.0)
MCV: 87.3 fl (ref 78.0–100.0)
MONOS PCT: 7.2 % (ref 3.0–12.0)
Monocytes Absolute: 0.3 10*3/uL (ref 0.1–1.0)
NEUTROS PCT: 60.4 % (ref 43.0–77.0)
Neutro Abs: 2.6 10*3/uL (ref 1.4–7.7)
Platelets: 210 10*3/uL (ref 150.0–400.0)
RBC: 4.82 Mil/uL (ref 3.87–5.11)
RDW: 14.6 % (ref 11.5–15.5)
WBC: 4.4 10*3/uL (ref 4.0–10.5)

## 2015-08-16 LAB — TSH: TSH: 1.9 u[IU]/mL (ref 0.35–4.50)

## 2015-08-16 LAB — COMPREHENSIVE METABOLIC PANEL
ALK PHOS: 57 U/L (ref 39–117)
ALT: 18 U/L (ref 0–35)
AST: 20 U/L (ref 0–37)
Albumin: 3.9 g/dL (ref 3.5–5.2)
BUN: 11 mg/dL (ref 6–23)
CO2: 31 mEq/L (ref 19–32)
CREATININE: 1.05 mg/dL (ref 0.40–1.20)
Calcium: 9.3 mg/dL (ref 8.4–10.5)
Chloride: 108 mEq/L (ref 96–112)
GFR: 69.78 mL/min (ref >60.00)
GLUCOSE: 92 mg/dL (ref 70–99)
POTASSIUM: 4.3 meq/L (ref 3.5–5.1)
SODIUM: 147 meq/L — AB (ref 135–145)
Total Bilirubin: 0.3 mg/dL (ref 0.2–1.2)
Total Protein: 6.8 g/dL (ref 6.0–8.3)

## 2015-08-16 LAB — POCT URINE PREGNANCY: Preg Test, Ur: NEGATIVE

## 2015-08-16 NOTE — Addendum Note (Signed)
Addended by: Harl Bowie on: 08/16/2015 09:22 AM   Modules accepted: Orders

## 2015-08-17 ENCOUNTER — Emergency Department (HOSPITAL_COMMUNITY): Payer: 59

## 2015-08-17 ENCOUNTER — Encounter (HOSPITAL_COMMUNITY): Payer: Self-pay

## 2015-08-17 ENCOUNTER — Emergency Department (HOSPITAL_COMMUNITY)
Admission: EM | Admit: 2015-08-17 | Discharge: 2015-08-17 | Disposition: A | Discharge status: Home / Self Care | Payer: 59 | Attending: Emergency Medicine | Admitting: Emergency Medicine

## 2015-08-17 DIAGNOSIS — Z86010 Personal history of colonic polyps: Secondary | ICD-10-CM | POA: Insufficient documentation

## 2015-08-17 DIAGNOSIS — Z8659 Personal history of other mental and behavioral disorders: Secondary | ICD-10-CM | POA: Insufficient documentation

## 2015-08-17 DIAGNOSIS — Z8639 Personal history of other endocrine, nutritional and metabolic disease: Secondary | ICD-10-CM | POA: Insufficient documentation

## 2015-08-17 DIAGNOSIS — Z7951 Long term (current) use of inhaled steroids: Secondary | ICD-10-CM | POA: Diagnosis not present

## 2015-08-17 DIAGNOSIS — K219 Gastro-esophageal reflux disease without esophagitis: Secondary | ICD-10-CM | POA: Diagnosis not present

## 2015-08-17 DIAGNOSIS — Z91040 Latex allergy status: Secondary | ICD-10-CM | POA: Diagnosis not present

## 2015-08-17 DIAGNOSIS — Z7952 Long term (current) use of systemic steroids: Secondary | ICD-10-CM | POA: Insufficient documentation

## 2015-08-17 DIAGNOSIS — J209 Acute bronchitis, unspecified: Secondary | ICD-10-CM | POA: Insufficient documentation

## 2015-08-17 DIAGNOSIS — Z792 Long term (current) use of antibiotics: Secondary | ICD-10-CM | POA: Diagnosis not present

## 2015-08-17 DIAGNOSIS — Z923 Personal history of irradiation: Secondary | ICD-10-CM | POA: Insufficient documentation

## 2015-08-17 DIAGNOSIS — R05 Cough: Secondary | ICD-10-CM | POA: Diagnosis present

## 2015-08-17 DIAGNOSIS — Z87891 Personal history of nicotine dependence: Secondary | ICD-10-CM | POA: Diagnosis not present

## 2015-08-17 DIAGNOSIS — Z79899 Other long term (current) drug therapy: Secondary | ICD-10-CM | POA: Diagnosis not present

## 2015-08-17 DIAGNOSIS — Z8739 Personal history of other diseases of the musculoskeletal system and connective tissue: Secondary | ICD-10-CM | POA: Diagnosis not present

## 2015-08-17 LAB — BASIC METABOLIC PANEL
ANION GAP: 7 (ref 5–15)
BUN: 10 mg/dL (ref 6–20)
CALCIUM: 8.9 mg/dL (ref 8.9–10.3)
CO2: 24 mmol/L (ref 22–32)
CREATININE: 1.14 mg/dL — AB (ref 0.44–1.00)
Chloride: 109 mmol/L (ref 101–111)
GFR, EST NON AFRICAN AMERICAN: 53 mL/min — AB (ref >60)
Glucose, Bld: 105 mg/dL — ABNORMAL HIGH (ref 65–99)
Potassium: 3.7 mmol/L (ref 3.5–5.1)
SODIUM: 140 mmol/L (ref 135–145)

## 2015-08-17 LAB — CBC
HCT: 39.9 % (ref 36.0–46.0)
HEMOGLOBIN: 13.3 g/dL (ref 12.0–15.0)
MCH: 28.3 pg (ref 26.0–34.0)
MCHC: 33.3 g/dL (ref 30.0–36.0)
MCV: 84.9 fL (ref 78.0–100.0)
PLATELETS: 201 10*3/uL (ref 150–400)
RBC: 4.7 MIL/uL (ref 3.87–5.11)
RDW: 13.6 % (ref 11.5–15.5)
WBC: 4.9 10*3/uL (ref 4.0–10.5)

## 2015-08-17 LAB — C. DIFFICILE GDH AND TOXIN A/B
C. DIFF TOXIN A/B: NOT DETECTED
C. DIFFICILE GDH: NOT DETECTED

## 2015-08-17 LAB — I-STAT TROPONIN, ED: Troponin i, poc: 0 ng/mL (ref 0.00–0.08)

## 2015-08-17 MED ORDER — HYDROCOD POLST-CPM POLST ER 10-8 MG/5ML PO SUER: 5.0000 mL | Freq: Two times a day (BID) | ORAL | Status: DC | PRN

## 2015-08-17 MED ORDER — IPRATROPIUM-ALBUTEROL 0.5-2.5 (3) MG/3ML IN SOLN
3.0000 mL | Freq: Once | RESPIRATORY_TRACT | Status: AC
  Administered 2015-08-17: 3 mL via RESPIRATORY_TRACT
  Filled 2015-08-17: qty 3

## 2015-08-17 MED ORDER — HYDROCOD POLST-CPM POLST ER 10-8 MG/5ML PO SUER
5.0000 mL | Freq: Once | ORAL | Status: AC
  Administered 2015-08-17: 5 mL via ORAL
  Filled 2015-08-17: qty 5

## 2015-08-17 NOTE — Discharge Instructions (Signed)

## 2015-08-17 NOTE — ED Provider Notes (Signed)
CSN: [id number]     Arrival date & time 08/17/15  2055 History   First MD Initiated Contact with Patient 08/17/15 2109     Chief Complaint  Patient presents with  . Cough  . Chest Pain    HPI Pt started coughing about a week ago.  She saw her doctor and was prescribed amoxicillin a few days ago.  She has taken about a third of them but her cough has persisted.  No fevers.  No vomiting but she has had diarrhea.  Whenever she talks she starts to cough.  She has tried over the counter medications for the cough but they have not helped.  Pt cannot get the coughing to stop so she came to the ED tonight. Past Medical History  Diagnosis Date  . Alcoholism ([place])   . Arthritis   . Personal history of colonic polyps     TUBULAR ADENOMA  . Hyperlipemia   . Esophageal reflux   . ADD (attention deficit disorder with hyperactivity)   . Hiatal hernia   . Hx of radiation therapy 08/27/11 to 10/15/11    L breast   Past Surgical History  Procedure Laterality Date  . Laparoscopic hysterectomy  1995  . Abdominal hysterectomy    . Breast lumpectomy  07/23/11    left breast   Family History  Problem Relation Age of Onset  . Breast cancer Mother   . Cancer Mother     breast  . Colon cancer Neg Hx   . Diabetes Father   . Hypertension Father   . Cancer Brother     NEUROENDOCRINE  . Hypertension Brother   . Arthritis Brother     hips   Social History  Substance Use Topics  . Smoking status: Former Research scientist (life sciences)  . Smokeless tobacco: Never Used  . Alcohol Use: No   OB History    Gravida Para Term Preterm AB TAB SAB Ectopic Multiple Living   3 3        3      Review of Systems  All other systems reviewed and are negative.     Allergies  Meloxicam; Latex; and Miconazole nitrate  Home Medications   Prior to Admission medications   Medication Sig Start Date End Date Taking? Authorizing Provider  acetic acid-hydrocortisone (VOSOL-HC) otic solution Place 4 drops into the left ear 3 (three)  times daily. 08/13/15   Rosalita Chessman, DO  amoxicillin-clavulanate (AUGMENTIN) 875-125 MG tablet Take 1 tablet by mouth 2 (two) times daily. 08/13/15   Rosalita Chessman, DO  chlorpheniramine-HYDROcodone (TUSSIONEX) 10-8 MG/5ML SUER Take 5 mLs by mouth every 12 (twelve) hours as needed for cough. 08/17/15   Dorie Rank, MD  dexlansoprazole (DEXILANT) 60 MG capsule Take 1 capsule (60 mg total) by mouth daily. 04/19/15   Rosalita Chessman, DO  fluconazole (DIFLUCAN) 150 MG tablet 1 po qd x1, may repeat in 3 days prn 08/13/15   Alferd Apa Lowne, DO  fluticasone (FLONASE) 50 MCG/ACT nasal spray Place 2 sprays into both nostrils daily. 08/13/15   Rosalita Chessman, DO  hyoscyamine (LEVSIN) 0.125 MG tablet Take 1 tablet (0.125 mg total) by mouth every 4 (four) hours as needed. 08/15/15   Rosalita Chessman, DO  ofloxacin (FLOXIN) 0.3 % otic solution Place 10 drops into both ears daily. 07/29/15   Rosalita Chessman, DO  tamoxifen (NOLVADEX) 20 MG tablet Take 1 tablet (20 mg total) by mouth daily. 10/03/14   Chauncey Cruel, MD  valACYclovir (VALTREX) 500 MG tablet Take twice daily for 3-5 days as needed 02/19/15   Huel Cote, NP   BP 95/74 mmHg  Pulse 75  Temp(Src) 98.5 F (36.9 C) (Oral)  Resp 16  SpO2 97% Physical Exam  Constitutional: She appears well-developed and well-nourished. No distress.  HENT:  Head: Normocephalic and atraumatic.  Right Ear: External ear normal.  Left Ear: External ear normal.  Eyes: Conjunctivae are normal. Right eye exhibits no discharge. Left eye exhibits no discharge. No scleral icterus.  Neck: Neck supple. No tracheal deviation present.  Cardiovascular: Normal rate, regular rhythm and intact distal pulses.   Pulmonary/Chest: Effort normal and breath sounds normal. No stridor. No respiratory distress. She has no wheezes. She has no rales.  Frequently coughing  Abdominal: Soft. Bowel sounds are normal. She exhibits no distension. There is no tenderness. There is no rebound and no  guarding.  Musculoskeletal: She exhibits no edema or tenderness.  Neurological: She is alert. She has normal strength. No cranial nerve deficit (no facial droop, extraocular movements intact, no slurred speech) or sensory deficit. She exhibits normal muscle tone. She displays no seizure activity. Coordination normal.  Skin: Skin is warm and dry. No rash noted.  Psychiatric: She has a normal mood and affect.  Nursing note and vitals reviewed.   ED Course  Procedures (including critical care time) Labs Review Labs Reviewed  BASIC METABOLIC PANEL - Abnormal; Notable for the following:    Glucose, Bld 105 (*)    Creatinine, Ser 1.14 (*)    GFR calc non Af Amer 53 (*)    All other components within normal limits  CBC  I-STAT TROPOININ, ED    Imaging Review Dg Chest 2 View  08/17/2015  CLINICAL DATA:  Acute onset of generalized chest pain and shortness of breath. Cough. Initial encounter. EXAM: CHEST  2 VIEW COMPARISON:  Chest radiograph performed 04/14/2007 FINDINGS: The lungs are well-aerated and clear. There is no evidence of focal opacification, pleural effusion or pneumothorax. The heart is normal in size; the mediastinal contour is within normal limits. No acute osseous abnormalities are seen. Postoperative change is noted about the left axilla and left breast. IMPRESSION: No acute cardiopulmonary process seen. Electronically Signed   By: Garald Balding M.D.   On: 08/17/2015 22:20   I have personally reviewed and evaluated these images and lab results as part of my medical decision-making.   EKG Interpretation   Date/Time:  Saturday August 17 2015 21:05:57 EDT Ventricular Rate:  86 PR Interval:  126 QRS Duration: 76 QT Interval:  358 QTC Calculation: 428 R Axis:   127 Text Interpretation:  suspect limb lead reversal compared to prior ECG  probable sinus rhythm Abnormal ECG Confirmed by Dashiel Bergquist  MD-J, Jazzalyn Loewenstein ([id number])  on 08/17/2015 9:10:39 PM      MDM   Final diagnoses:   Acute bronchitis, unspecified organism   Most likely viral.  No pna on xray.  Labs unremarkable.  Coughing improved with medications.  Will dc home with tussionex rx.      Dorie Rank, MD 08/17/15 [phone]

## 2015-08-17 NOTE — ED Notes (Signed)
Pt reports dry cough, onset Monday and right sided CP that radiates down right arm. Pt seen by PCP twice this week and given abx but is not working and cough has not went away.

## 2015-08-20 LAB — STOOL CULTURE

## 2015-09-06 ENCOUNTER — Other Ambulatory Visit: Payer: Self-pay | Admitting: Physician Assistant

## 2015-09-06 DIAGNOSIS — Z853 Personal history of malignant neoplasm of breast: Secondary | ICD-10-CM

## 2015-09-24 ENCOUNTER — Other Ambulatory Visit: Payer: Self-pay | Admitting: Oncology

## 2015-09-24 DIAGNOSIS — Z853 Personal history of malignant neoplasm of breast: Secondary | ICD-10-CM

## 2015-10-04 ENCOUNTER — Ambulatory Visit
Admission: RE | Admit: 2015-10-04 | Discharge: 2015-10-04 | Disposition: A | Discharge status: Home / Self Care | Payer: 59 | Source: Ambulatory Visit | Attending: Oncology | Admitting: Oncology

## 2015-10-04 DIAGNOSIS — Z853 Personal history of malignant neoplasm of breast: Secondary | ICD-10-CM

## 2015-10-16 ENCOUNTER — Other Ambulatory Visit: Payer: Self-pay

## 2015-10-16 DIAGNOSIS — C50512 Malignant neoplasm of lower-outer quadrant of left female breast: Secondary | ICD-10-CM

## 2015-10-16 DIAGNOSIS — C50919 Malignant neoplasm of unspecified site of unspecified female breast: Secondary | ICD-10-CM

## 2015-10-17 ENCOUNTER — Other Ambulatory Visit: Payer: 59

## 2015-10-17 ENCOUNTER — Ambulatory Visit: Payer: 59 | Admitting: Oncology

## 2015-10-22 ENCOUNTER — Telehealth: Payer: Self-pay | Admitting: Oncology

## 2015-10-22 NOTE — Telephone Encounter (Signed)
Patient called to confirmed next appointment. Patient missed 12/22 lab/GM and was r/s to 1/18. Patient has new date/time.

## 2015-11-13 ENCOUNTER — Ambulatory Visit (HOSPITAL_BASED_OUTPATIENT_CLINIC_OR_DEPARTMENT_OTHER): Payer: 59 | Admitting: Oncology

## 2015-11-13 ENCOUNTER — Telehealth: Payer: Self-pay | Admitting: Oncology

## 2015-11-13 ENCOUNTER — Other Ambulatory Visit (HOSPITAL_BASED_OUTPATIENT_CLINIC_OR_DEPARTMENT_OTHER): Payer: 59

## 2015-11-13 VITALS — BP 134/77 | HR 75 | Temp 97.8°F | Resp 18 | Ht 63.0 in | Wt 181.7 lb

## 2015-11-13 DIAGNOSIS — E669 Obesity, unspecified: Secondary | ICD-10-CM

## 2015-11-13 DIAGNOSIS — C50912 Malignant neoplasm of unspecified site of left female breast: Secondary | ICD-10-CM

## 2015-11-13 DIAGNOSIS — N951 Menopausal and female climacteric states: Secondary | ICD-10-CM

## 2015-11-13 DIAGNOSIS — C50512 Malignant neoplasm of lower-outer quadrant of left female breast: Secondary | ICD-10-CM

## 2015-11-13 DIAGNOSIS — C50412 Malignant neoplasm of upper-outer quadrant of left female breast: Secondary | ICD-10-CM

## 2015-11-13 DIAGNOSIS — Z170 Estrogen receptor positive status [ER+]: Secondary | ICD-10-CM

## 2015-11-13 DIAGNOSIS — C50919 Malignant neoplasm of unspecified site of unspecified female breast: Secondary | ICD-10-CM

## 2015-11-13 LAB — CBC WITH DIFFERENTIAL/PLATELET
BASO%: 0.4 % (ref 0.0–2.0)
Basophils Absolute: 0 10*3/uL (ref 0.0–0.1)
EOS%: 1.7 % (ref 0.0–7.0)
Eosinophils Absolute: 0.1 10*3/uL (ref 0.0–0.5)
HEMATOCRIT: 41.1 % (ref 34.8–46.6)
HGB: 13.4 g/dL (ref 11.6–15.9)
LYMPH#: 2 10*3/uL (ref 0.9–3.3)
LYMPH%: 35.2 % (ref 14.0–49.7)
MCH: 28 pg (ref 25.1–34.0)
MCHC: 32.6 g/dL (ref 31.5–36.0)
MCV: 85.7 fL (ref 79.5–101.0)
MONO#: 0.5 10*3/uL (ref 0.1–0.9)
MONO%: 8.5 % (ref 0.0–14.0)
NEUT#: 3 10*3/uL (ref 1.5–6.5)
NEUT%: 54.2 % (ref 38.4–76.8)
Platelets: 226 10*3/uL (ref 145–400)
RBC: 4.8 10*6/uL (ref 3.70–5.45)
RDW: 14.3 % (ref 11.2–14.5)
WBC: 5.5 10*3/uL (ref 3.9–10.3)

## 2015-11-13 LAB — COMPREHENSIVE METABOLIC PANEL
ALT: 20 U/L (ref 0–55)
AST: 19 U/L (ref 5–34)
Albumin: 3.7 g/dL (ref 3.5–5.0)
Alkaline Phosphatase: 56 U/L (ref 40–150)
Anion Gap: 7 mEq/L (ref 3–11)
BUN: 15.3 mg/dL (ref 7.0–26.0)
CHLORIDE: 107 meq/L (ref 98–109)
CO2: 26 meq/L (ref 22–29)
CREATININE: 1 mg/dL (ref 0.6–1.1)
Calcium: 9.2 mg/dL (ref 8.4–10.4)
EGFR: 73 mL/min/{1.73_m2} — ABNORMAL LOW (ref >90)
Glucose: 95 mg/dl (ref 70–140)
Potassium: 4.4 mEq/L (ref 3.5–5.1)
Sodium: 141 mEq/L (ref 136–145)
Total Bilirubin: 0.41 mg/dL (ref 0.20–1.20)
Total Protein: 6.9 g/dL (ref 6.4–8.3)

## 2015-11-13 MED ORDER — GABAPENTIN 300 MG PO CAPS: 300.0000 mg | ORAL_CAPSULE | Freq: Every day | ORAL | Status: DC

## 2015-11-13 MED ORDER — TAMOXIFEN CITRATE 20 MG PO TABS: 20.0000 mg | ORAL_TABLET | Freq: Every day | ORAL | Status: DC

## 2015-11-13 NOTE — Telephone Encounter (Signed)
Appointment made and avs given.

## 2015-11-13 NOTE — Progress Notes (Signed)
ID: April Robinson   DOB: Oct 01, 1961  MR#: [id number]  XTK#:[id number]  PCP: Garnet Koyanagi, DO GYN: Uvaldo Rising, MD SU: Stark Klein, MD OTHER MD: Kyung Rudd, MD;  Verl Blalock, MD  CHIEF COMPLAINT:  Hx of Left Breast Cancer  CURRENT TREATMENT: tamoxifen   BREAST CANCER HISTORY: From the original intake note:  The patient had a questionable palpable finding in the left breast which was evaluated in July 2009 and it was recommended that she return for further evaluation but actually did not show.  She had bilateral diagnostic mammography in March 2011 which showed minimal duct ectasia of the left breast at the location in question.  Again, 37-monthinterval mammography was recommended and this was repeated August 2012.  There was minimally a more prominent ductal ectasia and MRI was recommended for further evaluation.  This was performed on June 22, 2011, and it showed a 1.2 cm enhancing mass in the posterior aspect of an area of irregular linear enhancement.  Anteriorly to this, there was a 6 mm area which is the area of dilated duct noted by prior evaluation.   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 The tumor was estrogen receptor positive at 72%, progesterone receptor positive at 5%, with a very low MIB1 at 4%.  HER2 was not amplified with a ratio by CISH of 1.10.   The patient's subsequent history is as detailed below.  INTERVAL HISTORY:  April Robinson today for follow-up of her estrogen receptor positive breast cancer. She continues on tamoxifen, which she tolerates well. The main issue is hot flashes. They do wake her up at night. They also occur during the day. She does not have a vaginal wetness problem from this drug. She obtains it at a good price.  REVIEW OF SYSTEMS:  April Robinson a little  stressed because her daughter has been living in her home now for 3 years, with 2 children, the younger being 390years old. She is hoping her daughter will be able to get a second job and eventually become more independent. April Robinson a "dreadful month" about October 2016, with severe coughing, fevers, flu, and multiple other symptoms, which eventually landed her in the emergency room. She was treated with antibiotics, and eventually it all cleared up. Currently her only complaint aside from hot flashes is a little bit of discomfort around her knees. She exercises by doing line dancing. A detailed review of systems today was otherwise stable   PAST MEDICAL HISTORY: Past Medical History  Diagnosis Date  . Alcoholism ([place]   . Arthritis   . Personal history of colonic polyps     TUBULAR ADENOMA  . Hyperlipemia   . Esophageal reflux   . ADD (attention deficit disorder with hyperactivity)   . Hiatal hernia   . Hx of radiation therapy 08/27/11 to 10/15/11    L breast    PAST SURGICAL HISTORY: Past Surgical History  Procedure Laterality Date  . Laparoscopic hysterectomy  1995  . Abdominal hysterectomy    . Breast lumpectomy  07/23/11    left breast  no BSO, still has both ovaries  FAMILY HISTORY Family History  Problem Relation Age of Onset  . Breast cancer Mother   . Cancer Mother     breast  . Colon cancer Neg Hx   . Diabetes  Father   . Hypertension Father   . Cancer Brother     NEUROENDOCRINE  . Hypertension Brother   . Arthritis Brother     hips  The patient's father is alive at age 2.  The patient's mother is alive at age 63.  She was diagnosed with breast cancer at age 68.  The patient had 2 brothers; one of them died from a neuroendocrine tumor.  She has 1 sister.  There is no other breast or ovarian cancer in the immediate family.  GYNECOLOGIC HISTORY:  (Reviewed 02/19/2014) She had menarche, age 84. She is GX P3 with first live birth at age 69. She had a hysterectomy in  1993, but did not have her ovaries removed.  She took hormone replacement for at least 2 years.    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  Her husband, Harmonii Karle, works as a Administrator.  The patient's children from a prior marriage are:  [name], 34, who lives in [place] and works as a Production assistant, radio.  [place] who works as a Pharmacist, hospital of ages between 47 and 12; she is also a Presenter, broadcasting.  Colina [place], 29, who studies accounting at [employer]. The patient has 8 grandchildren. She is very active in her local church    ADVANCED DIRECTIVES: Not in place  HEALTH MAINTENANCE:  (Updated 04/27/215) Social History  Substance Use Topics  . Smoking status: Former Research scientist (life sciences)  . Smokeless tobacco: Never Used  . Alcohol Use: No     Colonoscopy: March 2012, Dr. Sharlett Iles  PAP:  [facility], Dr. Toney Rakes  Bone density:  09/18/2013, normal  Lipid panel:  [facility], Dr. Etter Sjogren   Allergies  Allergen Reactions  . Meloxicam Shortness Of Breath and Nausea Only  . Latex   . Miconazole Nitrate Other (See Comments)    SWELLING OF SOFT PALATE THAT CAUSED DIFFICULTY SWALLOWING    Current Outpatient Prescriptions  Medication Sig Dispense Refill  . acetic acid-hydrocortisone (VOSOL-HC) otic solution Place 4 drops into the left ear 3 (three) times daily. 10 mL 0  . amoxicillin-clavulanate (AUGMENTIN) 875-125 MG tablet Take 1 tablet by mouth 2 (two) times daily. 20 tablet 0  . chlorpheniramine-HYDROcodone (TUSSIONEX) 10-8 MG/5ML SUER Take 5 mLs by mouth every 12 (twelve) hours as needed for cough. 140 mL 0  . dexlansoprazole (DEXILANT) 60 MG capsule Take 1 capsule (60 mg total) by mouth daily. 30 capsule 11  . fluconazole (DIFLUCAN) 150 MG tablet 1 po qd x1, may repeat in 3 days prn 2 tablet 0  . fluticasone (FLONASE) 50 MCG/ACT nasal spray Place 2 sprays into both  nostrils daily. 16 g 6  . hyoscyamine (LEVSIN) 0.125 MG tablet Take 1 tablet (0.125 mg total) by mouth every 4 (four) hours as needed. 30 tablet 0  . ofloxacin (FLOXIN) 0.3 % otic solution Place 10 drops into both ears daily. 5 mL 0  . tamoxifen (NOLVADEX) 20 MG tablet Take 1 tablet (20 mg total) by mouth daily. 90 tablet 3  . valACYclovir (VALTREX) 500 MG tablet Take twice daily for 3-5 days as needed 30 tablet 12   No current facility-administered medications for this visit.    OBJECTIVE: Middle-aged [place] American woman in no acute distress Filed Vitals:   11/13/15 1143  BP: 134/77  Pulse: 75  Temp: 97.8 F (36.6 C)  Resp: 18  Body mass index is 32.19 kg/(m^2).    ECOG FS: 0 Filed Weights   11/13/15 1143  Weight: 181 lb 11.2 oz (82.419 kg)   Sclerae unicteric, EOMs intact Oropharynx clear, dentition in good repair No cervical or supraclavicular adenopathy Lungs no rales or rhonchi Heart regular rate and rhythm Abd soft, nontender, positive bowel sounds MSK no focal spinal tenderness, no upper extremity lymphedema Neuro: nonfocal, well oriented, appropriate affect Breasts: The right breast is unremarkable per the left breast is status post lumpectomy and radiation. There is no evidence of local recurrence. Left axilla is benign.   LAB RESULTS: Lab Results  Component Value Date   WBC 5.5 11/13/2015   NEUTROABS 3.0 11/13/2015   HGB 13.4 11/13/2015   HCT 41.1 11/13/2015   MCV 85.7 11/13/2015   PLT 226 11/13/2015      Chemistry      Component Value Date/Time   NA 140 08/17/2015 2130   NA 142 09/28/2014 1321   K 3.7 08/17/2015 2130   K 4.0 09/28/2014 1321   CL 109 08/17/2015 2130   CL 107 01/30/2013 1509   CO2 24 08/17/2015 2130   CO2 27 09/28/2014 1321   BUN 10 08/17/2015 2130   BUN 15.1 09/28/2014 1321   CREATININE 1.14* 08/17/2015 2130   CREATININE 1.0 09/28/2014 1321   CREATININE 1.00 01/05/2014 1624      Component Value Date/Time   CALCIUM 8.9  08/17/2015 2130   CALCIUM 9.4 09/28/2014 1321   ALKPHOS 57 08/16/2015 0815   ALKPHOS 68 09/28/2014 1321   AST 20 08/16/2015 0815   AST 17 09/28/2014 1321   ALT 18 08/16/2015 0815   ALT 19 09/28/2014 1321   BILITOT 0.3 08/16/2015 0815   BILITOT 0.32 09/28/2014 1321        STUDIES: CLINICAL DATA: History of left breast cancer status post lumpectomy 2012.  EXAM: DIGITAL DIAGNOSTIC BILATERAL MAMMOGRAM WITH 3D TOMOSYNTHESIS AND CAD  COMPARISON: Previous exam(s).  ACR Breast Density Category b: There are scattered areas of fibroglandular density.  FINDINGS: Stable lumpectomy changes are seen of the left breast. There is no suspicious mass or malignant type microcalcifications.  Mammographic images were processed with CAD.  IMPRESSION: No evidence of malignancy in either breast.  RECOMMENDATION: Bilateral diagnostic mammogram in 1 year is recommended.  I have discussed the findings and recommendations with the patient. Results were also provided in writing at the conclusion of the visit. If applicable, a reminder letter will be sent to the patient regarding the next appointment.  BI-RADS CATEGORY 2: Benign.   Electronically Signed  By: Baird Lyons M.D.  On: 10/04/2015 13:33   ASSESSMENT: 55 y.o.  [place] woman   (1)  status post left lumpectomy and sentinel lymph node biopsy September of 2012 for a T1c N0, Stage IA  invasive ductal carcinoma, grade 2, estrogen and progesterone receptor positive with a very low MIB-1 and no HER-2 amplification.   (2)  She completed adjuvant radiation 10/15/2011   (3) started tamoxifen 10/27/2011, the plan being to continue for total of 10 years until 2023.  (4) s/p remote TAH, with ovaries still intact    PLAN:  April Robinson is now a little over 4 years out from her definitive surgery with no evidence of recurrent breast cancer. This is very favorable.  She is tolerating tamoxifen well, with the exception of the  hot flashes. The plan is to continue tamoxifen for total of 10 years.  We discussed the hot flashes at length. I  think she would benefit from gabapentin. I went ahead and wrote her the prescription. If she becomes groggy in the morning she will let us know and we will drop the dose 200 mg at bedtime.  We also discussed venlafaxine. Her husband has been reluctant to have her take any antidepressants because they have a friend who committed suicide after starting antidepressant medications. That person however was depressed and that is the reason she was placed on the medication. In April Robinson case we would be using one fourth of the dose, since the goal would be to control the daytime hot flashes.  She will think about it and let us know if she wants [place] to call that prescription in.  Otherwise she will see [place] again in November, after her October mammogram. She knows to call for any problems that may develop before that visit.Marland Kitchen   Chauncey Cruel, MD     11/13/2015

## 2015-12-26 ENCOUNTER — Encounter: Payer: Self-pay | Admitting: Internal Medicine

## 2016-01-04 ENCOUNTER — Encounter: Payer: Self-pay | Admitting: Family Medicine

## 2016-01-04 ENCOUNTER — Ambulatory Visit (INDEPENDENT_AMBULATORY_CARE_PROVIDER_SITE_OTHER): Payer: 59 | Admitting: Family Medicine

## 2016-01-04 VITALS — BP 122/80 | HR 74 | Temp 97.9°F | Wt 181.0 lb

## 2016-01-04 DIAGNOSIS — M542 Cervicalgia: Secondary | ICD-10-CM | POA: Diagnosis not present

## 2016-01-04 DIAGNOSIS — M6248 Contracture of muscle, other site: Secondary | ICD-10-CM

## 2016-01-04 DIAGNOSIS — G44209 Tension-type headache, unspecified, not intractable: Secondary | ICD-10-CM

## 2016-01-04 DIAGNOSIS — M62838 Other muscle spasm: Secondary | ICD-10-CM

## 2016-01-04 MED ORDER — DIAZEPAM 5 MG PO TABS: 5.0000 mg | ORAL_TABLET | Freq: Two times a day (BID) | ORAL | Status: DC | PRN

## 2016-01-04 NOTE — Progress Notes (Signed)
[facility] [place], [place] Phone: [phone]  Patient ID: April Robinson MRN: [id number], DOB: April 22, 1961, 55 y.o. Date of Encounter: 01/04/2016  Primary Physician:  Garnet Koyanagi, DO   Chief Complaint:  Chief Complaint  Patient presents with  . migraine   Subjective:   History of Present Illness:  April Robinson is a 55 y.o. pleasant patient who presents with the following:  "Migraine" x 3 days: on further questioning, the patient does not have a history of migraine headaches, she has never had any aura, nauseousness, photophobia, or phonophobia associated with headache.  Primary issue now is neck pain and trapezius muscular spasm as well as parascapular pain that then advances and becomes headache.  This is been ongoing for 3 days intermittently.  Started on Wednesday. 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 Neck and shoulder to the top of her head.  Old injury per report and pain in the trap region. She has done some rehabilitation for this in the past.  Never had an issue with light or sound with headaches.  Also got really dizzy and fell on him - to a colleague, which has now completely subsided  The PMH, PSH, Social History, Family History, Medications, and allergies have been reviewed in [facility], and have been updated if relevant.  Patient Active Problem List   Diagnosis Date Noted  . Left knee pain 04/23/2015  . Knee pain, left 04/11/2015  . Popliteal pain 04/11/2015  . Hypertension 10/17/2014  . Acute pharyngitis 09/04/2014  . Left shoulder pain 02/19/2014  . Benign paroxysmal positional vertigo 09/29/2013  . Hot flashes due to tamoxifen 08/16/2013  . Hx of vaginitis 04/15/2012  . De Quervain's disease (tenosynovitis) 04/05/2012  . Breast cancer of upper-outer quadrant of left female breast ([place])   . IBS 12/03/2010  . ELEVATED BP READING WITHOUT DX HYPERTENSION  12/03/2010  . NECK SPRAIN AND STRAIN 12/03/2010  . ANXIETY STATE, UNSPECIFIED 11/07/2010  . ADD 11/07/2010  . GERD 11/07/2010  . CHEST PAIN UNSPECIFIED 11/07/2010    Past Medical History  Diagnosis Date  . Alcoholism ([place])   . Arthritis   . Personal history of colonic polyps     TUBULAR ADENOMA  . Hyperlipemia   . Esophageal reflux   . ADD (attention deficit disorder with hyperactivity)   . Hiatal hernia   . Hx of radiation therapy 08/27/11 to 10/15/11    L breast    Past Surgical History  Procedure Laterality Date  . Laparoscopic hysterectomy  1995  . Abdominal hysterectomy    . Breast lumpectomy  07/23/11    left breast    Social History   Social History  . Marital Status: Married    Spouse Name: Nathaneil Canary  . Number of Children: 3  . Years of Education: 12+   Occupational History  . Customer Service   .     Social History Main Topics  . Smoking status: Former Research scientist (life sciences)  . Smokeless tobacco: Never Used  . Alcohol Use: No  . Drug Use: No  . Sexual Activity:    Partners: Male    Birth Control/ Protection: Surgical     Comment: [name]  , SEXUAL PARTNERS MORE THAN 5   Other Topics Concern  . Not on file   Social History Narrative   Lives with her husband. Their children (her 3 and his one) live locally.  Family History  Problem Relation Age of Onset  . Breast cancer Mother   . Cancer Mother     breast  . Colon cancer Neg Hx   . Diabetes Father   . Hypertension Father   . Cancer Brother     NEUROENDOCRINE  . Hypertension Brother   . Arthritis Brother     hips    Allergies  Allergen Reactions  . Meloxicam Shortness Of Breath and Nausea Only  . Latex   . Miconazole Nitrate Other (See Comments)    SWELLING OF SOFT PALATE THAT CAUSED DIFFICULTY SWALLOWING    Medication list reviewed and updated in full in [facility].   Review of Systems:  GEN: No fevers, chills. Nontoxic. Primarily MSK c/o today. MSK: Detailed in the  HPI GI: tolerating PO intake without difficulty Neuro: No numbness, parasthesias, or tingling associated. Otherwise the pertinent positives of the ROS are noted above.    Objective:   Physical Examination: BP 122/80 mmHg  Pulse 74  Temp(Src) 97.9 F (36.6 C)  Wt 181 lb (82.101 kg)   GEN: Well-developed,well-nourished,in no acute distress; alert,appropriate and cooperative throughout examination HEENT: Normocephalic and atraumatic without obvious abnormalities. Ears, externally no deformities PULM: Breathing comfortably in no respiratory distress EXT: No clubbing, cyanosis, or edema PSYCH: Normally interactive. Cooperative during the interview. Pleasant. Friendly and conversant. Not anxious or depressed appearing. Normal, full affect.  CERVICAL SPINE EXAM Range of motion: Flexion, extension, lateral bending, and rotation: approaching full of an approximate 10% loss of motion in each direction.  Pain with terminal motion: yes Spinous Processes: NT SCM: NT Upper paracervical muscles: diffusely tender post Upper traps: notable tenderness in upper traps C5-T1 intact, sensation and motor   Forward sloping scapula while sitting and standing.  No obvious winging.  Tenderness in and throughout the entire trapezius region.  Laboratory and Imaging Data:  Assessment & Plan:   Cervicalgia  Trapezius muscle spasm  Tension headache  Acute headache seems to be derived from neck pain as well as muscular spasm without a history of migraine, and I tried to reassure the patient in this regard.  Clearly provokable from a musculoskeletal standpoint.  Chronic overuse from desk positioning likely increase his risk.  Scapular stabilization and retraction would be of benefit.  Tried to review basic scapular protraction in the office.  Recommended MacKenzie protocol C-spine.  Aleve and short course of Valium for muscle relaxation, 1/2-1 tab if needed b.i.d.  Follow-up: prn  New Prescriptions    DIAZEPAM (VALIUM) 5 MG TABLET    Take 1 tablet (5 mg total) by mouth every 12 (twelve) hours as needed for anxiety.   Patient Instructions  Alleve 2 tabs by mouth two times a day over the counter: (GENERIC CHEAPER EQUIVALENT IS NAPROXEN SODIUM)   Look up on Youtube  McKenzie Protocol Cervical Spine      Signed,  Frederico Hamman T. Lavoy Bernards, MD   Patient's Medications  New Prescriptions   DIAZEPAM (VALIUM) 5 MG TABLET    Take 1 tablet (5 mg total) by mouth every 12 (twelve) hours as needed for anxiety.  Previous Medications   DEXLANSOPRAZOLE (DEXILANT) 60 MG CAPSULE    Take 1 capsule (60 mg total) by mouth daily.   GABAPENTIN (NEURONTIN) 300 MG CAPSULE    Take 1 capsule (300 mg total) by mouth at bedtime.   TAMOXIFEN (NOLVADEX) 20 MG TABLET    Take 1 tablet (20 mg total) by mouth daily.   VALACYCLOVIR (VALTREX) 500 MG TABLET  Take twice daily for 3-5 days as needed  Modified Medications   No medications on file  Discontinued Medications   No medications on file

## 2016-01-04 NOTE — Patient Instructions (Signed)
Alleve 2 tabs by mouth two times a day over the counter: (GENERIC CHEAPER EQUIVALENT IS NAPROXEN SODIUM)   Look up on Youtube  McKenzie Protocol Cervical Spine

## 2016-04-19 ENCOUNTER — Other Ambulatory Visit: Payer: Self-pay | Admitting: Family Medicine

## 2016-04-22 ENCOUNTER — Telehealth: Payer: Self-pay | Admitting: Behavioral Health

## 2016-04-22 NOTE — Telephone Encounter (Addendum)
[facility] note received via fax  Call Date: 04/22/16 Time: 6:51 AM   Caller: April Robinson (Self) Return number: [phone]  Nurse: April Hubbard, RN  Chief Complaint: Rash - Widespread  Reason for call: Caller has a rash it has moved a couple of place on her body. Also having some kind of nerve pain. Had a fever as well.  Related visit to physician within the last 2 weeks: No  Guideline: Rash or Redness - Widespread  Disposition: See Physician within 4 Hours (or PCP triage)  Patient voiced that for 6 months or so she's had a rash present that was first seen under the arm pits, but did not have it treated. Just recently patient noticed that there are two different rashes that have developed on her body. One is located on her neck and raises or extends to the nape of her hair line and the other rash is under her right breast. Patient reported itchiness/redness at the sites and extreme fatigue, but no fever, headache, swelling or blisters at this time. However, she voiced that on yesterday some of these symptoms were present, stating that she had a "headache, fever, chest pain, as well as loss of appetite". Patient verbalized that she took gabapentin to relieve headache and other pain. Also, due to her past history of herpes, she stated "starting a round of Valtrex yesterday." Today patient rates her pain 3/10 on a 0-10 pain scale. Offered to schedule patient an appointment with another office, since there are no available appointments here today, but patient declined. Currently, she's already scheduled to see Mackie Pai, PA-C on tomorrow, 04/23/16 at 4:30 PM. Advised patient that if symptoms worsen, seek a nearby urgent care or the emergency department. Patient verbalized understanding and did not have any further concerns prior to the call ending.

## 2016-04-23 ENCOUNTER — Encounter: Payer: Self-pay | Admitting: Medical

## 2016-04-23 ENCOUNTER — Ambulatory Visit (INDEPENDENT_AMBULATORY_CARE_PROVIDER_SITE_OTHER): Payer: 59 | Admitting: Medical

## 2016-04-23 VITALS — BP 120/80 | HR 78 | Temp 98.2°F | Ht 63.0 in | Wt 181.4 lb

## 2016-04-23 DIAGNOSIS — R21 Rash and other nonspecific skin eruption: Secondary | ICD-10-CM | POA: Diagnosis not present

## 2016-04-23 DIAGNOSIS — R739 Hyperglycemia, unspecified: Secondary | ICD-10-CM | POA: Diagnosis not present

## 2016-04-23 DIAGNOSIS — B009 Herpesviral infection, unspecified: Secondary | ICD-10-CM

## 2016-04-23 DIAGNOSIS — R5383 Other fatigue: Secondary | ICD-10-CM

## 2016-04-23 DIAGNOSIS — B029 Zoster without complications: Secondary | ICD-10-CM

## 2016-04-23 DIAGNOSIS — L819 Disorder of pigmentation, unspecified: Secondary | ICD-10-CM

## 2016-04-23 DIAGNOSIS — R0789 Other chest pain: Secondary | ICD-10-CM

## 2016-04-23 DIAGNOSIS — R509 Fever, unspecified: Secondary | ICD-10-CM

## 2016-04-23 MED ORDER — NYSTATIN 100000 UNIT/GM EX CREA: 1.0000 "application " | TOPICAL_CREAM | Freq: Two times a day (BID) | CUTANEOUS | Status: DC

## 2016-04-23 MED ORDER — VALACYCLOVIR HCL 500 MG PO TABS: 500.0000 mg | ORAL_TABLET | Freq: Three times a day (TID) | ORAL | Status: DC

## 2016-04-23 MED ORDER — AMOXICILLIN 500 MG PO CAPS: 500.0000 mg | ORAL_CAPSULE | Freq: Two times a day (BID) | ORAL | Status: DC

## 2016-04-23 NOTE — Progress Notes (Signed)
Pre visit review using our clinic review tool, if applicable. No additional management support is needed unless otherwise documented below in the visit note. 

## 2016-04-23 NOTE — Patient Instructions (Addendum)
You had initial symptoms were viral like but with recent ear pain and dental work I do think good idea to rx amoxicillin antibiotic.  Your ekg was normal.  For possible early shingles left rib area recommend increasing valtrex to 3 times a day.  For rash on neck and under breast/upper abdomen area recommend you [place] mycostatin. We will get labs tomorrow to check average blood sugar. Sometimes high sugar can lead to fungal infection and hyperpigmented skin.  For recent fever and fatigue will get cbc and cmp. Pt will get labs tomorrow. Our lab is closed when she left.  Follow up in 7-10 days or as needed

## 2016-04-23 NOTE — Progress Notes (Signed)
Subjective:    Patient ID: April Robinson, female    DOB: [birth year], 55 y.o.   MRN: [id number]  HPI   Pt in on back side of her neck for a few months. Also some rash upper abdomen rt side for 3-4 months. These areas do itch. Pt had occasional very mild sugar elevation.   Pt states also she has herpes outbreak every one in a while(reports one recently). She had out break of shingles before. Recent left lower rib area pain superficial on skin.   She reports fever,  rt ear ache, fatigue and body aches all week.. Today she feels better. Pt did not have any st. But maybe some jaw area pain rt side. Points to rt submandibular area. On Tuesday low grade fever 99-100. Some dental work done 3 weeks ago.  Pt states night before she had some chest pain. Lasted throughout the night. But none recently.      Review of Systems  Constitutional: Positive for fever and fatigue. Negative for chills.  HENT: Positive for ear pain. Negative for congestion, ear discharge, rhinorrhea, sinus pressure, sore throat and tinnitus.        Jaw region pain. Rt ear area pain.  Eyes: Negative for pain.  Respiratory: Negative for chest tightness, shortness of breath and wheezing.   Cardiovascular: Negative for chest pain and palpitations.       None know but some chest pain  2 nights ago.  Gastrointestinal: Negative for abdominal pain.  Musculoskeletal: Positive for myalgias.       Better now.  Skin: Positive for rash.       Very sensitive skin left rib area. Light touch but no rash.  Neurological: Negative for dizziness, speech difficulty, weakness and headaches.  Hematological: Negative for adenopathy. Does not bruise/bleed easily.  Psychiatric/Behavioral: Negative for behavioral problems and confusion.        Objective:   Physical Exam  General Mental Status- Alert. General Appearance- Not in acute distress.     HEENT Head- Normal. Ear Auditory Canal - Left- Normal. Right - Normal.Tympanic  Membrane- Left- Normal. Right- Normal. Eye Sclera/Conjunctiva- Left- Normal. Right- Normal. Nose & Sinuses Nasal Mucosa- Left-  Boggy and Congested. Right-  Boggy and  Congested.Bilateral no  maxillary and no  frontal sinus pressure. Mouth & Throat Lips: Upper Lip- Normal: no dryness, cracking, pallor, cyanosis, or vesicular eruption. Lower Lip-Normal: no dryness, cracking, pallor, cyanosis or vesicular eruption. Buccal Mucosa- Bilateral- No Aphthous ulcers. Oropharynx- No Discharge or Erythema. Tonsils: Characteristics- Bilateral- No Erythema or Congestion. Size/Enlargement- Bilateral- No enlargement. Discharge- bilateral-None. Mouth- rt sidelower teeth. Looks like has some dental work. Back molar looks to have very irregular filling. Like potion of tooth broke.(filling placed instead of crown)   Skin   Back of neck appears to have area of hyperpigmented skin. Some under rt breast region as well.  Neck Carotid Arteries- Normal color. Moisture- Normal Moisture. No carotid bruits. No JVD.  Chest and Lung Exam Auscultation: Breath Sounds:-Normal.  Cardiovascular Auscultation:Rythm- Regular. Murmurs & Other Heart Sounds:Auscultation of the heart reveals- No Murmurs.  Abdomen Inspection:-Inspeection Normal. Palpation/Percussion:Note:No mass. Palpation and Percussion of the abdomen reveal- Non Tender, Non Distended + BS, no rebound or guarding.    Neurologic Cranial Nerve exam:- CN III-XII intact(No nystagmus), symmetric smile. Strength:- 5/5 equal and symmetric strength both upper and lower extremities.  Lt rib area- skin is very tender to even light touch. No rash of vesicles.    Assessment & Plan:  You  had initial symptoms were viral like but with recent ear pain and dental work I do think good idea to rx amoxicillin antibiotic.  Your ekg was normal.  For possilble early shingles left rib area recommend increasing valtrex to 3 times a day.  For rash on neck and under  breast/upper abdomen area recommend you [place] mycostatin. We will get labs tomorrow to check average blood sugar. Sometimes high sugar can lead to fungal infection and hyperpigmented skin.  For recent fever and fatigue will get cbc and cmp.  Follow up in 7-10 days or as needed

## 2016-04-24 ENCOUNTER — Other Ambulatory Visit (INDEPENDENT_AMBULATORY_CARE_PROVIDER_SITE_OTHER): Payer: 59

## 2016-04-24 DIAGNOSIS — R509 Fever, unspecified: Secondary | ICD-10-CM | POA: Diagnosis not present

## 2016-04-24 DIAGNOSIS — R5383 Other fatigue: Secondary | ICD-10-CM | POA: Diagnosis not present

## 2016-04-24 DIAGNOSIS — R739 Hyperglycemia, unspecified: Secondary | ICD-10-CM | POA: Diagnosis not present

## 2016-04-24 LAB — CBC WITH DIFFERENTIAL/PLATELET
BASOS ABS: 0 10*3/uL (ref 0.0–0.1)
Basophils Relative: 0.3 % (ref 0.0–3.0)
EOS ABS: 0.1 10*3/uL (ref 0.0–0.7)
Eosinophils Relative: 1.3 % (ref 0.0–5.0)
HEMATOCRIT: 41.2 % (ref 36.0–46.0)
HEMOGLOBIN: 13.6 g/dL (ref 12.0–15.0)
LYMPHS PCT: 41.7 % (ref 12.0–46.0)
Lymphs Abs: 2 10*3/uL (ref 0.7–4.0)
MCHC: 33 g/dL (ref 30.0–36.0)
MCV: 86.1 fl (ref 78.0–100.0)
MONOS PCT: 5.2 % (ref 3.0–12.0)
Monocytes Absolute: 0.2 10*3/uL (ref 0.1–1.0)
Neutro Abs: 2.4 10*3/uL (ref 1.4–7.7)
Neutrophils Relative %: 51.5 % (ref 43.0–77.0)
Platelets: 222 10*3/uL (ref 150.0–400.0)
RBC: 4.78 Mil/uL (ref 3.87–5.11)
RDW: 14.2 % (ref 11.5–15.5)
WBC: 4.7 10*3/uL (ref 4.0–10.5)

## 2016-04-24 LAB — HEMOGLOBIN A1C: Hgb A1c MFr Bld: 5.5 % (ref 4.6–6.5)

## 2016-04-24 LAB — COMPREHENSIVE METABOLIC PANEL
ALBUMIN: 3.9 g/dL (ref 3.5–5.2)
ALT: 14 U/L (ref 0–35)
AST: 14 U/L (ref 0–37)
Alkaline Phosphatase: 48 U/L (ref 39–117)
BUN: 14 mg/dL (ref 6–23)
CALCIUM: 9.1 mg/dL (ref 8.4–10.5)
CHLORIDE: 107 meq/L (ref 96–112)
CO2: 31 mEq/L (ref 19–32)
CREATININE: 1.04 mg/dL (ref 0.40–1.20)
GFR: 70.38 mL/min (ref >60.00)
Glucose, Bld: 87 mg/dL (ref 70–99)
Potassium: 3.9 mEq/L (ref 3.5–5.1)
Sodium: 141 mEq/L (ref 135–145)
Total Bilirubin: 0.5 mg/dL (ref 0.2–1.2)
Total Protein: 6.7 g/dL (ref 6.0–8.3)

## 2016-04-27 ENCOUNTER — Telehealth: Payer: Self-pay | Admitting: Medical

## 2016-04-27 ENCOUNTER — Telehealth: Payer: Self-pay

## 2016-04-27 MED ORDER — TRAMADOL HCL 50 MG PO TABS: 50.0000 mg | ORAL_TABLET | Freq: Three times a day (TID) | ORAL | Status: DC | PRN

## 2016-04-27 NOTE — Telephone Encounter (Signed)
Called patient advised that medication (Ultram) has been faxed to pharmacy and the because this will not be longterm she will not have to have a UDS per provider.

## 2016-04-27 NOTE — Telephone Encounter (Signed)
Called patient and discussed lab results

## 2016-04-27 NOTE — Telephone Encounter (Signed)
Call pt and let her now will rx tramadol for pain. This will print out but should be able to fax to her pharamacy.

## 2016-04-27 NOTE — Telephone Encounter (Signed)
Patient called in for lab results. States she is in a lot of pain becausae of her Shingles. Would like to know if anything could be done for pain.

## 2016-05-04 ENCOUNTER — Encounter: Payer: Self-pay | Admitting: Medical

## 2016-05-04 ENCOUNTER — Ambulatory Visit (INDEPENDENT_AMBULATORY_CARE_PROVIDER_SITE_OTHER): Payer: 59 | Admitting: Medical

## 2016-05-04 ENCOUNTER — Telehealth: Payer: Self-pay | Admitting: Family Medicine

## 2016-05-04 VITALS — BP 122/84 | HR 77 | Temp 98.1°F | Ht 63.0 in | Wt 179.8 lb

## 2016-05-04 DIAGNOSIS — M792 Neuralgia and neuritis, unspecified: Secondary | ICD-10-CM | POA: Diagnosis not present

## 2016-05-04 DIAGNOSIS — B009 Herpesviral infection, unspecified: Secondary | ICD-10-CM | POA: Diagnosis not present

## 2016-05-04 MED ORDER — HYDROCODONE-ACETAMINOPHEN 5-325 MG PO TABS: ORAL_TABLET | ORAL | Status: DC

## 2016-05-04 MED ORDER — GABAPENTIN 100 MG PO CAPS: 100.0000 mg | ORAL_CAPSULE | Freq: Every day | ORAL | Status: DC

## 2016-05-04 MED ORDER — VALACYCLOVIR HCL 500 MG PO TABS: ORAL_TABLET | ORAL | Status: DC

## 2016-05-04 NOTE — Patient Instructions (Addendum)
You do appear to have shingles nerve pain. Sharp pain to light touch following dermatomal nerve pattern.  I will rx low dose norco and low dose gabapentin. Continue ibubrofen.(can take 600-800 mg every 8 hours)  Call me on Friday and give me update on how you are doing.   May have you follow up early next week when Dr. Etter Sjogren in so I can discuss your presentation with her.

## 2016-05-04 NOTE — Progress Notes (Signed)
Pre visit review using our clinic review tool, if applicable. No additional management support is needed unless otherwise documented below in the visit note. 

## 2016-05-04 NOTE — Telephone Encounter (Signed)
Pt has an appt scheduled with Mackie Pai, PA today (05/04/16) at 1 pm.

## 2016-05-04 NOTE — Addendum Note (Signed)
Addended by: Anabel Halon on: 05/04/2016 01:54 PM   Modules accepted: Orders

## 2016-05-04 NOTE — Telephone Encounter (Signed)
[facility] - Client TELEPHONE ADVICE RECORD [facility] Patient Name: April Robinson DOB: 01-19-1961 Initial Comment caller states she has shingles pain Nurse Assessment Nurse: Dimas Chyle, RN, Dellis Filbert Date/Time Eilene Ghazi Time): 05/04/2016 8:25:06 AM Confirm and document reason for call. If symptomatic, describe symptoms. You must click the next button to save text entered. ---Caller states she has shingles pain. Seen in office 2 weeks ago. Was taking Tramadol for pain. Having pain in upper back. Has the patient traveled out of the country within the last 30 days? ---No Does the patient have any new or worsening symptoms? ---Yes Will a triage be completed? ---Yes Related visit to physician within the last 2 weeks? ---Yes Does the PT have any chronic conditions? (i.e. diabetes, asthma, etc.) ---No Is this a behavioral health or substance abuse call? ---No Guidelines Guideline Title Affirmed Question Affirmed Notes Shingles SEVERE pain (e.g., excruciating) Final Disposition User See Physician within 24 Hours Dimas Chyle, RN, [employer] Referrals REFERRED TO PCP OFFICE Disagree/Comply: Leta Baptist

## 2016-05-04 NOTE — Progress Notes (Addendum)
Subjective:    Patient ID: April Robinson, female    DOB: 07-28-1961, 55 y.o.   MRN: [id number]  HPI  Pt in with left lower rib area pain. Pain was little atypical.she never got a rash. But from left lower rib area to her left para thoracic area she would have sharp. Sensitive to light touch from her hair, clothes and covers. She never got vesicular eruption. Pain present without movement. Pain feels like burning aches. Occasional sharp stab. On last visit I prescribed valtrex tid. Pt states difficult to lean in chair because skin left side of back so sensitive. Pt states ibuprofen seemed to ultram. Pt oncologist wrote her gabapentin. It made her loopy and tired.   Pt will see her oncologist in October. This side she lumpectomy. Last mammogram April or march bilateral mammogram recommended repeat Dec 2016.   She update me that rib area not hurting. She point mid axillary region that radiates to left parthoracic area.  Reports feeling tired. She states sleep quality gone down since has pain. Hurts for her to lie on her back.  Review of Systems  Constitutional: Negative for fever, chills and fatigue.  Respiratory: Negative for cough, chest tightness and stridor.   Cardiovascular: Negative for chest pain and palpitations.  Gastrointestinal: Negative for abdominal pain.  Musculoskeletal: Negative for back pain.  Neurological:       See hpi. Sharp pain dermotmal pattern.  Hematological: Negative for adenopathy. Does not bruise/bleed easily.  Psychiatric/Behavioral: Negative for hallucinations, behavioral problems, dysphoric mood and decreased concentration.   Past Medical History  Diagnosis Date  . Alcoholism ([place])   . Arthritis   . Personal history of colonic polyps     TUBULAR ADENOMA  . Hyperlipemia   . Esophageal reflux   . ADD (attention deficit disorder with hyperactivity)   . Hiatal hernia   . Hx of radiation therapy 08/27/11 to 10/15/11    L breast     Social History    Social History  . Marital Status: Married    Spouse Name: Nathaneil Canary  . Number of Children: 3  . Years of Education: 12+   Occupational History  . Customer Service   .     Social History Main Topics  . Smoking status: Former Research scientist (life sciences)  . Smokeless tobacco: Never Used  . Alcohol Use: No  . Drug Use: No  . Sexual Activity:    Partners: Male    Birth Control/ Protection: Surgical     Comment: [name]  , SEXUAL PARTNERS MORE THAN 5   Other Topics Concern  . Not on file   Social History Narrative   Lives with her husband. Their children (her 3 and his one) live locally.    Past Surgical History  Procedure Laterality Date  . Laparoscopic hysterectomy  1995  . Abdominal hysterectomy    . Breast lumpectomy  07/23/11    left breast    Family History  Problem Relation Age of Onset  . Breast cancer Mother   . Cancer Mother     breast  . Colon cancer Neg Hx   . Diabetes Father   . Hypertension Father   . Cancer Brother     NEUROENDOCRINE  . Hypertension Brother   . Arthritis Brother     hips    Allergies  Allergen Reactions  . Meloxicam Shortness Of Breath and Nausea Only  . Latex   . Miconazole Nitrate Other (See Comments)    SWELLING OF SOFT  PALATE THAT CAUSED DIFFICULTY SWALLOWING    Current Outpatient Prescriptions on File Prior to Visit  Medication Sig Dispense Refill  . DEXILANT 60 MG capsule TAKE 1 CAPSULE (60 MG TOTAL) BY MOUTH DAILY. 30 capsule 3  . gabapentin (NEURONTIN) 300 MG capsule Take 1 capsule (300 mg total) by mouth at bedtime. 90 capsule 4  . nystatin cream (MYCOSTATIN) Apply 1 application topically 2 (two) times daily. 30 g 0  . tamoxifen (NOLVADEX) 20 MG tablet Take 1 tablet (20 mg total) by mouth daily. 90 tablet 3  . valACYclovir (VALTREX) 500 MG tablet Take twice daily for 3-5 days as needed 30 tablet 12   No current facility-administered medications on file prior to visit.    BP 122/84 mmHg  Pulse 77  Temp(Src) 98.1 F  (36.7 C) (Oral)  Ht 5\' 3"  (1.6 m)  Wt 179 lb 12.8 oz (81.557 kg)  BMI 31.86 kg/m2  SpO2 98%       Objective:   Physical Exam   General- No acute distress. Pleasant patient. Neck- Full range of motion, no jvd Lungs- Clear, even and unlabored. Heart- regular rate and rhythm. Neurologic- CNII- XII grossly intact.  Skin- left side thorax- mid rib axillary level sensitive all the way to her lt thorax area. Tender to light touch all dermatomal pattern. No lesion or vesicles seen. No wamth to skin.     Assessment & Plan:  You do appear to have shingles nerve pain. Sharp pain to light touch following dermatomal nerve pattern.  I will rx low dose norco and low dose gabapentin. Continue ibubrofen.(can take 600-800 mg every 8 hours)  Call me on Friday and give me update on how you are doing.   May have you follow up early next week when Dr. Etter Sjogren in so I can discuss your presentation with her.

## 2016-05-08 ENCOUNTER — Telehealth: Payer: Self-pay | Admitting: Family Medicine

## 2016-05-08 NOTE — Telephone Encounter (Signed)
Caller name: Relationship to patient: Self Can be reached: [phone]     Reason for call: Patient was seen by Percell Miller and told to call him today to inform him of how the medication is working. Plse call back

## 2016-05-08 NOTE — Telephone Encounter (Signed)
Spoke with pt and she states that she was told to let the provider know that she does not feel mch better today and she would like to know what she would need to do at this time. Pt states the pain is some better and she states that her job will not let her stay out much longer. Pt reports that she had some blurry vision this morning. Pt was advised to go to the ER and she voices understanding. Spoke with provider and he advised the pt to take the hydrocodone and then if no relief in 2 hours to take ibuprofen. Pt voices understanding.

## 2016-05-08 NOTE — Telephone Encounter (Signed)
April Robinson to tell pt if her blurred vision returns then go to ED. I got message that is was this am.

## 2016-06-17 ENCOUNTER — Other Ambulatory Visit: Payer: Self-pay | Admitting: Women's Health

## 2016-06-17 DIAGNOSIS — B009 Herpesviral infection, unspecified: Secondary | ICD-10-CM

## 2016-06-18 ENCOUNTER — Other Ambulatory Visit: Payer: Self-pay | Admitting: *Deleted

## 2016-06-18 MED ORDER — VALACYCLOVIR HCL 500 MG PO TABS: ORAL_TABLET | ORAL | 6 refills | Status: DC

## 2016-06-18 NOTE — Telephone Encounter (Signed)
Rx sent 

## 2016-06-18 NOTE — Telephone Encounter (Signed)
Okay Valtrex 500 twice daily for 3-5 days as needed #30 with 6 refills.

## 2016-06-18 NOTE — Telephone Encounter (Signed)
Pt calling requesting Rx for Valtrex, rare outbreaks,okay to fill? Pt has never had this Rx prescribed by you. Please advise

## 2016-07-15 ENCOUNTER — Ambulatory Visit (HOSPITAL_BASED_OUTPATIENT_CLINIC_OR_DEPARTMENT_OTHER)
Admission: RE | Admit: 2016-07-15 | Discharge: 2016-07-15 | Disposition: A | Discharge status: Home / Self Care | Payer: 59 | Source: Ambulatory Visit | Attending: Medical | Admitting: Medical

## 2016-07-15 ENCOUNTER — Encounter: Payer: Self-pay | Admitting: Medical

## 2016-07-15 ENCOUNTER — Ambulatory Visit (INDEPENDENT_AMBULATORY_CARE_PROVIDER_SITE_OTHER): Payer: 59 | Admitting: Medical

## 2016-07-15 VITALS — BP 133/87 | HR 83 | Temp 98.1°F | Ht 63.0 in | Wt 184.4 lb

## 2016-07-15 DIAGNOSIS — R05 Cough: Secondary | ICD-10-CM | POA: Diagnosis not present

## 2016-07-15 DIAGNOSIS — R059 Cough, unspecified: Secondary | ICD-10-CM

## 2016-07-15 DIAGNOSIS — R0981 Nasal congestion: Secondary | ICD-10-CM

## 2016-07-15 DIAGNOSIS — J309 Allergic rhinitis, unspecified: Secondary | ICD-10-CM | POA: Diagnosis not present

## 2016-07-15 MED ORDER — BENZONATATE 100 MG PO CAPS: 100.0000 mg | ORAL_CAPSULE | Freq: Three times a day (TID) | ORAL | 0 refills | Status: DC | PRN

## 2016-07-15 MED ORDER — LEVOCETIRIZINE DIHYDROCHLORIDE 5 MG PO TABS: 5.0000 mg | ORAL_TABLET | Freq: Every evening | ORAL | 1 refills | Status: DC

## 2016-07-15 MED ORDER — FLUTICASONE PROPIONATE 50 MCG/ACT NA SUSP: 2.0000 | Freq: Every day | NASAL | 1 refills | Status: DC

## 2016-07-15 MED ORDER — AZITHROMYCIN 250 MG PO TABS: ORAL_TABLET | ORAL | 0 refills | Status: DC

## 2016-07-15 MED ORDER — ALBUTEROL SULFATE HFA 108 (90 BASE) MCG/ACT IN AERS: 2.0000 | INHALATION_SPRAY | Freq: Four times a day (QID) | RESPIRATORY_TRACT | 0 refills | Status: DC | PRN

## 2016-07-15 NOTE — Patient Instructions (Addendum)
You appear to have started with allergic rhinitis and developed subsequent severe cough.   Will rx flonase nasal spray, xyzal antihistamine and benzonatate for cough.(use benzonatate if tussin DM not stopping the cough/but not both for cough).  Since cough is severe want to get cxr today.  rx azithromycin for bronchitis.  If you develop any wheezing then making albuterol inhaler available.  Follow up in 7 days or as needed

## 2016-07-15 NOTE — Progress Notes (Signed)
Subjective:    Patient ID: April Robinson, female    DOB: 1960-12-09, 55 y.o.   MRN: [id number]  HPI  Pt in with some recent nasal congestion, pnd  and scratch throat over weekend. Then this week she got cough that is getting worse. Cough controlled at night. But at work during the day will get coughing.   Pt states tussin dm stops cough at night but makes her drowsy.   No wheezing with cough. No history of inhaler use.  Pt denies any fall/seasonal allergies.     Review of Systems  Constitutional: Negative for chills, fatigue and fever.  HENT: Positive for congestion, postnasal drip and sneezing. Negative for nosebleeds, rhinorrhea and sinus pressure.   Respiratory: Positive for cough. Negative for chest tightness, shortness of breath and wheezing.   Cardiovascular: Negative for chest pain and palpitations.  Gastrointestinal: Negative for abdominal pain.  Musculoskeletal: Negative for back pain, joint swelling, myalgias, neck pain and neck stiffness.       No leg pain reported.  Neurological: Negative for dizziness and headaches.  Hematological: Negative for adenopathy. Does not bruise/bleed easily.  Psychiatric/Behavioral: Negative for behavioral problems and confusion.   Past Medical History:  Diagnosis Date  . ADD (attention deficit disorder with hyperactivity)   . Alcoholism ([place])   . Arthritis   . Esophageal reflux   . Hiatal hernia   . Hx of radiation therapy 08/27/11 to 10/15/11   L breast  . Hyperlipemia   . Personal history of colonic polyps    TUBULAR ADENOMA     Social History   Social History  . Marital status: Married    Spouse name: Nathaneil Canary  . Number of children: 3  . Years of education: 12+   Occupational History  . Customer Service [employer]  .  [employer]ing [employer]   Social History Main Topics  . Smoking status: Former Research scientist (life sciences)  . Smokeless tobacco: Never Used  . Alcohol use No  . Drug use: No  . Sexual activity: Yes    Partners: Male   Birth control/ protection: Surgical     Comment: [place]GE[place]  , SEXUAL PARTNERS MORE THAN 5   Other Topics Concern  . Not on file   Social History Narrative   Lives with her husband. Their children (her 3 and his one) live locally.    Past Surgical History:  Procedure Laterality Date  . ABDOMINAL HYSTERECTOMY    . BREAST LUMPECTOMY  07/23/11   left breast  . LAPAROSCOPIC HYSTERECTOMY  1995    Family History  Problem Relation Age of Onset  . Breast cancer Mother   . Cancer Mother     breast  . Colon cancer Neg Hx   . Diabetes Father   . Hypertension Father   . Cancer Brother     NEUROENDOCRINE  . Hypertension Brother   . Arthritis Brother     hips    Allergies  Allergen Reactions  . Meloxicam Shortness Of Breath and Nausea Only  . Latex   . Miconazole Nitrate Other (See Comments)    SWELLING OF SOFT PALATE THAT CAUSED DIFFICULTY SWALLOWING    Current Outpatient Prescriptions on File Prior to Visit  Medication Sig Dispense Refill  . DEXILANT 60 MG capsule TAKE 1 CAPSULE (60 MG TOTAL) BY MOUTH DAILY. 30 capsule 3  . gabapentin (NEURONTIN) 100 MG capsule Take 1 capsule (100 mg total) by mouth at bedtime. 30 capsule 3  . HYDROcodone-acetaminophen (NORCO) 5-325 MG tablet  1 tab po q 6 hours prn pain 14 tablet 0  . tamoxifen (NOLVADEX) 20 MG tablet Take 1 tablet (20 mg total) by mouth daily. 90 tablet 3  . valACYclovir (VALTREX) 500 MG tablet Take one tab twice daily for 3-5 days with outbreak. 30 tablet 6  . nystatin cream (MYCOSTATIN) Apply 1 application topically 2 (two) times daily. (Patient not taking: Reported on 07/15/2016) 30 g 0   No current facility-administered medications on file prior to visit.     BP 133/87   Pulse 83   Temp 98.1 F (36.7 C) (Oral)   Ht 5\' 3"  (1.6 m)   Wt 184 lb 6.4 oz (83.6 kg)   SpO2 97%   BMI 32.66 kg/m       Objective:   Physical Exam  General  Mental Status - Alert. General Appearance - Well groomed. Not in acute  distress. But coughs sporacidally on exam. Most of time when she is trying to talk.  Skin Rashes- No Rashes.  HEENT Head- Normal. Ear Auditory Canal - Left- Normal. Right - Normal.Tympanic Membrane- Left- mild dull tm  Right- mild dull tm. Eye Sclera/Conjunctiva- Left- Normal. Right- Normal. Nose & Sinuses Nasal Mucosa- Left-  Boggy and Congested. Right-  Boggy and  Congested.Bilateral  No maxillary and no  frontal sinus pressure. Mouth & Throat Lips: Upper Lip- Normal: no dryness, cracking, pallor, cyanosis, or vesicular eruption. Lower Lip-Normal: no dryness, cracking, pallor, cyanosis or vesicular eruption. Buccal Mucosa- Bilateral- No Aphthous ulcers. Oropharynx- No Discharge or Erythema. +PND. Tonsils: Characteristics- Bilateral- No Erythema or Congestion. Size/Enlargement- Bilateral- No enlargement. Discharge- bilateral-None.  Neck Neck- Supple. No Masses.   Chest and Lung Exam Auscultation: Breath Sounds:- even and unlabored.  Cardiovascular Auscultation:Rythm- Regular, rate and rhythm. Murmurs & Other Heart Sounds:Ausculatation of the heart reveal- No Murmurs.  Lymphatic Head & Neck General Head & Neck Lymphatics: Bilateral: Description- No Localized lymphadenopathy.       Assessment & Plan:  You appear to have started with allergic rhinitis and developed subsequent severe cough.   Will rx flonase nasal spray, xyzal antihistamine and benzonatate for cough.(use benzonatate if tussin DM not stopping the cough/but not both for cough).  Since cough is severe want to get cxr today.  rx azithromycin for bronchitis.  If you develop any wheezing then making albuterol inhaler available.  Follow up in 7 days or as needed  Rasheda Ledger, Percell Miller, [employer]

## 2016-07-16 ENCOUNTER — Encounter: Payer: Self-pay | Admitting: Women's Health

## 2016-07-16 ENCOUNTER — Ambulatory Visit (INDEPENDENT_AMBULATORY_CARE_PROVIDER_SITE_OTHER): Payer: 59 | Admitting: Women's Health

## 2016-07-16 VITALS — BP 132/80 | Ht 63.0 in | Wt 183.0 lb

## 2016-07-16 DIAGNOSIS — Z01419 Encounter for gynecological examination (general) (routine) without abnormal findings: Secondary | ICD-10-CM | POA: Diagnosis not present

## 2016-07-16 DIAGNOSIS — B009 Herpesviral infection, unspecified: Secondary | ICD-10-CM | POA: Diagnosis not present

## 2016-07-16 MED ORDER — VALACYCLOVIR HCL 500 MG PO TABS: ORAL_TABLET | ORAL | 6 refills | Status: DC

## 2016-07-16 NOTE — Patient Instructions (Addendum)
Menopause is a normal process in which your reproductive ability comes to an end. This process happens gradually over a span of months to years, usually between the ages of 48 and 55. Menopause is complete when you have missed 12 consecutive menstrual periods. It is important to talk with your health care provider about some of the most common conditions that affect postmenopausal women, such as heart disease, cancer, and bone loss (osteoporosis). Adopting a healthy lifestyle and getting preventive care can help to promote your health and wellness. Those actions can also lower your chances of developing some of these common conditions. WHAT SHOULD I KNOW ABOUT MENOPAUSE? During menopause, you may experience a number of symptoms, such as:  Moderate-to-severe hot flashes.  Night sweats.  Decrease in sex drive.  Mood swings.  Headaches.  Tiredness.  Irritability.  Memory problems.  Insomnia. Choosing to treat or not to treat menopausal changes is an individual decision that you make with your health care provider. WHAT SHOULD I KNOW ABOUT HORMONE REPLACEMENT THERAPY AND SUPPLEMENTS? Hormone therapy products are effective for treating symptoms that are associated with menopause, such as hot flashes and night sweats. Hormone replacement carries certain risks, especially as you become older. If you are thinking about using estrogen or estrogen with progestin treatments, discuss the benefits and risks with your health care provider. WHAT SHOULD I KNOW ABOUT HEART DISEASE AND STROKE? Heart disease, heart attack, and stroke become more likely as you age. This may be due, in part, to the hormonal changes that your body experiences during menopause. These can affect how your body processes dietary fats, triglycerides, and cholesterol. Heart attack and stroke are both medical emergencies. There are many things that you can do to help prevent heart disease and stroke:  Have your blood pressure  checked at least every 1-2 years. High blood pressure causes heart disease and increases the risk of stroke.  If you are 55-79 years old, ask your health care provider if you should take aspirin to prevent a heart attack or a stroke.  Do not use any tobacco products, including cigarettes, chewing tobacco, or electronic cigarettes. If you need help quitting, ask your health care provider.  It is important to eat a healthy diet and maintain a healthy weight.  Be sure to include plenty of vegetables, fruits, low-fat dairy products, and lean protein.  Avoid eating foods that are high in solid fats, added sugars, or salt (sodium).  Get regular exercise. This is one of the most important things that you can do for your health.  Try to exercise for at least 150 minutes each week. The type of exercise that you do should increase your heart rate and make you sweat. This is known as moderate-intensity exercise.  Try to do strengthening exercises at least twice each week. Do these in addition to the moderate-intensity exercise.  Know your numbers.Ask your health care provider to check your cholesterol and your blood glucose. Continue to have your blood tested as directed by your health care provider. WHAT SHOULD I KNOW ABOUT CANCER SCREENING? There are several types of cancer. Take the following steps to reduce your risk and to catch any cancer development as early as possible. Breast Cancer  Practice breast self-awareness.  This means understanding how your breasts normally appear and feel.  It also means doing regular breast self-exams. Let your health care provider know about any changes, no matter how small.  If you are 40 or older, have a clinician do a   breast exam (clinical breast exam or CBE) every year. Depending on your age, family history, and medical history, it may be recommended that you also have a yearly breast X-ray (mammogram).  If you have a family history of breast cancer,  talk with your health care provider about genetic screening.  If you are at high risk for breast cancer, talk with your health care provider about having an MRI and a mammogram every year.  Breast cancer (BRCA) gene test is recommended for women who have family members with BRCA-related cancers. Results of the assessment will determine the need for genetic counseling and BRCA1 and for BRCA2 testing. BRCA-related cancers include these types:  Breast. This occurs in males or females.  Ovarian.  Tubal. This may also be called fallopian tube cancer.  Cancer of the abdominal or pelvic lining (peritoneal cancer).  Prostate.  Pancreatic. Cervical, Uterine, and Ovarian Cancer Your health care provider may recommend that you be screened regularly for cancer of the pelvic organs. These include your ovaries, uterus, and vagina. This screening involves a pelvic exam, which includes checking for microscopic changes to the surface of your cervix (Pap test).  For women ages 21-65, health care providers may recommend a pelvic exam and a Pap test every three years. For women ages 77-65, they may recommend the Pap test and pelvic exam, combined with testing for human papilloma virus (HPV), every five years. Some types of HPV increase your risk of cervical cancer. Testing for HPV may also be done on women of any age who have unclear Pap test results.  Other health care providers may not recommend any screening for nonpregnant women who are considered low risk for pelvic cancer and have no symptoms. Ask your health care provider if a screening pelvic exam is right for you.  If you have had past treatment for cervical cancer or a condition that could lead to cancer, you need Pap tests and screening for cancer for at least 20 years after your treatment. If Pap tests have been discontinued for you, your risk factors (such as having a new sexual partner) need to be reassessed to determine if you should start having  screenings again. Some women have medical problems that increase the chance of getting cervical cancer. In these cases, your health care provider may recommend that you have screening and Pap tests more often.  If you have a family history of uterine cancer or ovarian cancer, talk with your health care provider about genetic screening.  If you have vaginal bleeding after reaching menopause, tell your health care provider.  There are currently no reliable tests available to screen for ovarian cancer. Lung Cancer Lung cancer screening is recommended for adults 3-70 years old who are at high risk for lung cancer because of a history of smoking. A yearly low-dose CT scan of the lungs is recommended if you:  Currently smoke.  Have a history of at least 30 pack-years of smoking and you currently smoke or have quit within the past 15 years. A pack-year is smoking an average of one pack of cigarettes per day for one year. Yearly screening should:  Continue until it has been 15 years since you quit.  Stop if you develop a health problem that would prevent you from having lung cancer treatment. Colorectal Cancer  This type of cancer can be detected and can often be prevented.  Routine colorectal cancer screening usually begins at age 38 and continues through age 12.  If you have  risk factors for colon cancer, your health care provider may recommend that you be screened at an earlier age.  If you have a family history of colorectal cancer, talk with your health care provider about genetic screening.  Your health care provider may also recommend using home test kits to check for hidden blood in your stool.  A small camera at the end of a tube can be used to examine your colon directly (sigmoidoscopy or colonoscopy). This is done to check for the earliest forms of colorectal cancer.  Direct examination of the colon should be repeated every 5-10 years until age 67. However, if early forms of  precancerous polyps or small growths are found or if you have a family history or genetic risk for colorectal cancer, you may need to be screened more often. Skin Cancer  Check your skin from head to toe regularly.  Monitor any moles. Be sure to tell your health care provider:  About any new moles or changes in moles, especially if there is a change in a mole's shape or color.  If you have a mole that is larger than the size of a pencil eraser.  If any of your family members has a history of skin cancer, especially at a young age, talk with your health care provider about genetic screening.  Always use sunscreen. Apply sunscreen liberally and repeatedly throughout the day.  Whenever you are outside, protect yourself by wearing long sleeves, pants, a wide-brimmed hat, and sunglasses. WHAT SHOULD I KNOW ABOUT OSTEOPOROSIS? Osteoporosis is a condition in which bone destruction happens more quickly than new bone creation. After menopause, you may be at an increased risk for osteoporosis. To help prevent osteoporosis or the bone fractures that can happen because of osteoporosis, the following is recommended:  If you are 39-61 years old, get at least 1,000 mg of calcium and at least 600 mg of vitamin D per day.  If you are older than age 16 but younger than age 7, get at least 1,200 mg of calcium and at least 600 mg of vitamin D per day.  If you are older than age 47, get at least 1,200 mg of calcium and at least 800 mg of vitamin D per day. Smoking and excessive alcohol intake increase the risk of osteoporosis. Eat foods that are rich in calcium and vitamin D, and do weight-bearing exercises several times each week as directed by your health care provider. WHAT SHOULD I KNOW ABOUT HOW MENOPAUSE AFFECTS April Robinson? Depression may occur at any age, but it is more common as you become older. Common symptoms of depression include:  Low or sad mood.  Changes in sleep patterns.  Changes  in appetite or eating patterns.  Feeling an overall lack of motivation or enjoyment of activities that you previously enjoyed.  Frequent crying spells. Talk with your health care provider if you think that you are experiencing depression. WHAT SHOULD I KNOW ABOUT IMMUNIZATIONS? It is important that you get and maintain your immunizations. These include:  Tetanus, diphtheria, and pertussis (Tdap) booster vaccine.  Influenza every year before the flu season begins.  Pneumonia vaccine.  Shingles vaccine. Your health care provider may also recommend other immunizations.   This information is not intended to replace advice given to you by your health care provider. Make sure you discuss any questions you have with your health care provider.   Document Released: 12/04/2005 Document Revised: 11/02/2014 Document Reviewed: 06/14/2014 Elsevier Interactive Patient Education 2016 [employer]  Low-Sodium Eating Plan Sodium raises blood pressure and causes water to be held in the body. Getting less sodium from food will help lower your blood pressure, reduce any swelling, and protect your heart, liver, and kidneys. We get sodium by adding salt (sodium chloride) to food. Most of our sodium comes from canned, boxed, and frozen foods. Restaurant foods, fast foods, and pizza are also very high in sodium. Even if you take medicine to lower your blood pressure or to reduce fluid in your body, getting less sodium from your food is important. WHAT IS MY PLAN? Most people should limit their sodium intake to 2,300 mg a day. Your health care provider recommends that you limit your sodium intake to __________ a day.  WHAT DO I NEED TO KNOW ABOUT THIS EATING PLAN? For the low-sodium eating plan, you will follow these general guidelines:  Choose foods with a % Daily Value for sodium of less than 5% (as listed on the food label).   Use salt-free seasonings or herbs instead of table salt or sea salt.    Check with your health care provider or pharmacist before using salt substitutes.   Eat fresh foods.  Eat more vegetables and fruits.  Limit canned vegetables. If you do use them, rinse them well to decrease the sodium.   Limit cheese to 1 oz (28 g) per day.   Eat lower-sodium products, often labeled as "lower sodium" or "no salt added."  Avoid foods that contain monosodium glutamate (MSG). MSG is sometimes added to [place] food and some canned foods.  Check food labels (Nutrition Facts labels) on foods to learn how much sodium is in one serving.  Eat more home-cooked food and less restaurant, buffet, and fast food.  When eating at a restaurant, ask that your food be prepared with less salt, or no salt if possible.  HOW DO I READ FOOD LABELS FOR SODIUM INFORMATION? The Nutrition Facts label lists the amount of sodium in one serving of the food. If you eat more than one serving, you must multiply the listed amount of sodium by the number of servings. Food labels may also identify foods as:  Sodium free--Less than 5 mg in a serving.  Very low sodium--35 mg or less in a serving.  Low sodium--140 mg or less in a serving.  Light in sodium--50% less sodium in a serving. For example, if a food that usually has 300 mg of sodium is changed to become light in sodium, it will have 150 mg of sodium.  Reduced sodium--25% less sodium in a serving. For example, if a food that usually has 400 mg of sodium is changed to reduced sodium, it will have 300 mg of sodium. WHAT FOODS CAN I EAT? Grains Low-sodium cereals, including oats, puffed wheat and rice, and shredded wheat cereals. Low-sodium crackers. Unsalted rice and pasta. Lower-sodium bread.  Vegetables Frozen or fresh vegetables. Low-sodium or reduced-sodium canned vegetables. Low-sodium or reduced-sodium tomato sauce and paste. Low-sodium or reduced-sodium tomato and vegetable juices.  Fruits Fresh, frozen, and canned fruit.  Fruit juice.  Meat and Other Protein Products Low-sodium canned tuna and salmon. Fresh or frozen meat, poultry, seafood, and fish. Lamb. Unsalted nuts. Dried beans, peas, and lentils without added salt. Unsalted canned beans. Homemade soups without salt. Eggs.  Dairy Milk. Soy milk. Ricotta cheese. Low-sodium or reduced-sodium cheeses. Yogurt.  Condiments Fresh and dried herbs and spices. Salt-free seasonings. Onion and garlic powders. Low-sodium varieties of mustard and ketchup. Fresh or  refrigerated horseradish. Lemon juice.  Fats and Oils Reduced-sodium salad dressings. Unsalted butter.  Other Unsalted popcorn and pretzels.  The items listed above may not be a complete list of recommended foods or beverages. Contact your dietitian for more options. WHAT FOODS ARE NOT RECOMMENDED? Grains Instant hot cereals. Bread stuffing, pancake, and biscuit mixes. Croutons. Seasoned rice or pasta mixes. Noodle soup cups. Boxed or frozen macaroni and cheese. Self-rising flour. Regular salted crackers. Vegetables Regular canned vegetables. Regular canned tomato sauce and paste. Regular tomato and vegetable juices. Frozen vegetables in sauces. Salted [place] fries. Olives. April Robinson. Relishes. Sauerkraut. Salsa. Meat and Other Protein Products Salted, canned, smoked, spiced, or pickled meats, seafood, or fish. Bacon, ham, sausage, hot dogs, corned beef, chipped beef, and packaged luncheon meats. Salt pork. Jerky. Pickled herring. Anchovies, regular canned tuna, and sardines. Salted nuts. Dairy Processed cheese and cheese spreads. Cheese curds. Blue cheese and cottage cheese. Buttermilk.  Condiments Onion and garlic salt, seasoned salt, table salt, and sea salt. Canned and packaged gravies. Worcestershire sauce. Tartar sauce. Barbecue sauce. Teriyaki sauce. Soy sauce, including reduced sodium. Steak sauce. Fish sauce. Oyster sauce. Cocktail sauce. Horseradish that you find on the shelf. Regular  ketchup and mustard. Meat flavorings and tenderizers. Bouillon cubes. Hot sauce. Tabasco sauce. Marinades. Taco seasonings. Relishes. Fats and Oils Regular salad dressings. Salted butter. Margarine. Ghee. Bacon fat.  Other Potato and tortilla chips. Corn chips and puffs. Salted popcorn and pretzels. Canned or dried soups. Pizza. Frozen entrees and pot pies.  The items listed above may not be a complete list of foods and beverages to avoid. Contact your dietitian for more information.   This information is not intended to replace advice given to you by your health care provider. Make sure you discuss any questions you have with your health care provider.     Dr Sharlett Iles at [place]  [phone]   Document Released: 04/03/2002 Document Revised: 11/02/2014 Document Reviewed: 08/16/2013 Elsevier Interactive Patient Education 2016 [employer].

## 2016-07-16 NOTE — Progress Notes (Signed)
April Robinson 1960-11-16 [id number]    History:    Presents for annual exam. 1993 LAVH for fibroids. 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 Jana Hakim. Continued hot flashes with Tamoxifen. Last mammogram 09/2015 benign. 2012 colonoscopy with tubular adenoma, has not had follow-up colonoscopy, due this year. 2014 normal DEXA. HSV occasional outbreaks, 2-3 times/year associated with stress. Shingles June 2017. History of GERD, followed by PCP, controlled with Dexilant. Was seen at PCP yesterday, diagnosed with allergic rhinitis and bronchitis, prescribed Zithromax, Albuterol, Flonase and Tessalon perles.   Past medical history, past surgical history, family history and social history were all reviewed and documented in the EPIC chart. Works in Engineer, technical sales, stressful at times. 3 daughters and 1 stepdaughter. Daughter and 2 grandchildren still living with her, stressful home situation.  Good relationship with husband. Mother, breast cancer in her 53's, currently with Alzheimer's, father is primary caregiver.   ROS:  A ROS was performed and pertinent positives and negatives are included.  Exam:  Vitals:   07/16/16 1626  BP: 132/80  Weight: 183 lb (83 kg)  Height: _0  (1.6 m)   Body mass index is 32.42 kg/m.   General appearance:  Normal Thyroid:  Symmetrical, normal in size, without palpable masses or nodularity. Respiratory  Auscultation:  Clear without wheezing or rhonchi Cardiovascular  Auscultation:  Regular rate, without rubs, murmurs or gallops  Edema/varicosities:  Not grossly evident Abdominal  Soft,nontender, without masses, guarding or rebound.  Liver/spleen:  No organomegaly noted  Hernia:  None appreciated  Skin  Inspection:  Grossly normal   Breasts: Examined lying and sitting.    Right: Dense, without masses, retractions, discharge or axillary  adenopathy.    Left: Dense, without masses, retractions, discharge or axillary adenopathy.   Lumpectomy scar noted. Gentitourinary   Inguinal/mons:  Normal without inguinal adenopathy  External genitalia:  Normal  BUS/Urethra/Skene's glands:  Normal  Vagina:  Normal  Cervix:  And uterus absent.   Adnexa/parametria:     Rt: Without masses or tenderness.   Lt: Without masses or tenderness.  Anus and perineum: Normal  Digital rectal exam: Normal sphincter tone without palpated masses or tenderness  Assessment/Plan:  55 y.o. MBF G3P3 for annual exam.  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 Magrinat 2012 tubular adenoma on colonoscopy Allergic rhinitis, bronchitis, GERD -- managed by PCP HSV -- occasional outbreaks  Plan: Encouraged to discuss alternative options to Tamoxifen with Dr. Jana Hakim if hot flashes are not manageable. Reviewed and gave information regarding BRCA testing. Continue SBE's, annual screening mammogram, vit. D supplement. Advised decreased calories and low-carb diet for weight loss. Advised to schedule follow-up colonoscopy this year, Dr. Buel Ream information provided. Discussed increasing leisure time for self and delegating home responsibilities. Continue basic labs at [facility]. Encouraged annual flu shot. Valtrex 500 mg BID PO for 3-5 days PRN. UAHuel Cote WHNP, 5:00 PM 07/16/2016

## 2016-07-17 LAB — URINALYSIS W MICROSCOPIC + REFLEX CULTURE
Bilirubin Urine: NEGATIVE
CASTS: NONE SEEN [LPF]
Glucose, UA: NEGATIVE
HGB URINE DIPSTICK: NEGATIVE
KETONES UR: NEGATIVE
Leukocytes, UA: NEGATIVE
NITRITE: NEGATIVE
PH: 5.5 (ref 5.0–8.0)
Protein, ur: NEGATIVE
SPECIFIC GRAVITY, URINE: 1.03 (ref 1.001–1.035)

## 2016-07-18 LAB — URINE CULTURE: ORGANISM ID, BACTERIA: NO GROWTH

## 2016-07-31 ENCOUNTER — Encounter: Payer: Self-pay | Admitting: Internal Medicine

## 2016-08-20 ENCOUNTER — Other Ambulatory Visit: Payer: Self-pay | Admitting: Family Medicine

## 2016-08-21 ENCOUNTER — Other Ambulatory Visit: Payer: 59

## 2016-08-24 ENCOUNTER — Encounter (HOSPITAL_COMMUNITY): Payer: Self-pay | Admitting: Emergency Medicine

## 2016-08-24 ENCOUNTER — Emergency Department (HOSPITAL_COMMUNITY)
Admission: EM | Admit: 2016-08-24 | Discharge: 2016-08-24 | Disposition: A | Discharge status: Home / Self Care | Payer: 59 | Attending: Emergency Medicine | Admitting: Emergency Medicine

## 2016-08-24 DIAGNOSIS — Z79899 Other long term (current) drug therapy: Secondary | ICD-10-CM | POA: Insufficient documentation

## 2016-08-24 DIAGNOSIS — S39012A Strain of muscle, fascia and tendon of lower back, initial encounter: Secondary | ICD-10-CM

## 2016-08-24 DIAGNOSIS — Y9389 Activity, other specified: Secondary | ICD-10-CM | POA: Insufficient documentation

## 2016-08-24 DIAGNOSIS — M545 Low back pain, unspecified: Secondary | ICD-10-CM

## 2016-08-24 DIAGNOSIS — Y999 Unspecified external cause status: Secondary | ICD-10-CM | POA: Diagnosis not present

## 2016-08-24 DIAGNOSIS — Z87891 Personal history of nicotine dependence: Secondary | ICD-10-CM | POA: Diagnosis not present

## 2016-08-24 DIAGNOSIS — I10 Essential (primary) hypertension: Secondary | ICD-10-CM | POA: Diagnosis not present

## 2016-08-24 DIAGNOSIS — X58XXXA Exposure to other specified factors, initial encounter: Secondary | ICD-10-CM | POA: Diagnosis not present

## 2016-08-24 DIAGNOSIS — F909 Attention-deficit hyperactivity disorder, unspecified type: Secondary | ICD-10-CM | POA: Insufficient documentation

## 2016-08-24 DIAGNOSIS — Y929 Unspecified place or not applicable: Secondary | ICD-10-CM | POA: Diagnosis not present

## 2016-08-24 DIAGNOSIS — Z7951 Long term (current) use of inhaled steroids: Secondary | ICD-10-CM | POA: Diagnosis not present

## 2016-08-24 DIAGNOSIS — S3992XA Unspecified injury of lower back, initial encounter: Secondary | ICD-10-CM | POA: Diagnosis present

## 2016-08-24 LAB — URINALYSIS, ROUTINE W REFLEX MICROSCOPIC
BILIRUBIN URINE: NEGATIVE
Glucose, UA: NEGATIVE mg/dL
Hgb urine dipstick: NEGATIVE
Ketones, ur: NEGATIVE mg/dL
Leukocytes, UA: NEGATIVE
NITRITE: NEGATIVE
Protein, ur: NEGATIVE mg/dL
SPECIFIC GRAVITY, URINE: 1.015 (ref 1.005–1.030)
pH: 7 (ref 5.0–8.0)

## 2016-08-24 MED ORDER — IBUPROFEN 800 MG PO TABS
800.0000 mg | ORAL_TABLET | Freq: Once | ORAL | Status: AC
  Administered 2016-08-24: 800 mg via ORAL
  Filled 2016-08-24: qty 1

## 2016-08-24 MED ORDER — DIAZEPAM 5 MG PO TABS: 2.5000 mg | ORAL_TABLET | Freq: Three times a day (TID) | ORAL | 0 refills | Status: DC | PRN

## 2016-08-24 MED ORDER — IBUPROFEN 800 MG PO TABS: 800.0000 mg | ORAL_TABLET | Freq: Three times a day (TID) | ORAL | 0 refills | Status: DC

## 2016-08-24 MED ORDER — DIAZEPAM 2 MG PO TABS
2.0000 mg | ORAL_TABLET | Freq: Once | ORAL | Status: AC
  Administered 2016-08-24: 2 mg via ORAL
  Filled 2016-08-24: qty 1

## 2016-08-24 NOTE — ED Notes (Signed)
Bed: [id number] Expected date:  Expected time:  Means of arrival:  Comments: EMS- 55yo F, muscle spasms in back

## 2016-08-24 NOTE — ED Provider Notes (Signed)
[place] DEPT Provider Note   CSN: [id number] Arrival date & time: 08/24/16  [id number]     History   Chief Complaint Chief Complaint  Patient presents with  . Back Pain    spasms    HPI April Robinson is a 55 y.o. female.   Back Pain   This is a new problem. The current episode started 1 to 2 hours ago. The problem occurs constantly. The problem has been gradually improving. The pain is associated with no known injury. Pain location: left middle lateral back pain. The quality of the pain is described as aching. The pain is mild. Pertinent negatives include no abdominal pain, no bowel incontinence, no perianal numbness, no bladder incontinence, no dysuria, no leg pain, no paresthesias, no paresis, no tingling and no weakness.    Past Medical History:  Diagnosis Date  . ADD (attention deficit disorder with hyperactivity)   . Alcoholism ([place])   . Arthritis   . Esophageal reflux   . Hiatal hernia   . Hx of radiation therapy 08/27/11 to 10/15/11   L breast  . Hyperlipemia   . Personal history of colonic polyps    TUBULAR ADENOMA    Patient Active Problem List   Diagnosis Date Noted  . Left knee pain 04/23/2015  . Knee pain, left 04/11/2015  . Popliteal pain 04/11/2015  . Hypertension 10/17/2014  . Acute pharyngitis 09/04/2014  . Left shoulder pain 02/19/2014  . Benign paroxysmal positional vertigo 09/29/2013  . Hot flashes due to tamoxifen 08/16/2013  . Hx of vaginitis 04/15/2012  . De Quervain's disease (tenosynovitis) 04/05/2012  . Breast cancer of upper-outer quadrant of left female breast ([place])   . IBS 12/03/2010  . ELEVATED BP READING WITHOUT DX HYPERTENSION 12/03/2010  . NECK SPRAIN AND STRAIN 12/03/2010  . ANXIETY STATE, UNSPECIFIED 11/07/2010  . ADD 11/07/2010  . GERD 11/07/2010  . CHEST PAIN UNSPECIFIED 11/07/2010    Past Surgical History:  Procedure Laterality Date  . ABDOMINAL HYSTERECTOMY    . BREAST LUMPECTOMY  07/23/11   left breast  .  LAPAROSCOPIC HYSTERECTOMY  1995    OB History    Gravida Para Term Preterm AB Living   3 3       3    SAB TAB Ectopic Multiple Live Births                   Home Medications    Prior to Admission medications   Medication Sig Start Date End Date Taking? Authorizing Provider  albuterol (PROVENTIL HFA;VENTOLIN HFA) 108 (90 Base) MCG/ACT inhaler Inhale 2 puffs into the lungs every 6 (six) hours as needed for wheezing or shortness of breath. 07/15/16  Yes Edward Saguier, PA-C  benzonatate (TESSALON) 100 MG capsule Take 1 capsule (100 mg total) by mouth 3 (three) times daily as needed for cough. 07/15/16  Yes Edward Saguier, PA-C  DEXILANT 60 MG capsule TAKE ONE CAPSULE BY MOUTH EVERY DAY 08/20/16  Yes Yvonne R Lowne Chase, DO  gabapentin (NEURONTIN) 100 MG capsule Take 1 capsule (100 mg total) by mouth at bedtime. 05/04/16  Yes Edward Saguier, PA-C  gabapentin (NEURONTIN) 300 MG capsule Take 300 mg by mouth at bedtime as needed.   Yes Historical Provider, MD  HYDROcodone-acetaminophen (NORCO) 5-325 MG tablet 1 tab po q 6 hours prn pain Patient taking differently: Take 1 tablet by mouth every 6 (six) hours as needed.  05/04/16  Yes Edward Saguier, PA-C  nystatin cream (MYCOSTATIN) Apply 1 application topically  2 (two) times daily. 04/23/16  Yes Edward Saguier, PA-C  tamoxifen (NOLVADEX) 20 MG tablet Take 1 tablet (20 mg total) by mouth daily. 11/13/15  Yes Chauncey Cruel, MD  valACYclovir (VALTREX) 500 MG tablet Take one tab twice daily for 3-5 days with outbreak. 07/16/16  Yes Huel Cote, NP  diazepam (VALIUM) 5 MG tablet Take 0.5 tablets (2.5 mg total) by mouth every 8 (eight) hours as needed for muscle spasms. 08/24/16   Merrily Pew, MD  fluticasone (FLONASE) 50 MCG/ACT nasal spray Place 2 sprays into both nostrils daily. 07/15/16   Percell Miller Saguier, PA-C  ibuprofen (ADVIL,MOTRIN) 800 MG tablet Take 1 tablet (800 mg total) by mouth 3 (three) times daily. 08/24/16   Merrily Pew, MD    Family  History Family History  Problem Relation Age of Onset  . Breast cancer Mother   . Cancer Mother     breast  . Diabetes Father   . Hypertension Father   . Cancer Brother     NEUROENDOCRINE  . Hypertension Brother   . Arthritis Brother     hips  . Colon cancer Neg Hx     Social History Social History  Substance Use Topics  . Smoking status: Former Research scientist (life sciences)  . Smokeless tobacco: Never Used  . Alcohol use No     Allergies   Meloxicam; Latex; and Miconazole nitrate   Review of Systems Review of Systems  HENT: Negative for drooling and ear discharge.   Respiratory: Negative for cough and shortness of breath.   Gastrointestinal: Negative for abdominal pain and bowel incontinence.  Genitourinary: Negative for bladder incontinence, dysuria and flank pain.  Musculoskeletal: Positive for back pain.  Neurological: Negative for tingling, weakness and paresthesias.  All other systems reviewed and are negative.    Physical Exam Updated Vital Signs BP 133/73   Pulse 79   Temp 97.5 F (36.4 C) (Oral)   Resp 18   Wt 183 lb (83 kg)   SpO2 99%   BMI 32.42 kg/m   Physical Exam  Constitutional: She is oriented to person, place, and time. She appears well-developed and well-nourished.  HENT:  Head: Normocephalic and atraumatic.  Neck: Normal range of motion.  Cardiovascular: Normal rate and regular rhythm.   Pulmonary/Chest: No stridor. No respiratory distress.  Abdominal: Soft. She exhibits no distension.  Musculoskeletal: She exhibits tenderness (paraspinal around T8-10 range).  Neurological: She is alert and oriented to person, place, and time. She has normal reflexes. She displays normal reflexes. No cranial nerve deficit. She exhibits normal muscle tone. Coordination normal.  Skin: Skin is warm and dry.  Nursing note and vitals reviewed.    ED Treatments / Results  Labs (all labs ordered are listed, but only abnormal results are displayed) Labs Reviewed    URINALYSIS, ROUTINE W REFLEX MICROSCOPIC (NOT AT [facility])    EKG  EKG Interpretation None       Radiology No results found.  Procedures Procedures (including critical care time)  Medications Ordered in ED Medications  diazepam (VALIUM) tablet 2 mg (2 mg Oral Given 08/24/16 1052)  ibuprofen (ADVIL,MOTRIN) tablet 800 mg (800 mg Oral Given 08/24/16 1052)     Initial Impression / Assessment and Plan / ED Course  I have reviewed the triage vital signs and the nursing notes.  Pertinent labs & imaging results that were available during my care of the patient were reviewed by me and considered in my medical decision making (see chart for details).  Clinical Course  Likely MSK but will check urine and treat pain.  Urine negative, symptoms greatly improved with ED tx. Able to ambulate. Doubt acute injury. Likely muscular and will suggest outpatient treatment based on the same. No indication for imaging at this time.   Final Clinical Impressions(s) / ED Diagnoses   Final diagnoses:  Strain of lumbar region, initial encounter  Acute left-sided low back pain without sciatica    New Prescriptions Discharge Medication List as of 08/24/2016 12:39 PM    START taking these medications   Details  diazepam (VALIUM) 5 MG tablet Take 0.5 tablets (2.5 mg total) by mouth every 8 (eight) hours as needed for muscle spasms., Starting Mon 08/24/2016, Print    ibuprofen (ADVIL,MOTRIN) 800 MG tablet Take 1 tablet (800 mg total) by mouth 3 (three) times daily., Starting Mon 08/24/2016, Print         Merrily Pew, MD 08/24/16 [phone]

## 2016-08-24 NOTE — ED Triage Notes (Signed)
Reports she hasn't experienced this type of spasm before, but states she does experience spasms under her left breast about once a month, states it can last up to 5 minutes

## 2016-08-24 NOTE — ED Triage Notes (Signed)
Reports was reaching toward her dresser from bed for a piece of paper and felt her back start to spasm. States was able to ambulate to the restroom, with excruciating pain. States it hurts left side moving towards her back. States it feels like a leg cramp x10 in her back. States it feels like her back is tightening.

## 2016-08-28 ENCOUNTER — Ambulatory Visit: Payer: 59 | Admitting: Nurse Practitioner

## 2016-09-03 ENCOUNTER — Other Ambulatory Visit (HOSPITAL_BASED_OUTPATIENT_CLINIC_OR_DEPARTMENT_OTHER): Payer: 59

## 2016-09-03 DIAGNOSIS — E669 Obesity, unspecified: Secondary | ICD-10-CM

## 2016-09-03 DIAGNOSIS — C50912 Malignant neoplasm of unspecified site of left female breast: Secondary | ICD-10-CM

## 2016-09-03 DIAGNOSIS — C50412 Malignant neoplasm of upper-outer quadrant of left female breast: Secondary | ICD-10-CM

## 2016-09-03 LAB — COMPREHENSIVE METABOLIC PANEL
ALT: 22 U/L (ref 0–55)
AST: 21 U/L (ref 5–34)
Albumin: 3.6 g/dL (ref 3.5–5.0)
Alkaline Phosphatase: 67 U/L (ref 40–150)
Anion Gap: 9 mEq/L (ref 3–11)
BUN: 14.6 mg/dL (ref 7.0–26.0)
CHLORIDE: 108 meq/L (ref 98–109)
CO2: 26 meq/L (ref 22–29)
Calcium: 9.3 mg/dL (ref 8.4–10.4)
Creatinine: 1.1 mg/dL (ref 0.6–1.1)
EGFR: 68 mL/min/{1.73_m2} — AB (ref >90)
GLUCOSE: 96 mg/dL (ref 70–140)
POTASSIUM: 4 meq/L (ref 3.5–5.1)
SODIUM: 143 meq/L (ref 136–145)
Total Bilirubin: 0.51 mg/dL (ref 0.20–1.20)
Total Protein: 7.1 g/dL (ref 6.4–8.3)

## 2016-09-03 LAB — CBC WITH DIFFERENTIAL/PLATELET
BASO%: 0.6 % (ref 0.0–2.0)
BASOS ABS: 0 10*3/uL (ref 0.0–0.1)
EOS ABS: 0.1 10*3/uL (ref 0.0–0.5)
EOS%: 1.3 % (ref 0.0–7.0)
HCT: 42.2 % (ref 34.8–46.6)
HGB: 13.8 g/dL (ref 11.6–15.9)
LYMPH%: 31.8 % (ref 14.0–49.7)
MCH: 28.5 pg (ref 25.1–34.0)
MCHC: 32.7 g/dL (ref 31.5–36.0)
MCV: 87.1 fL (ref 79.5–101.0)
MONO#: 0.3 10*3/uL (ref 0.1–0.9)
MONO%: 6.3 % (ref 0.0–14.0)
NEUT%: 60 % (ref 38.4–76.8)
NEUTROS ABS: 3.2 10*3/uL (ref 1.5–6.5)
Platelets: 214 10*3/uL (ref 145–400)
RBC: 4.85 10*6/uL (ref 3.70–5.45)
RDW: 13.9 % (ref 11.2–14.5)
WBC: 5.3 10*3/uL (ref 3.9–10.3)
lymph#: 1.7 10*3/uL (ref 0.9–3.3)

## 2016-09-07 ENCOUNTER — Encounter: Payer: Self-pay | Admitting: Oncology

## 2016-09-07 ENCOUNTER — Ambulatory Visit (HOSPITAL_BASED_OUTPATIENT_CLINIC_OR_DEPARTMENT_OTHER): Payer: 59 | Admitting: Oncology

## 2016-09-07 VITALS — BP 142/81 | HR 81 | Temp 97.7°F | Resp 18 | Wt 182.9 lb

## 2016-09-07 DIAGNOSIS — C50912 Malignant neoplasm of unspecified site of left female breast: Secondary | ICD-10-CM

## 2016-09-07 DIAGNOSIS — Z79810 Long term (current) use of selective estrogen receptor modulators (SERMs): Secondary | ICD-10-CM | POA: Diagnosis not present

## 2016-09-07 DIAGNOSIS — Z170 Estrogen receptor positive status [ER+]: Secondary | ICD-10-CM | POA: Diagnosis not present

## 2016-09-07 DIAGNOSIS — C50412 Malignant neoplasm of upper-outer quadrant of left female breast: Secondary | ICD-10-CM

## 2016-09-07 DIAGNOSIS — Z23 Encounter for immunization: Secondary | ICD-10-CM

## 2016-09-07 MED ORDER — INFLUENZA VAC SPLIT QUAD 0.5 ML IM SUSY
0.5000 mL | PREFILLED_SYRINGE | Freq: Once | INTRAMUSCULAR | Status: AC
  Administered 2016-09-07: 0.5 mL via INTRAMUSCULAR
  Filled 2016-09-07: qty 0.5

## 2016-09-07 MED ORDER — TAMOXIFEN CITRATE 20 MG PO TABS: 20.0000 mg | ORAL_TABLET | Freq: Every day | ORAL | 3 refills | Status: DC

## 2016-09-07 NOTE — Progress Notes (Signed)
ID: April Robinson   DOB: [birth year]  MR#: [id number]  VQM#:[id number]  PCP: Ann Held, DO GYN: Uvaldo Rising, MD SU: Stark Klein, MD OTHER MD: Kyung Rudd, MD;  Verl Blalock, MD  CHIEF COMPLAINT:  Hx of Left Breast Cancer  CURRENT TREATMENT: tamoxifen   BREAST CANCER HISTORY: From the original intake note:  The patient had a questionable palpable finding in the left breast which was evaluated in July 2009 and it was recommended that she return for further evaluation but actually did not show.  She had bilateral diagnostic mammography in March 2011 which showed minimal duct ectasia of the left breast at the location in question.  Again, 57-monthinterval mammography was recommended and this was repeated August 2012.  There was minimally a more prominent ductal ectasia and MRI was recommended for further evaluation.  This was performed on June 22, 2011, and it showed a 1.2 cm enhancing mass in the posterior aspect of an area of irregular linear enhancement.  Anteriorly to this, there was a 6 mm area which is the area of dilated duct noted by prior evaluation.   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 The tumor was estrogen receptor positive at 72%, progesterone receptor positive at 5%, with a very low MIB1 at 4%.  HER2 was not amplified with a ratio by CISH of 1.10.   The patient's subsequent history is as detailed below.  INTERVAL HISTORY:  [name] today for follow-up of her estrogen receptor positive breast cancer. She continues on tamoxifen, which she tolerates well. The main issue is hot flashes. They do wake her up at night. They also occur during the day. Uses gabapentin on a PRN basis which is working for her. She does not have a vaginal wetness problem from this drug. She obtains it  at a good price.  REVIEW OF SYSTEMS:  PMazalis a little stressed because her daughter has been living in her home now for 4 years, with 2 children, the younger being 42years old. Her daughter is getting a second job and will eventually become more independent. She is also dealing with her mother who has Alzheimer's Disease. Currently her only complaint aside from hot flashes is a little bit of discomfort around her knees. She exercises by doing line dancing. Requests flu vaccine today. A detailed review of systems today was otherwise stable   PAST MEDICAL HISTORY: Past Medical History:  Diagnosis Date  . ADD (attention deficit disorder with hyperactivity)   . [place]   . Arthritis   . Esophageal reflux   . Hiatal hernia   . Hx of radiation therapy 08/27/11 to 10/15/11   L breast  . Hyperlipemia   . Personal history of colonic polyps    TUBULAR ADENOMA    PAST SURGICAL HISTORY: Past Surgical History:  Procedure Laterality Date  . ABDOMINAL HYSTERECTOMY    . BREAST LUMPECTOMY  07/23/11   left breast  . LAPAROSCOPIC HYSTERECTOMY  1995  no BSO, still has both ovaries  FAMILY HISTORY Family History  Problem Relation Age of Onset  . Breast cancer Mother   . Cancer Mother     breast  . Diabetes Father   . Hypertension Father   . Cancer Brother     NEUROENDOCRINE  . Hypertension Brother   .  Arthritis Brother     hips  . Colon cancer Neg Hx   The patient's father is alive at age 65.  The patient's mother is alive at age 6.  She was diagnosed with breast cancer at age 60.  The patient had 2 brothers; one of them died from a neuroendocrine tumor.  She has 1 sister.  There is no other breast or ovarian cancer in the immediate family.  GYNECOLOGIC HISTORY:  (Reviewed 02/19/2014) She had menarche, age 41. She is GX P3 with first live birth at age 23. She had a hysterectomy in 1993, but did not have her ovaries removed.  She took hormone replacement for at least 2 years.     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  Her husband, [name], works as a Administrator.  The patient's children from a prior marriage are:  [name], 34, who lives in [place] and works as a Production assistant, radio.  [name] who works as a Pharmacist, hospital of ages between 56 and 12; she is also a Presenter, broadcasting.  [name], 29, who studies accounting at [employer]. The patient has 8 grandchildren. She is very active in her local church    ADVANCED DIRECTIVES: Not in place  HEALTH MAINTENANCE:  (Updated 04/27/215) Social History  Substance Use Topics  . Smoking status: Former Research scientist (life sciences)  . Smokeless tobacco: Never Used  . Alcohol use No     Colonoscopy: March 2012, Dr. Sharlett Iles  PAP:  [facility], Dr. Toney Rakes  Bone density:  09/18/2013, normal  Lipid panel:  [facility], Dr. Etter Sjogren   Allergies  Allergen Reactions  . Meloxicam Shortness Of Breath and Nausea Only  . Miconazole Nitrate Other (See Comments)    SWELLING OF SOFT PALATE THAT CAUSED DIFFICULTY SWALLOWING  . Latex Itching, Rash and Other (See Comments)    Fine, little bumps    Current Outpatient Prescriptions  Medication Sig Dispense Refill  . DEXILANT 60 MG capsule TAKE ONE CAPSULE BY MOUTH EVERY DAY 30 capsule 3  . tamoxifen (NOLVADEX) 20 MG tablet Take 1 tablet (20 mg total) by mouth daily. 90 tablet 3  . valACYclovir (VALTREX) 500 MG tablet Take one tab twice daily for 3-5 days with outbreak. 30 tablet 6  . diazepam (VALIUM) 5 MG tablet Take 0.5 tablets (2.5 mg total) by mouth every 8 (eight) hours as needed for muscle spasms. (Patient not taking: Reported on 09/07/2016) 10 tablet 0  . fluticasone (FLONASE) 50 MCG/ACT nasal spray Place 2 sprays into both nostrils daily. (Patient not taking: Reported on 09/07/2016) 16 g 1  . gabapentin (NEURONTIN) 100 MG capsule Take 1 capsule (100 mg total)  by mouth at bedtime. (Patient not taking: Reported on 09/07/2016) 30 capsule 3  . gabapentin (NEURONTIN) 300 MG capsule Take 300 mg by mouth at bedtime as needed.    Marland Kitchen HYDROcodone-acetaminophen (NORCO) 5-325 MG tablet 1 tab po q 6 hours prn pain (Patient not taking: Reported on 09/07/2016) 14 tablet 0  . ibuprofen (ADVIL,MOTRIN) 800 MG tablet Take 1 tablet (800 mg total) by mouth 3 (three) times daily. (Patient not taking: Reported on 09/07/2016) 21 tablet 0  . nystatin cream (MYCOSTATIN) Apply 1 application topically 2 (two) times daily. (Patient not taking: Reported on 09/07/2016) 30 g 0   No current facility-administered medications for this visit.     OBJECTIVE: Middle-aged [place] American woman  in no acute distress Vitals:   09/07/16 1537  BP: (!) 142/81  Pulse: 81  Resp: 18  Temp: 97.7 F (36.5 C)     Body mass index is 32.4 kg/m.    ECOG FS: 0 Filed Weights   09/07/16 1537  Weight: 182 lb 14.4 oz (83 kg)   Sclerae unicteric, EOMs intact Oropharynx clear, dentition in good repair No cervical or supraclavicular adenopathy Lungs no rales or rhonchi Heart regular rate and rhythm Abd soft, nontender, positive bowel sounds MSK no focal spinal tenderness, no upper extremity lymphedema Neuro: nonfocal, well oriented, appropriate affect Breasts: The right breast is unremarkable per the left breast is status post lumpectomy and radiation. There is no evidence of local recurrence. Left axilla is benign.   LAB RESULTS: Lab Results  Component Value Date   WBC 5.3 09/03/2016   NEUTROABS 3.2 09/03/2016   HGB 13.8 09/03/2016   HCT 42.2 09/03/2016   MCV 87.1 09/03/2016   PLT 214 09/03/2016      Chemistry      Component Value Date/Time   NA 143 09/03/2016 0830   K 4.0 09/03/2016 0830   CL 107 04/24/2016 1025   CL 107 01/30/2013 1509   CO2 26 09/03/2016 0830   BUN 14.6 09/03/2016 0830   CREATININE 1.1 09/03/2016 0830      Component Value Date/Time   CALCIUM 9.3  09/03/2016 0830   ALKPHOS 67 09/03/2016 0830   AST 21 09/03/2016 0830   ALT 22 09/03/2016 0830   BILITOT 0.51 09/03/2016 0830        STUDIES: CLINICAL DATA: History of left breast cancer status post lumpectomy 2012.  EXAM: DIGITAL DIAGNOSTIC BILATERAL MAMMOGRAM WITH 3D TOMOSYNTHESIS AND CAD  COMPARISON: Previous exam(s).  ACR Breast Density Category b: There are scattered areas of fibroglandular density.  FINDINGS: Stable lumpectomy changes are seen of the left breast. There is no suspicious mass or malignant type microcalcifications.  Mammographic images were processed with CAD.  IMPRESSION: No evidence of malignancy in either breast.  RECOMMENDATION: Bilateral diagnostic mammogram in 1 year is recommended.  I have discussed the findings and recommendations with the patient. Results were also provided in writing at the conclusion of the visit. If applicable, a reminder letter will be sent to the patient regarding the next appointment.  BI-RADS CATEGORY 2: Benign.   Electronically Signed  By: Lillia Mountain M.D.  On: 10/04/2015 13:33   ASSESSMENT: 55 y.o.  [place] woman   (1)  status post left lumpectomy and sentinel lymph node biopsy September of 2012 for a T1c N0, Stage IA  invasive ductal carcinoma, grade 2, estrogen and progesterone receptor positive with a very low MIB-1 and no HER-2 amplification.   (2)  She completed adjuvant radiation 10/15/2011   (3) started tamoxifen 10/27/2011, the plan being to continue for total of 10 years until 2023.  (4) s/p remote TAH, with ovaries still intact    PLAN:  Tea is now 5 years out from her definitive surgery with no evidence of recurrent breast cancer. This is very favorable.  She is tolerating tamoxifen well, with the exception of the hot flashes. The plan is to continue tamoxifen for total of 10 years. Continue the gabapentin for hot flashes.  I have placed an order for her mammogram in  09/2016.  Flu vaccine given today.  Follow-up in 1 year or sooner if needed. She knows to call for any problems that may develop before that visit.Mikey Bussing, NP  09/07/2016

## 2016-09-07 NOTE — Patient Instructions (Signed)

## 2016-09-10 ENCOUNTER — Ambulatory Visit (AMBULATORY_SURGERY_CENTER): Payer: Self-pay | Admitting: *Deleted

## 2016-09-10 VITALS — Ht 63.0 in | Wt 184.0 lb

## 2016-09-10 DIAGNOSIS — Z86010 Personal history of colonic polyps: Secondary | ICD-10-CM

## 2016-09-10 MED ORDER — NA SULFATE-K SULFATE-MG SULF 17.5-3.13-1.6 GM/177ML PO SOLN: 1.0000 | Freq: Once | ORAL | 0 refills | Status: AC

## 2016-09-10 NOTE — Progress Notes (Signed)
No egg or soy allergy. No anesthesia problems.  No home O2.  No diet meds.  

## 2016-09-18 ENCOUNTER — Telehealth: Payer: Self-pay | Admitting: Oncology

## 2016-09-18 NOTE — Telephone Encounter (Signed)
Spoke with patient re November 2018 lab/fu - patient to schedule mammo.

## 2016-09-19 ENCOUNTER — Ambulatory Visit (INDEPENDENT_AMBULATORY_CARE_PROVIDER_SITE_OTHER): Payer: 59 | Admitting: Family Medicine

## 2016-09-19 VITALS — BP 128/84 | HR 88 | Temp 99.3°F | Resp 16 | Ht 63.0 in | Wt 184.0 lb

## 2016-09-19 DIAGNOSIS — R05 Cough: Secondary | ICD-10-CM | POA: Diagnosis not present

## 2016-09-19 DIAGNOSIS — J069 Acute upper respiratory infection, unspecified: Secondary | ICD-10-CM | POA: Diagnosis not present

## 2016-09-19 DIAGNOSIS — R059 Cough, unspecified: Secondary | ICD-10-CM

## 2016-09-19 MED ORDER — BENZONATATE 100 MG PO CAPS: 100.0000 mg | ORAL_CAPSULE | Freq: Three times a day (TID) | ORAL | 0 refills | Status: DC | PRN

## 2016-09-19 MED ORDER — HYDROCODONE-HOMATROPINE 5-1.5 MG/5ML PO SYRP: 5.0000 mL | ORAL_SOLUTION | ORAL | 0 refills | Status: DC | PRN

## 2016-09-19 MED ORDER — FLUTICASONE PROPIONATE 50 MCG/ACT NA SUSP: 2.0000 | Freq: Every day | NASAL | 0 refills | Status: DC

## 2016-09-19 MED ORDER — AMOXICILLIN 875 MG PO TABS: 875.0000 mg | ORAL_TABLET | Freq: Two times a day (BID) | ORAL | 0 refills | Status: DC

## 2016-09-19 NOTE — Progress Notes (Signed)
Patient ID: April Robinson, female    DOB: April 20, 1961  Age: 55 y.o. MRN: [id number]  Chief Complaint  Patient presents with  . Cough  . Nasal Congestion  . Fever    Subjective:   55 year old lady who is been ill for the past 6 days or so with a upper respiratory infection. She had some sore throat initially. She has a lot of head congestion. She has a lot of postnasal drainage and persistent cough. She couldn't sleep because of the cough. She has had some hot and cold, no documented fevers.  She has been treated for a breast cancer, had radiation but no chemotherapy. Is doing well at this point. It is been a while since she has been here.  Current allergies, medications, problem list, past/family and social histories reviewed.  Objective:  BP 128/84   Pulse 88   Temp 99.3 F (37.4 C) (Oral)   Resp 16   Ht 5\' 3"  (1.6 m)   Wt 184 lb (83.5 kg)   SpO2 97%   BMI 32.59 kg/m   Borderline temperature elevation is noted. TMs are normal. Throat not erythematous. Neck supple without significant nodes. Chest has good air exchange and no rales or rhonchi or wheezes. Heart regular without murmur.  Assessment & Plan:   Assessment: 1. Cough   2. Acute upper respiratory infection       Plan: See instructions  No orders of the defined types were placed in this encounter.   Meds ordered this encounter  Medications  . amoxicillin (AMOXIL) 875 MG tablet    Sig: Take 1 tablet (875 mg total) by mouth 2 (two) times daily.    Dispense:  20 tablet    Refill:  0  . HYDROcodone-homatropine (HYCODAN) 5-1.5 MG/5ML syrup    Sig: Take 5 mLs by mouth every 4 (four) hours as needed.    Dispense:  120 mL    Refill:  0  . benzonatate (TESSALON) 100 MG capsule    Sig: Take 1-2 capsules (100-200 mg total) by mouth 3 (three) times daily as needed.    Dispense:  30 capsule    Refill:  0  . fluticasone (FLONASE) 50 MCG/ACT nasal spray    Sig: Place 2 sprays into both nostrils daily.    Dispense:   16 g    Refill:  0         Patient Instructions   Drink plenty of fluids and get enough rest  Continue taking the Zyrtec (cetirizine) one daily  1 teaspoon every 4-6 hours as needed for cough. This will make you drowsy, but is best for use at nighttime.  In the daytime or when you return to work take the benzonatate cough pills one or 2 pills 3 times daily as necessary  Take amoxicillin 875 mg one twice daily for infection  Use fluticasone (Flonase) nose spray 2 sprays each nostril twice daily for 3 days, then once daily  IF you received an x-ray today, you will receive an invoice from [facility]. Please contact [facility] at [phone] with questions or concerns regarding your invoice.   IF you received labwork today, you will receive an invoice from [facility]. Please contact [facility] at [phone] with questions or concerns regarding your invoice.   Our billing staff will not be able to assist you with questions regarding bills from these companies.  You will be contacted with the lab results as soon as they are available. The fastest way  to get your results is to activate your My Chart account. Instructions are located on the last page of this paperwork. If you have not heard from [place] regarding the results in 2 weeks, please contact this office.         No Follow-up on file.   HOPPER,DAVID, MD 09/19/2016

## 2016-09-19 NOTE — Patient Instructions (Addendum)
Drink plenty of fluids and get enough rest  Continue taking the Zyrtec (cetirizine) one daily  1 teaspoon every 4-6 hours as needed for cough. This will make you drowsy, but is best for use at nighttime.  In the daytime or when you return to work take the benzonatate cough pills one or 2 pills 3 times daily as necessary  Take amoxicillin 875 mg one twice daily for infection  Use fluticasone (Flonase) nose spray 2 sprays each nostril twice daily for 3 days, then once daily  IF you received an x-ray today, you will receive an invoice from [facility]. Please contact [facility] at [phone] with questions or concerns regarding your invoice.   IF you received labwork today, you will receive an invoice from [facility] [employer]. Please contact [facility] at [phone] with questions or concerns regarding your invoice.   Our billing staff will not be able to assist you with questions regarding bills from these companies.  You will be contacted with the lab results as soon as they are available. The fastest way to get your results is to activate your My Chart account. Instructions are located on the last page of this paperwork. If you have not heard from [place] regarding the results in 2 weeks, please contact this office.

## 2016-09-21 ENCOUNTER — Telehealth: Payer: Self-pay | Admitting: *Deleted

## 2016-09-21 NOTE — Telephone Encounter (Signed)
[facility] note received via fax  Call:   Date: 09/19/16 Time: 1613   Caller: Self Return number: [phone]  Nurse: Gabriel Earing, RN  Chief Complaint: Cough  Reason for call: Caller stated she is having drainage that is clear with dark yellow spots, coughing especially at night, having hard time sleeping, scratchy throat, on and off fever, and stuffy nose.   Related visit to physician within the last 2 weeks: N/A  Guideline: N/A  Disposition: Clinical Call  **Pt seen at [facility] same day**

## 2016-09-24 ENCOUNTER — Encounter: Payer: Self-pay | Admitting: Internal Medicine

## 2016-10-01 ENCOUNTER — Other Ambulatory Visit: Payer: Self-pay | Admitting: Family Medicine

## 2016-10-02 ENCOUNTER — Ambulatory Visit (AMBULATORY_SURGERY_CENTER): Payer: 59 | Admitting: Internal Medicine

## 2016-10-02 ENCOUNTER — Encounter: Payer: Self-pay | Admitting: Internal Medicine

## 2016-10-02 VITALS — BP 120/64 | HR 78 | Temp 97.7°F | Resp 16 | Ht 63.0 in | Wt 184.0 lb

## 2016-10-02 DIAGNOSIS — Z86010 Personal history of colonic polyps: Secondary | ICD-10-CM

## 2016-10-02 DIAGNOSIS — D123 Benign neoplasm of transverse colon: Secondary | ICD-10-CM

## 2016-10-02 MED ORDER — SODIUM CHLORIDE 0.9 % IV SOLN: 500.0000 mL | INTRAVENOUS | Status: DC

## 2016-10-02 NOTE — Patient Instructions (Signed)
Handout given ; Polyps  YOU HAD AN ENDOSCOPIC PROCEDURE TODAY AT THE [facility]:   Refer to the procedure report that was given to you for any specific questions about what was found during the examination.  If the procedure report does not answer your questions, please call your gastroenterologist to clarify.  If you requested that your care partner not be given the details of your procedure findings, then the procedure report has been included in a sealed envelope for you to review at your convenience later.  YOU SHOULD EXPECT: Some feelings of bloating in the abdomen. Passage of more gas than usual.  Walking can help get rid of the air that was put into your GI tract during the procedure and reduce the bloating. If you had a lower endoscopy (such as a colonoscopy or flexible sigmoidoscopy) you may notice spotting of blood in your stool or on the toilet paper. If you underwent a bowel prep for your procedure, you may not have a normal bowel movement for a few days.  Please Note:  You might notice some irritation and congestion in your nose or some drainage.  This is from the oxygen used during your procedure.  There is no need for concern and it should clear up in a day or so.  SYMPTOMS TO REPORT IMMEDIATELY:   Following lower endoscopy (colonoscopy or flexible sigmoidoscopy):  Excessive amounts of blood in the stool  Significant tenderness or worsening of abdominal pains  Swelling of the abdomen that is new, acute  Fever of 100F or higher  For urgent or emergent issues, a gastroenterologist can be reached at any hour by calling [phone].   DIET:  We do recommend a small meal at first, but then you may proceed to your regular diet.  Drink plenty of fluids but you should avoid alcoholic beverages for 24 hours.  ACTIVITY:  You should plan to take it easy for the rest of today and you should NOT DRIVE or use heavy machinery until tomorrow (because of the sedation medicines  used during the test).    FOLLOW UP: Our staff will call the number listed on your records the next business day following your procedure to check on you and address any questions or concerns that you may have regarding the information given to you following your procedure. If we do not reach you, we will leave a message.  However, if you are feeling well and you are not experiencing any problems, there is no need to return our call.  We will assume that you have returned to your regular daily activities without incident.  If any biopsies were taken you will be contacted by phone or by letter within the next 1-3 weeks.  Please call us at [phone] if you have not heard about the biopsies in 3 weeks.    SIGNATURES/CONFIDENTIALITY: You and/or your care partner have signed paperwork which will be entered into your electronic medical record.  These signatures attest to the fact that that the information above on your After Visit Summary has been reviewed and is understood.  Full responsibility of the confidentiality of this discharge information lies with you and/or your care-partner. 

## 2016-10-02 NOTE — Op Note (Signed)
[place] Patient Name: April Robinson Procedure Date: 10/02/2016 11:06 AM MRN: [id number] Endoscopist: Jerene Bears , MD Age: 55 Referring MD:  Date of Birth: 1961/06/20 Gender: Female Account #: [id number] Procedure:                Colonoscopy Indications:              Surveillance: Personal history of adenomatous                            polyps on last colonoscopy 5 years ago Medicines:                Monitored Anesthesia Care Procedure:                Pre-Anesthesia Assessment:                           - Prior to the procedure, a History and Physical                            was performed, and patient medications and                            allergies were reviewed. The patient's tolerance of                            previous anesthesia was also reviewed. The risks                            and benefits of the procedure and the sedation                            options and risks were discussed with the patient.                            All questions were answered, and informed consent                            was obtained. Prior Anticoagulants: The patient has                            taken no previous anticoagulant or antiplatelet                            agents. ASA Grade Assessment: III - A patient with                            severe systemic disease. After reviewing the risks                            and benefits, the patient was deemed in                            satisfactory condition to undergo the procedure.  After obtaining informed consent, the colonoscope                            was passed under direct vision. Throughout the                            procedure, the patient's blood pressure, pulse, and                            oxygen saturations were monitored continuously. The                            Model PCF-H190L [id number]) scope was introduced                            through the anus and  advanced to the the cecum,                            identified by appendiceal orifice and ileocecal                            valve. The colonoscopy was performed without                            difficulty. The patient tolerated the procedure                            well. The quality of the bowel preparation was                            good. The ileocecal valve, appendiceal orifice, and                            rectum were photographed. Scope In: 11:14:00 AM Scope Out: 11:25:12 AM Scope Withdrawal Time: 0 hours 9 minutes 16 seconds  Total Procedure Duration: 0 hours 11 minutes 12 seconds  Findings:                 The perianal and digital rectal examinations were                            normal.                           A 5 mm polyp was found in the transverse colon. The                            polyp was sessile. The polyp was removed with a                            cold snare. Resection and retrieval were complete.                           The exam was otherwise without abnormality on  direct and retroflexion views. Complications:            No immediate complications. Estimated Blood Loss:     Estimated blood loss was minimal. Impression:               - One 5 mm polyp in the transverse colon, removed                            with a cold snare. Resected and retrieved.                           - The examination was otherwise normal on direct                            and retroflexion views. Recommendation:           - Patient has a contact number available for                            emergencies. The signs and symptoms of potential                            delayed complications were discussed with the                            patient. Return to normal activities tomorrow.                            Written discharge instructions were provided to the                            patient.                           - Resume  previous diet.                           - Continue present medications.                           - Await pathology results.                           - Repeat colonoscopy is recommended for                            surveillance. The colonoscopy date will be                            determined after pathology results from today's                            exam become available for review. Jerene Bears, MD 10/02/2016 11:31:34 AM This report has been signed electronically.

## 2016-10-05 ENCOUNTER — Telehealth: Payer: Self-pay | Admitting: *Deleted

## 2016-10-05 NOTE — Telephone Encounter (Signed)
  Follow up Call-  Call back number 10/02/2016  Post procedure Call Back phone  # [phone]  Permission to leave phone message Yes  Some recent data might be hidden     Patient questions:  Do you have a fever, pain , or abdominal swelling? No. Pain Score  0 *  Have you tolerated food without any problems? Yes.    Have you been able to return to your normal activities? Yes.    Do you have any questions about your discharge instructions: Diet   No. Medications  No. Follow up visit  No.  Do you have questions or concerns about your Care? No.  Actions: * If pain score is 4 or above: No action needed, pain <4.

## 2016-10-09 ENCOUNTER — Encounter: Payer: Self-pay | Admitting: Internal Medicine

## 2017-01-18 ENCOUNTER — Other Ambulatory Visit: Payer: Self-pay | Admitting: *Deleted

## 2017-01-18 MED ORDER — TAMOXIFEN CITRATE 20 MG PO TABS: 20.0000 mg | ORAL_TABLET | Freq: Every day | ORAL | 3 refills | Status: DC

## 2017-01-29 ENCOUNTER — Ambulatory Visit
Admission: RE | Admit: 2017-01-29 | Discharge: 2017-01-29 | Disposition: A | Discharge status: Home / Self Care | Payer: 59 | Source: Ambulatory Visit | Attending: Oncology | Admitting: Oncology

## 2017-01-29 DIAGNOSIS — Z170 Estrogen receptor positive status [ER+]: Secondary | ICD-10-CM

## 2017-01-29 DIAGNOSIS — C50412 Malignant neoplasm of upper-outer quadrant of left female breast: Secondary | ICD-10-CM

## 2017-01-29 DIAGNOSIS — R928 Other abnormal and inconclusive findings on diagnostic imaging of breast: Secondary | ICD-10-CM | POA: Diagnosis not present

## 2017-02-27 ENCOUNTER — Encounter (HOSPITAL_COMMUNITY): Payer: Self-pay | Admitting: Emergency Medicine

## 2017-02-27 ENCOUNTER — Emergency Department (HOSPITAL_COMMUNITY)
Admission: EM | Admit: 2017-02-27 | Discharge: 2017-02-27 | Disposition: A | Discharge status: Home / Self Care | Payer: 59 | Attending: Emergency Medicine | Admitting: Emergency Medicine

## 2017-02-27 DIAGNOSIS — Z853 Personal history of malignant neoplasm of breast: Secondary | ICD-10-CM | POA: Diagnosis not present

## 2017-02-27 DIAGNOSIS — I10 Essential (primary) hypertension: Secondary | ICD-10-CM | POA: Insufficient documentation

## 2017-02-27 DIAGNOSIS — F909 Attention-deficit hyperactivity disorder, unspecified type: Secondary | ICD-10-CM | POA: Insufficient documentation

## 2017-02-27 DIAGNOSIS — R112 Nausea with vomiting, unspecified: Secondary | ICD-10-CM | POA: Diagnosis present

## 2017-02-27 DIAGNOSIS — Z91040 Latex allergy status: Secondary | ICD-10-CM | POA: Insufficient documentation

## 2017-02-27 DIAGNOSIS — Z87891 Personal history of nicotine dependence: Secondary | ICD-10-CM | POA: Insufficient documentation

## 2017-02-27 DIAGNOSIS — R197 Diarrhea, unspecified: Secondary | ICD-10-CM | POA: Diagnosis not present

## 2017-02-27 DIAGNOSIS — Z79899 Other long term (current) drug therapy: Secondary | ICD-10-CM | POA: Diagnosis not present

## 2017-02-27 LAB — URINALYSIS, ROUTINE W REFLEX MICROSCOPIC
BILIRUBIN URINE: NEGATIVE
Glucose, UA: NEGATIVE mg/dL
Hgb urine dipstick: NEGATIVE
Ketones, ur: NEGATIVE mg/dL
Leukocytes, UA: NEGATIVE
NITRITE: NEGATIVE
PH: 6 (ref 5.0–8.0)
Protein, ur: NEGATIVE mg/dL
SPECIFIC GRAVITY, URINE: 1.017 (ref 1.005–1.030)

## 2017-02-27 LAB — CBC
HEMATOCRIT: 40.5 % (ref 36.0–46.0)
HEMOGLOBIN: 13.1 g/dL (ref 12.0–15.0)
MCH: 28.1 pg (ref 26.0–34.0)
MCHC: 32.3 g/dL (ref 30.0–36.0)
MCV: 86.9 fL (ref 78.0–100.0)
Platelets: 202 10*3/uL (ref 150–400)
RBC: 4.66 MIL/uL (ref 3.87–5.11)
RDW: 14.1 % (ref 11.5–15.5)
WBC: 6.7 10*3/uL (ref 4.0–10.5)

## 2017-02-27 LAB — COMPREHENSIVE METABOLIC PANEL
ALBUMIN: 3.7 g/dL (ref 3.5–5.0)
ALT: 22 U/L (ref 14–54)
ANION GAP: 9 (ref 5–15)
AST: 25 U/L (ref 15–41)
Alkaline Phosphatase: 53 U/L (ref 38–126)
BUN: 13 mg/dL (ref 6–20)
CHLORIDE: 105 mmol/L (ref 101–111)
CO2: 27 mmol/L (ref 22–32)
Calcium: 9 mg/dL (ref 8.9–10.3)
Creatinine, Ser: 1.01 mg/dL — ABNORMAL HIGH (ref 0.44–1.00)
GFR calc Af Amer: >60 mL/min (ref >60)
GFR calc non Af Amer: >60 mL/min (ref >60)
GLUCOSE: 112 mg/dL — AB (ref 65–99)
POTASSIUM: 4 mmol/L (ref 3.5–5.1)
SODIUM: 141 mmol/L (ref 135–145)
Total Bilirubin: 0.6 mg/dL (ref 0.3–1.2)
Total Protein: 6.7 g/dL (ref 6.5–8.1)

## 2017-02-27 LAB — LIPASE, BLOOD: LIPASE: 29 U/L (ref 11–51)

## 2017-02-27 MED ORDER — LOPERAMIDE HCL 2 MG PO CAPS: 2.0000 mg | ORAL_CAPSULE | Freq: Four times a day (QID) | ORAL | 0 refills | Status: DC | PRN

## 2017-02-27 MED ORDER — DICYCLOMINE HCL 20 MG PO TABS
20.0000 mg | ORAL_TABLET | Freq: Two times a day (BID) | ORAL | 0 refills | Status: DC
Start: 2017-02-27 — End: 2017-07-15

## 2017-02-27 MED ORDER — ONDANSETRON HCL 4 MG PO TABS: 4.0000 mg | ORAL_TABLET | Freq: Three times a day (TID) | ORAL | 0 refills | Status: DC | PRN

## 2017-02-27 MED ORDER — ONDANSETRON 4 MG PO TBDP
ORAL_TABLET | ORAL | Status: AC
  Filled 2017-02-27: qty 1

## 2017-02-27 MED ORDER — DICYCLOMINE HCL 10 MG PO CAPS
20.0000 mg | ORAL_CAPSULE | Freq: Once | ORAL | Status: AC
  Administered 2017-02-27: 20 mg via ORAL
  Filled 2017-02-27: qty 2

## 2017-02-27 MED ORDER — ONDANSETRON 4 MG PO TBDP
4.0000 mg | ORAL_TABLET | Freq: Once | ORAL | Status: AC | PRN
  Administered 2017-02-27: 4 mg via ORAL

## 2017-02-27 MED ORDER — ACETAMINOPHEN 325 MG PO TABS
650.0000 mg | ORAL_TABLET | Freq: Once | ORAL | Status: AC
  Administered 2017-02-27: 650 mg via ORAL
  Filled 2017-02-27: qty 2

## 2017-02-27 NOTE — ED Provider Notes (Signed)
[facility] DEPT Provider Note   CSN: [id number] Arrival date & time: 02/27/17  0201     History   Chief Complaint Chief Complaint  Patient presents with  . Abdominal Pain    HPI April Robinson is a 56 y.o. female who presents emergency Department with chief complaint of nausea, vomiting and diarrhea. Patient states that she ate shrimp fried rice from a [employer] and about. About 3 hours later awoke from sleep with diffuse, crampy abdominal pain, nausea, vomiting and diarrhea. She's had about 4 episodes of this prior to coming to the emergency department. She reports loose stools, and nonbloody, nonbilious vomitus. She continues to have some crampy abdominal pain and borborygmi, but denies any current focal abdominal pain. She has had no vomiting or diarrhea in the past 4 hours.   Abdominal Pain      Past Medical History:  Diagnosis Date  . ADD (attention deficit disorder with hyperactivity)   . Alcoholism ([place])   . Arthritis   . Cancer ([place])    breast L  . Esophageal reflux   . Hiatal hernia   . Hx of radiation therapy 08/27/11 to 10/15/11   L breast  . Hyperlipemia   . Hypertension    past not on any medications currently  . Personal history of colonic polyps    TUBULAR ADENOMA    Patient Active Problem List   Diagnosis Date Noted  . Left knee pain 04/23/2015  . Knee pain, left 04/11/2015  . Popliteal pain 04/11/2015  . Hypertension 10/17/2014  . Acute pharyngitis 09/04/2014  . Left shoulder pain 02/19/2014  . Benign paroxysmal positional vertigo 09/29/2013  . Hot flashes due to tamoxifen 08/16/2013  . Hx of vaginitis 04/15/2012  . De Quervain's disease (tenosynovitis) 04/05/2012  . Breast cancer of upper-outer quadrant of left female breast ([place])   . IBS 12/03/2010  . ELEVATED BP READING WITHOUT DX HYPERTENSION 12/03/2010  . NECK SPRAIN AND STRAIN 12/03/2010  . ANXIETY STATE, UNSPECIFIED 11/07/2010  . ADD 11/07/2010  . GERD 11/07/2010  . CHEST  PAIN UNSPECIFIED 11/07/2010    Past Surgical History:  Procedure Laterality Date  . BREAST LUMPECTOMY  07/23/11   left breast  . COLONOSCOPY    . LAPAROSCOPIC HYSTERECTOMY  1995    OB History    Gravida Para Term Preterm AB Living   3 3       3    SAB TAB Ectopic Multiple Live Births                   Home Medications    Prior to Admission medications   Medication Sig Start Date End Date Taking? Authorizing Provider  DEXILANT 60 MG capsule TAKE ONE CAPSULE BY MOUTH EVERY DAY 08/20/16  Yes Roma Schanz R, DO  gabapentin (NEURONTIN) 300 MG capsule Take 300 mg by mouth at bedtime as needed.   Yes [provider]  tamoxifen (NOLVADEX) 20 MG tablet Take 1 tablet (20 mg total) by mouth daily. 01/18/17  Yes Magrinat, Virgie Dad, MD  valACYclovir (VALTREX) 500 MG tablet Take one tab twice daily for 3-5 days with outbreak. 07/16/16  Yes Huel Cote, NP  benzonatate (TESSALON) 100 MG capsule Take 1-2 capsules (100-200 mg total) by mouth 3 (three) times daily as needed. Patient not taking: Reported on 10/02/2016 09/19/16   Posey Boyer, MD  diazepam (VALIUM) 5 MG tablet Take 0.5 tablets (2.5 mg total) by mouth every 8 (eight) hours as needed for  muscle spasms. Patient not taking: Reported on 10/02/2016 08/24/16   Mesner, Corene Cornea, MD  dicyclomine (BENTYL) 20 MG tablet Take 1 tablet (20 mg total) by mouth 2 (two) times daily. 02/27/17   Tayvin Preslar, PA-C  fluticasone (FLONASE) 50 MCG/ACT nasal spray Place 2 sprays into both nostrils daily. Patient not taking: Reported on 10/02/2016 09/19/16   Posey Boyer, MD  gabapentin (NEURONTIN) 100 MG capsule Take 1 capsule (100 mg total) by mouth at bedtime. Patient not taking: Reported on 10/02/2016 05/04/16   Saguier, Percell Miller, PA-C  HYDROcodone-acetaminophen [facility]) 5-325 MG tablet 1 tab po q 6 hours prn pain Patient not taking: Reported on 10/02/2016 05/04/16   Saguier, Percell Miller, PA-C  HYDROcodone-homatropine [facility]) 5-1.5 MG/5ML syrup  Take 5 mLs by mouth every 4 (four) hours as needed. Patient not taking: Reported on 02/27/2017 09/19/16   Posey Boyer, MD  ibuprofen (ADVIL,MOTRIN) 800 MG tablet Take 1 tablet (800 mg total) by mouth 3 (three) times daily. Patient not taking: Reported on 02/27/2017 08/24/16   Mesner, Corene Cornea, MD  loperamide (IMODIUM) 2 MG capsule Take 1 capsule (2 mg total) by mouth 4 (four) times daily as needed for diarrhea or loose stools. 02/27/17   Margarita Mail, PA-C  nystatin cream (MYCOSTATIN) Apply 1 application topically 2 (two) times daily. Patient not taking: Reported on 02/27/2017 04/23/16   Saguier, Percell Miller, PA-C  ondansetron (ZOFRAN) 4 MG tablet Take 1 tablet (4 mg total) by mouth every 8 (eight) hours as needed for nausea or vomiting. 02/27/17   Margarita Mail, PA-C    Family History Family History  Problem Relation Age of Onset  . Breast cancer Mother   . Cancer Mother     breast  . Diabetes Father   . Hypertension Father   . Cancer Brother     NEUROENDOCRINE  . Hypertension Brother   . Arthritis Brother     hips  . Colon cancer Neg Hx     Social History Social History  Substance Use Topics  . Smoking status: Former Research scientist (life sciences)  . Smokeless tobacco: Never Used  . Alcohol use No     Comment: rare     Allergies   Meloxicam; Miconazole nitrate; and Latex   Review of Systems Review of Systems  Gastrointestinal: Positive for abdominal pain.   Ten systems reviewed and are negative for acute change, except as noted in the HPI.    Physical Exam Updated Vital Signs BP 111/70   Pulse 62   Temp 97.5 F (36.4 C) (Oral)   Resp 18   Ht 5' 2.5" (1.588 m)   Wt 81.6 kg   SpO2 100%   BMI 32.40 kg/m   Physical Exam  Constitutional: She is oriented to person, place, and time. She appears well-developed and well-nourished. No distress.  HENT:  Head: Normocephalic and atraumatic.  Eyes: Conjunctivae are normal. No scleral icterus.  Neck: Normal range of motion.  Cardiovascular: Normal  rate, regular rhythm and normal heart sounds.  Exam reveals no gallop and no friction rub.   No murmur heard. Pulmonary/Chest: Effort normal and breath sounds normal. No respiratory distress.  Abdominal: Soft. Bowel sounds are normal. She exhibits no distension and no mass. There is no tenderness. There is no guarding.  Neurological: She is alert and oriented to person, place, and time.  Skin: Skin is warm and dry. Capillary refill takes less than 2 seconds. She is not diaphoretic.  Psychiatric: Her behavior is normal.  Nursing note and vitals reviewed.  ED Treatments / Results  Labs (all labs ordered are listed, but only abnormal results are displayed) Labs Reviewed  COMPREHENSIVE METABOLIC PANEL - Abnormal; Notable for the following:       Result Value   Glucose, Bld 112 (*)    Creatinine, Ser 1.01 (*)    All other components within normal limits  GASTROINTESTINAL PANEL BY PCR, STOOL (REPLACES STOOL CULTURE)  LIPASE, BLOOD  CBC  URINALYSIS, ROUTINE W REFLEX MICROSCOPIC    EKG  EKG Interpretation None       Radiology No results found.  Procedures Procedures (including critical care time)  Medications Ordered in ED Medications  ondansetron (ZOFRAN-ODT) disintegrating tablet 4 mg (4 mg Oral Given 02/27/17 0227)  dicyclomine (BENTYL) capsule 20 mg (20 mg Oral Given 02/27/17 0711)  acetaminophen (TYLENOL) tablet 650 mg (650 mg Oral Given 02/27/17 0711)     Initial Impression / Assessment and Plan / ED Course  I have reviewed the triage vital signs and the nursing notes.  Pertinent labs & imaging results that were available during my care of the patient were reviewed by me and considered in my medical decision making (see chart for details).     Patient with symptoms consistent with gastroenteritis.  Vitals are stable, no fever.  No signs of dehydration, tolerating PO fluids > 6 oz.  Lungs are clear.  No focal abdominal pain, no concern for appendicitis, cholecystitis,  pancreatitis, ruptured viscus, UTI, kidney stone, or any other abdominal etiology.  Supportive therapy indicated with return if symptoms worsen.  Patient counseled.   Final Clinical Impressions(s) / ED Diagnoses   Final diagnoses:  Nausea vomiting and diarrhea    New Prescriptions New Prescriptions   DICYCLOMINE (BENTYL) 20 MG TABLET    Take 1 tablet (20 mg total) by mouth 2 (two) times daily.   LOPERAMIDE (IMODIUM) 2 MG CAPSULE    Take 1 capsule (2 mg total) by mouth 4 (four) times daily as needed for diarrhea or loose stools.   ONDANSETRON (ZOFRAN) 4 MG TABLET    Take 1 tablet (4 mg total) by mouth every 8 (eight) hours as needed for nausea or vomiting.     Margarita Mail, PA-C 02/27/17 0848    Tegeler, Gwenyth Allegra, MD 02/27/17 [phone]

## 2017-02-27 NOTE — ED Triage Notes (Signed)
Pt to ED from home c/o sudden onset generalized abd pain, N/V/D x 2 hours. Pt states she thinks she had bad [place] around 7-8pm this evening. Pt also endorsing chills.

## 2017-02-27 NOTE — Discharge Instructions (Signed)
.  Contact a health care provider if: °· You cannot keep fluids down. °· Your symptoms get worse. °· You have new symptoms. °· You feel light-headed or dizzy. °· You have muscle cramps. °Get help right away if: °· You have chest pain. °· You feel extremely weak or you faint. °· You see blood in your vomit. °· Your vomit looks like coffee grounds. °· You have bloody or black stools or stools that look like tar. °· You have a severe headache, a stiff neck, or both. °· You have a rash. °· You have severe pain, cramping, or bloating in your abdomen. °· You have trouble breathing or you are breathing very quickly. °· Your heart is beating very quickly. °· Your skin feels cold and clammy. °· You feel confused. °· You have pain when you urinate. °· You have signs of dehydration, such as: °? Dark urine, very little urine, or no urine. °? Cracked lips. °? Dry mouth. °? Sunken eyes. °? Sleepiness. °? Weakness. ° °

## 2017-03-10 ENCOUNTER — Encounter: Payer: Self-pay | Admitting: Gynecology

## 2017-03-26 ENCOUNTER — Ambulatory Visit: Payer: 59 | Admitting: Medical

## 2017-03-26 DIAGNOSIS — Z0289 Encounter for other administrative examinations: Secondary | ICD-10-CM

## 2017-03-29 ENCOUNTER — Telehealth: Payer: Self-pay | Admitting: Family Medicine

## 2017-03-29 NOTE — Telephone Encounter (Signed)
Pt called this morning stating wanted to make a complaint about not been seen on 03-26-2017 for her appt at 1:00, (pt came in late after the 10 min grace- on pt's comment it is on file the time pt came in to her appt notes- pt was mentioned had to reschedule (pt had mentioned same day that she was not aware of the time and that was why she was late)

## 2017-06-09 ENCOUNTER — Other Ambulatory Visit: Payer: Self-pay | Admitting: Family Medicine

## 2017-06-21 ENCOUNTER — Other Ambulatory Visit: Payer: Self-pay | Admitting: Pediatrics

## 2017-06-21 MED ORDER — DEXLANSOPRAZOLE 60 MG PO CPDR: 1.0000 | DELAYED_RELEASE_CAPSULE | Freq: Every day | ORAL | 3 refills | Status: DC

## 2017-06-21 NOTE — Telephone Encounter (Signed)
Last refilled 07/2016, labs 02/27/17, No CPE>24yr, pt is followed by oncology. Would you like to refill?

## 2017-06-29 ENCOUNTER — Ambulatory Visit (INDEPENDENT_AMBULATORY_CARE_PROVIDER_SITE_OTHER): Payer: 59 | Admitting: Physician Assistant

## 2017-06-29 ENCOUNTER — Encounter: Payer: Self-pay | Admitting: Physician Assistant

## 2017-06-29 VITALS — BP 137/90 | HR 73 | Resp 16 | Ht 62.5 in | Wt 186.2 lb

## 2017-06-29 DIAGNOSIS — H168 Other keratitis: Secondary | ICD-10-CM | POA: Diagnosis not present

## 2017-06-29 LAB — POCT SKIN KOH: Skin KOH, POC: NEGATIVE

## 2017-06-29 MED ORDER — TRIAMCINOLONE ACETONIDE 0.025 % EX OINT: 1.0000 "application " | TOPICAL_OINTMENT | Freq: Two times a day (BID) | CUTANEOUS | 5 refills | Status: DC | PRN

## 2017-06-29 NOTE — Progress Notes (Signed)
    06/29/2017 5:27 PM   DOB: [birth year] / MRN: [id number]  SUBJECTIVE:  April Robinson is a 56 y.o. female presenting for itchy rash of the posterior neck.  This started over a year ago.  It is very itchy.   She is allergic to meloxicam; miconazole nitrate; and latex.   She  has a past medical history of ADD (attention deficit disorder with hyperactivity); Alcoholism ([place]); Arthritis; Cancer ([place]); Esophageal reflux; Hiatal hernia; radiation therapy (08/27/11 to 10/15/11); Hyperlipemia; Hypertension; and Personal history of colonic polyps.    She  reports that she has quit smoking. She has never used smokeless tobacco. She reports that she does not drink alcohol or use drugs. She  reports that she currently engages in sexual activity and has had female partners. She reports using the following method of birth control/protection: Surgical. The patient  has a past surgical history that includes Laparoscopic hysterectomy (1995); Breast lumpectomy (07/23/11); and Colonoscopy.  Her family history includes Arthritis in her brother; Breast cancer in her mother; Cancer in her brother and mother; Diabetes in her father; Hypertension in her brother and father.  Review of Systems  Constitutional: Negative for chills, diaphoresis and fever.  Respiratory: Negative for shortness of breath.   Cardiovascular: Negative for chest pain, orthopnea and leg swelling.  Gastrointestinal: Negative for nausea.  Skin: Negative for rash.  Neurological: Negative for dizziness.    The problem list and medications were reviewed and updated by myself where necessary and exist elsewhere in the encounter.   OBJECTIVE:  BP 137/90 (BP Location: Right Arm, Patient Position: Sitting, Cuff Size: Large)   Pulse 73   Resp 16   Ht 5' 2.5" (1.588 m)   Wt 186 lb 3.2 oz (84.5 kg)   SpO2 100%   BMI 33.51 kg/m   Physical Exam  Constitutional: She is active.  Non-toxic appearance.  Cardiovascular: Normal rate.   Pulmonary/Chest:  Effort normal. No tachypnea.  Neurological: She is alert.  Skin: Skin is warm and dry. Rash noted. She is not diaphoretic. No pallor.    Lab Results  Component Value Date   HGBA1C 5.5 04/24/2016     No results found for this or any previous visit (from the past 72 hour(s)).  No results found.  ASSESSMENT AND PLAN:  [name] was seen today for rash.  Diagnoses and all orders for this visit:  Xerotic keratitis: RTC in about 1 month for punch if not improving.  Non diabetic as of 04/24/16.  -     triamcinolone (KENALOG) 0.025 % ointment; Apply 1 application topically 2 (two) times daily as needed. For itching.    The patient is advised to call or return to clinic if she does not see an improvement in symptoms, or to seek the care of the closest emergency department if she worsens with the above plan.   Philis Fendt, MHS, PA-C [facility] 06/29/2017 5:27 PM

## 2017-07-05 ENCOUNTER — Other Ambulatory Visit: Payer: Self-pay | Admitting: Women's Health

## 2017-07-15 ENCOUNTER — Encounter: Payer: Self-pay | Admitting: Family Medicine

## 2017-07-15 ENCOUNTER — Ambulatory Visit (INDEPENDENT_AMBULATORY_CARE_PROVIDER_SITE_OTHER): Payer: 59 | Admitting: Family Medicine

## 2017-07-15 VITALS — BP 142/96 | HR 73 | Temp 97.8°F | Resp 18 | Ht 62.5 in | Wt 187.0 lb

## 2017-07-15 DIAGNOSIS — J069 Acute upper respiratory infection, unspecified: Secondary | ICD-10-CM

## 2017-07-15 LAB — POCT INFLUENZA A/B
Influenza A, POC: NEGATIVE
Influenza B, POC: NEGATIVE

## 2017-07-15 NOTE — Patient Instructions (Signed)
     IF you received an x-ray today, you will receive an invoice from [facility]. Please contact [facility] at [phone] with questions or concerns regarding your invoice.   IF you received labwork today, you will receive an invoice from [facility]. Please contact [facility] at [phone] with questions or concerns regarding your invoice.   Our billing staff will not be able to assist you with questions regarding bills from these companies.  You will be contacted with the lab results as soon as they are available. The fastest way to get your results is to activate your My Chart account. Instructions are located on the last page of this paperwork. If you have not heard from us regarding the results in 2 weeks, please contact this office.     

## 2017-07-15 NOTE — Progress Notes (Signed)
9/20/20184:06 PM  April Robinson [birth year], 56 y.o. female [id number]  Chief Complaint  Patient presents with  . Chills    all started this morning   . Headache  . Fatigue  . Dizziness  . Cough    HPI Patient is a 56 y.o. female who presents today for chills, headache, fatigue, dizziness and cough that started this morning. + sick contacts. No flu vaccine this season. Worried she has the flu as it feels like when she had it before. Denies fevers, has not taken any OTC meds. Denies sob, wheezing, n/v/diarrhea.  Depression screen [facility] 2/9 07/15/2017 09/19/2016  Decreased Interest 0 0  Down, Depressed, Hopeless 0 0  PHQ - 2 Score 0 0    Allergies  Allergen Reactions  . Meloxicam Shortness Of Breath and Nausea Only  . Miconazole Nitrate Other (See Comments)    SWELLING OF SOFT PALATE THAT CAUSED DIFFICULTY SWALLOWING  . Latex Itching, Rash and Other (See Comments)    Fine, little bumps    Current Outpatient Prescriptions on File Prior to Visit  Medication Sig Dispense Refill  . dexlansoprazole (DEXILANT) 60 MG capsule Take 1 capsule (60 mg total) by mouth daily. 30 capsule 3  . gabapentin (NEURONTIN) 100 MG capsule Take 1 capsule (100 mg total) by mouth at bedtime. 30 capsule 3  . gabapentin (NEURONTIN) 300 MG capsule Take 300 mg by mouth at bedtime as needed.    Marland Kitchen HYDROcodone-acetaminophen (NORCO) 5-325 MG tablet 1 tab po q 6 hours prn pain 14 tablet 0  . HYDROcodone-homatropine (HYCODAN) 5-1.5 MG/5ML syrup Take 5 mLs by mouth every 4 (four) hours as needed. 120 mL 0  . ibuprofen (ADVIL,MOTRIN) 800 MG tablet Take 1 tablet (800 mg total) by mouth 3 (three) times daily. 21 tablet 0  . ondansetron (ZOFRAN) 4 MG tablet Take 1 tablet (4 mg total) by mouth every 8 (eight) hours as needed for nausea or vomiting. 10 tablet 0  . tamoxifen (NOLVADEX) 20 MG tablet Take 1 tablet (20 mg total) by mouth daily. 90 tablet 3  . triamcinolone (KENALOG) 0.025 % ointment Apply 1 application  topically 2 (two) times daily as needed. For itching. 30 g 5  . valACYclovir (VALTREX) 500 MG tablet Take one tab twice daily for 3-5 days with outbreak. 30 tablet 6  . valACYclovir (VALTREX) 500 MG tablet TAKE ONE TAB TWICE DAILY FOR 3-5 DAYS WITH OUTBREAK. 30 tablet 6   Current Facility-Administered Medications on File Prior to Visit  Medication Dose Route Frequency Provider Last Rate Last Dose  . 0.9 %  sodium chloride infusion  500 mL Intravenous Continuous Pyrtle, Lajuan Lines, MD        Past Medical History:  Diagnosis Date  . ADD (attention deficit disorder with hyperactivity)   . Alcoholism ([place])   . Arthritis   . Cancer ([place])    breast L  . Esophageal reflux   . Hiatal hernia   . Hx of radiation therapy 08/27/11 to 10/15/11   L breast  . Hyperlipemia   . Hypertension    past not on any medications currently  . Personal history of colonic polyps    TUBULAR ADENOMA    Past Surgical History:  Procedure Laterality Date  . BREAST LUMPECTOMY  07/23/11   left breast  . COLONOSCOPY    . [place]    Social History  Substance Use Topics  . Smoking status: Former Research scientist (life sciences)  . Smokeless tobacco: Never Used  .  Alcohol use No     Comment: rare    Family History  Problem Relation Age of Onset  . Breast cancer Mother   . Cancer Mother        breast  . Diabetes Father   . Hypertension Father   . Cancer Brother        NEUROENDOCRINE  . Hypertension Brother   . Arthritis Brother        hips  . Colon cancer Neg Hx     ROS  Per hpi   OBJECTIVE:  Blood pressure (!) 142/96, pulse 73, temperature 97.8 F (36.6 C), temperature source Oral, resp. rate 18, height 5' 2.5" (1.588 m), weight 187 lb (84.8 kg), SpO2 95 %.  Physical Exam  Constitutional: She is oriented to person, place, and time and well-developed, well-nourished, and in no distress.  HENT:  Head: Normocephalic and atraumatic.  Right Ear: Hearing, tympanic membrane, external ear and ear  canal normal.  Left Ear: Hearing, tympanic membrane, external ear and ear canal normal.  Mouth/Throat: Oropharynx is clear and moist.  Eyes: Pupils are equal, round, and reactive to light. EOM are normal.  Neck: Neck supple.  Cardiovascular: Normal rate, regular rhythm and normal heart sounds.  Exam reveals no gallop and no friction rub.   No murmur heard. Pulmonary/Chest: Effort normal and breath sounds normal. She has no wheezes. She has no rales.  Abdominal: Soft. Bowel sounds are normal. She exhibits no distension. There is no tenderness.  Lymphadenopathy:    She has no cervical adenopathy.  Neurological: She is alert and oriented to person, place, and time. Gait normal.  Skin: Skin is warm and dry.      ASSESSMENT and PLAN:  1. URI, acute Discussed supportive measures for URI: increase hydration, rest, OTC medications, etc. RTC precautions discussed. - POCT Influenza A/B - negative       Rutherford Guys, MD Primary Care at [place], [place] [address] Ph.  [phone] Fax [phone]

## 2017-07-23 ENCOUNTER — Ambulatory Visit (INDEPENDENT_AMBULATORY_CARE_PROVIDER_SITE_OTHER): Payer: 59 | Admitting: Family Medicine

## 2017-07-23 ENCOUNTER — Telehealth: Payer: Self-pay | Admitting: Family Medicine

## 2017-07-23 ENCOUNTER — Encounter: Payer: Self-pay | Admitting: Family Medicine

## 2017-07-23 VITALS — BP 132/94 | HR 94 | Temp 98.3°F | Resp 18 | Ht 63.58 in | Wt 184.6 lb

## 2017-07-23 DIAGNOSIS — J00 Acute nasopharyngitis [common cold]: Secondary | ICD-10-CM

## 2017-07-23 DIAGNOSIS — R030 Elevated blood-pressure reading, without diagnosis of hypertension: Secondary | ICD-10-CM

## 2017-07-23 DIAGNOSIS — Z7689 Persons encountering health services in other specified circumstances: Secondary | ICD-10-CM | POA: Diagnosis not present

## 2017-07-23 DIAGNOSIS — M7660 Achilles tendinitis, unspecified leg: Secondary | ICD-10-CM | POA: Diagnosis not present

## 2017-07-23 MED ORDER — FLUTICASONE PROPIONATE 50 MCG/ACT NA SUSP: 2.0000 | Freq: Every day | NASAL | 6 refills | Status: DC

## 2017-07-23 NOTE — Patient Instructions (Addendum)
IF you received an x-ray today, you will receive an invoice from [facility]. Please contact [facility] at [phone] with questions or concerns regarding your invoice.   IF you received labwork today, you will receive an invoice from [facility]. Please contact [facility] at [phone] with questions or concerns regarding your invoice.   Our billing staff will not be able to assist you with questions regarding bills from these companies.  You will be contacted with the lab results as soon as they are available. The fastest way to get your results is to activate your My Chart account. Instructions are located on the last page of this paperwork. If you have not heard from [place] regarding the results in 2 weeks, please contact this office.     DASH Eating Plan DASH stands for "Dietary Approaches to Stop Hypertension." The DASH eating plan is a healthy eating plan that has been shown to reduce high blood pressure (hypertension). It may also reduce your risk for type 2 diabetes, heart disease, and stroke. The DASH eating plan may also help with weight loss. What are tips for following this plan? General guidelines  Avoid eating more than 2,300 mg (milligrams) of salt (sodium) a day. If you have hypertension, you may need to reduce your sodium intake to 1,500 mg a day.  Limit alcohol intake to no more than 1 drink a day for nonpregnant women and 2 drinks a day for men. One drink equals 12 oz of beer, 5 oz of wine, or 1 oz of hard liquor.  Work with your health care provider to maintain a healthy body weight or to lose weight. Ask what an ideal weight is for you.  Get at least 30 minutes of exercise that causes your heart to beat faster (aerobic exercise) most days of the week. Activities may include walking, swimming, or biking.  Work with your health care provider or diet and nutrition specialist (dietitian) to adjust your eating plan to your individual calorie  needs. Reading food labels  Check food labels for the amount of sodium per serving. Choose foods with less than 5 percent of the Daily Value of sodium. Generally, foods with less than 300 mg of sodium per serving fit into this eating plan.  To find whole grains, look for the word "whole" as the first word in the ingredient list. Shopping  Buy products labeled as "low-sodium" or "no salt added."  Buy fresh foods. Avoid canned foods and premade or frozen meals. Cooking  Avoid adding salt when cooking. Use salt-free seasonings or herbs instead of table salt or sea salt. Check with your health care provider or pharmacist before using salt substitutes.  Do not fry foods. Cook foods using healthy methods such as baking, boiling, grilling, and broiling instead.  Cook with heart-healthy oils, such as olive, canola, soybean, or sunflower oil. Meal planning   Eat a balanced diet that includes: ? 5 or more servings of fruits and vegetables each day. At each meal, try to fill half of your plate with fruits and vegetables. ? Up to 6-8 servings of whole grains each day. ? Less than 6 oz of lean meat, poultry, or fish each day. A 3-oz serving of meat is about the same size as a deck of cards. One egg equals 1 oz. ? 2 servings of low-fat dairy each day. ? A serving of nuts, seeds, or beans 5 times each week. ? Heart-healthy fats. Healthy fats called Omega-3 fatty acids are found  in foods such as flaxseeds and coldwater fish, like sardines, salmon, and mackerel.  Limit how much you eat of the following: ? Canned or prepackaged foods. ? Food that is high in trans fat, such as fried foods. ? Food that is high in saturated fat, such as fatty meat. ? Sweets, desserts, sugary drinks, and other foods with added sugar. ? Full-fat dairy products.  Do not salt foods before eating.  Try to eat at least 2 vegetarian meals each week.  Eat more home-cooked food and less restaurant, buffet, and fast  food.  When eating at a restaurant, ask that your food be prepared with less salt or no salt, if possible. What foods are recommended? The items listed may not be a complete list. Talk with your dietitian about what dietary choices are best for you. Grains Whole-grain or whole-wheat bread. Whole-grain or whole-wheat pasta. Brown rice. April Robinson. Bulgur. Whole-grain and low-sodium cereals. Pita bread. Low-fat, low-sodium crackers. Whole-wheat flour tortillas. Vegetables Fresh or frozen vegetables (raw, steamed, roasted, or grilled). Low-sodium or reduced-sodium tomato and vegetable juice. Low-sodium or reduced-sodium tomato sauce and tomato paste. Low-sodium or reduced-sodium canned vegetables. Fruits All fresh, dried, or frozen fruit. Canned fruit in natural juice (without added sugar). Meat and other protein foods Skinless chicken or [place]. Ground chicken or [place]. Pork with fat trimmed off. Fish and seafood. Egg whites. Dried beans, peas, or lentils. Unsalted nuts, nut butters, and seeds. Unsalted canned beans. Lean cuts of beef with fat trimmed off. Low-sodium, lean deli meat. Dairy Low-fat (1%) or fat-free (skim) milk. Fat-free, low-fat, or reduced-fat cheeses. Nonfat, low-sodium ricotta or cottage cheese. Low-fat or nonfat yogurt. Low-fat, low-sodium cheese. Fats and oils Soft margarine without trans fats. Vegetable oil. Low-fat, reduced-fat, or light mayonnaise and salad dressings (reduced-sodium). Canola, safflower, olive, soybean, and sunflower oils. Avocado. Seasoning and other foods Herbs. Spices. Seasoning mixes without salt. Unsalted popcorn and pretzels. Fat-free sweets. What foods are not recommended? The items listed may not be a complete list. Talk with your dietitian about what dietary choices are best for you. Grains Baked goods made with fat, such as croissants, muffins, or some breads. Dry pasta or rice meal packs. Vegetables Creamed or fried vegetables. Vegetables  in a cheese sauce. Regular canned vegetables (not low-sodium or reduced-sodium). Regular canned tomato sauce and paste (not low-sodium or reduced-sodium). Regular tomato and vegetable juice (not low-sodium or reduced-sodium). April Robinson. Olives. Fruits Canned fruit in a light or heavy syrup. Fried fruit. Fruit in cream or butter sauce. Meat and other protein foods Fatty cuts of meat. Ribs. Fried meat. April Robinson. Sausage. Bologna and other processed lunch meats. Salami. Fatback. Hotdogs. Bratwurst. Salted nuts and seeds. Canned beans with added salt. Canned or smoked fish. Whole eggs or egg yolks. Chicken or [place] with skin. Dairy Whole or 2% milk, cream, and half-and-half. Whole or full-fat cream cheese. Whole-fat or sweetened yogurt. Full-fat cheese. Nondairy creamers. Whipped toppings. Processed cheese and cheese spreads. Fats and oils Butter. Stick margarine. Lard. Shortening. Ghee. Bacon fat. Tropical oils, such as coconut, palm kernel, or palm oil. Seasoning and other foods Salted popcorn and pretzels. Onion salt, garlic salt, seasoned salt, table salt, and sea salt. Worcestershire sauce. Tartar sauce. Barbecue sauce. Teriyaki sauce. Soy sauce, including reduced-sodium. Steak sauce. Canned and packaged gravies. Fish sauce. Oyster sauce. Cocktail sauce. Horseradish that you find on the shelf. Ketchup. Mustard. Meat flavorings and tenderizers. Bouillon cubes. Hot sauce and Tabasco sauce. Premade or packaged marinades. Premade or packaged taco seasonings. Relishes.  and tenderizers. Bouillon cubes. Hot sauce and Tabasco sauce. Premade or packaged marinades. Premade or packaged taco seasonings. Relishes. Regular salad dressings. Where to find more information:  National Heart, Lung, and Blood Institute: www.nhlbi.nih.gov  American Heart Association: www.heart.org Summary  The DASH eating plan is a healthy eating plan that has been shown to reduce high blood pressure (hypertension). It may also reduce your risk for type 2 diabetes, heart disease, and stroke.  With the DASH eating plan, you should limit salt (sodium) intake  to 2,300 mg a day. If you have hypertension, you may need to reduce your sodium intake to 1,500 mg a day.  When on the DASH eating plan, aim to eat more fresh fruits and vegetables, whole grains, lean proteins, low-fat dairy, and heart-healthy fats.  Work with your health care provider or diet and nutrition specialist (dietitian) to adjust your eating plan to your individual calorie needs. This information is not intended to replace advice given to you by your health care provider. Make sure you discuss any questions you have with your health care provider. Document Released: 10/01/2011 Document Revised: 10/05/2016 Document Reviewed: 10/05/2016 Elsevier Interactive Patient Education  2017 Elsevier Inc.  

## 2017-07-23 NOTE — Telephone Encounter (Signed)
Had to change time of pts apt on 07/23/17 from 520 to 4pm if she can not make this apt then she will need to change to a different day--[place] can not have an EST Care at 520

## 2017-07-24 ENCOUNTER — Encounter: Payer: Self-pay | Admitting: Family Medicine

## 2017-07-24 NOTE — Progress Notes (Signed)
9/29/201811:26 AM  April Robinson Feb 26, 1961, 56 y.o. female [id number]  Chief Complaint  Patient presents with  . Establish Care  . Foot Pain    X 33mth- right foot    HPI:   Patient is a 56 y.o. female with past medical history significant for LEFT breast cancer, in remission, GERD with esophagitis and HH, and HTN who presents today to establish care.  Patient states that last time she saw a PCP was over a year ago.   1. Left breast cancer s/p lumpectomy and radiation, on tamoxifen. sees breast cancer center once a year.   2. GERD is well controlled on current medication.   3. Anxiety/depression, not active now. Reports significant stressors in personal and work life. Her mother has advancing dementia, does not live that close, her father is in denial, family struggling with next steps for caring of her mother. She used to slow line dance and walk and stress relief and exercise but has not been able to do so due to right foot pain.  4. HTN on chart, patient states that she has never been rx medication, several years ago when struggling with anxiety it was much higher. She tries to manage her BP with LFM. Follows low salt diet. Not been able to exercise for past several months.  5. Colonic polyps, last colonoscopy in 09/2016 w tubular adenoma x 1, repeat in 5 years  6. Right foot pain, x 6 months, no trauma, area of achilles tendon, prn ibu and rest help. Has slowed her down.   Meds reviewed, takes dexilant and tamoxifen daily, other meds very prn.  Depression screen [facility] 2/9 07/23/2017 07/15/2017 09/19/2016  Decreased Interest 0 0 0  Down, Depressed, Hopeless 0 0 0  PHQ - 2 Score 0 0 0    Allergies  Allergen Reactions  . Meloxicam Shortness Of Breath and Nausea Only  . Miconazole Nitrate Other (See Comments)    SWELLING OF SOFT PALATE THAT CAUSED DIFFICULTY SWALLOWING  . Latex Itching, Rash and Other (See Comments)    Fine, little bumps    Current Outpatient  Prescriptions on File Prior to Visit  Medication Sig Dispense Refill  . dexlansoprazole (DEXILANT) 60 MG capsule Take 1 capsule (60 mg total) by mouth daily. 30 capsule 3  . gabapentin (NEURONTIN) 100 MG capsule Take 1 capsule (100 mg total) by mouth at bedtime. 30 capsule 3  . gabapentin (NEURONTIN) 300 MG capsule Take 300 mg by mouth at bedtime as needed.    Marland Kitchen HYDROcodone-acetaminophen (NORCO) 5-325 MG tablet 1 tab po q 6 hours prn pain 14 tablet 0  . HYDROcodone-homatropine (HYCODAN) 5-1.5 MG/5ML syrup Take 5 mLs by mouth every 4 (four) hours as needed. 120 mL 0  . ibuprofen (ADVIL,MOTRIN) 800 MG tablet Take 1 tablet (800 mg total) by mouth 3 (three) times daily. 21 tablet 0  . tamoxifen (NOLVADEX) 20 MG tablet Take 1 tablet (20 mg total) by mouth daily. 90 tablet 3  . triamcinolone (KENALOG) 0.025 % ointment Apply 1 application topically 2 (two) times daily as needed. For itching. 30 g 5  . ondansetron (ZOFRAN) 4 MG tablet Take 1 tablet (4 mg total) by mouth every 8 (eight) hours as needed for nausea or vomiting. (Patient not taking: Reported on 07/23/2017) 10 tablet 0   Current Facility-Administered Medications on File Prior to Visit  Medication Dose Route Frequency Provider Last Rate Last Dose  . 0.9 %  sodium chloride infusion  500 mL Intravenous Continuous  Pyrtle, Lajuan Lines, MD        Past Medical History:  Diagnosis Date  . Alcoholism ([place])   . Anxiety   . Arthritis   . Cancer ([place])    breast L  . Esophageal reflux   . Hiatal hernia   . Hx of radiation therapy 08/27/11 to 10/15/11   L breast  . Hyperlipemia   . Hypertension    past not on any medications currently  . Personal history of colonic polyps    TUBULAR ADENOMA    Past Surgical History:  Procedure Laterality Date  . ABDOMINAL HYSTERECTOMY     partial for DUB  . BREAST LUMPECTOMY  07/23/11   left breast  . COLONOSCOPY    . [place]    Social History  Substance Use Topics  . Smoking  status: Former Smoker    Types: Cigarettes    Quit date: 58  . Smokeless tobacco: Never Used  . Alcohol use 0.0 oz/week     Comment: rare    Family History  Problem Relation Age of Onset  . Breast cancer Mother   . Cancer Mother        breast  . Hyperlipidemia Mother   . Diabetes Father   . Hypertension Father   . Cancer Father   . Hyperlipidemia Father   . Cancer Brother        NEUROENDOCRINE  . Hypertension Brother   . Arthritis Brother        hips  . Colon cancer Neg Hx     Review of Systems  Constitutional: Negative for chills, fever, malaise/fatigue and weight loss.  HENT: Positive for congestion. Negative for ear pain, sinus pain and sore throat.   Respiratory: Negative for cough and shortness of breath.   Cardiovascular: Negative for chest pain, palpitations and leg swelling.  Gastrointestinal: Positive for constipation and heartburn. Negative for abdominal pain, blood in stool, diarrhea, melena, nausea and vomiting.  Genitourinary: Negative for dysuria and hematuria.  Musculoskeletal: Positive for joint pain.  Neurological: Negative for sensory change, speech change and focal weakness.  Psychiatric/Behavioral: Negative for substance abuse and suicidal ideas.     OBJECTIVE:  Blood pressure (!) 132/94, pulse 94, temperature 98.3 F (36.8 C), temperature source Oral, resp. rate 18, height 5' 3.58" (1.615 m), weight 184 lb 9.6 oz (83.7 kg), SpO2 98 %.  Physical Exam  Constitutional: She is oriented to person, place, and time and well-developed, well-nourished, and in no distress.  HENT:  Head: Normocephalic and atraumatic.  Right Ear: Hearing, tympanic membrane, external ear and ear canal normal.  Left Ear: Hearing, tympanic membrane, external ear and ear canal normal.  Nose: Mucosal edema and rhinorrhea present. Right sinus exhibits no maxillary sinus tenderness and no frontal sinus tenderness. Left sinus exhibits no maxillary sinus tenderness and no frontal  sinus tenderness.  Mouth/Throat: Oropharynx is clear and moist.  Eyes: Pupils are equal, round, and reactive to light. EOM are normal.  Neck: Neck supple. No thyromegaly present.  Cardiovascular: Normal rate, regular rhythm, normal heart sounds and intact distal pulses.  Exam reveals no gallop and no friction rub.   No murmur heard. Pulmonary/Chest: Effort normal and breath sounds normal. She has no wheezes. She has no rales.  Abdominal: Soft. Bowel sounds are normal. She exhibits no distension and no mass. There is no tenderness.  Musculoskeletal: Normal range of motion. She exhibits no edema.  Left ankle, FROM, no erythema or warmth, TTP just proximal to  achilles tendon insertion, minimal swelling medial to area of TTP.   Lymphadenopathy:    She has no cervical adenopathy.  Neurological: She is alert and oriented to person, place, and time. Gait normal.  Skin: Skin is warm and dry.    No results found for this or any previous visit (from the past 24 hour(s)).  No results found.   ASSESSMENT and PLAN  1. Encounter to establish care Reviewed patient's PMH, PSH, FHx, SHx, Meds and allergies.  2. ELEVATED BP READING WITHOUT DX HYPERTENSION Patient with HTN diagnosis on chart, states that was at a time of significant anxiety. I discussed with her BP goals, today DBP > 90. Discussed ways to exercise that might not flare up her right foot pain. Discussed diet and exercise. Encouraged ambulatory monitoring.   3. Acute rhinitis - fluticasone (FLONASE) 50 MCG/ACT nasal spray; Place 2 sprays into both nostrils daily.  4. Achilles tendon pain - Ambulatory referral to Podiatry   Return in about 3 months (around 10/22/2017) for blood pressure.    Rutherford Guys, MD Primary Care at [place], [place] [address] Ph.  [phone] Fax [phone]

## 2017-08-05 ENCOUNTER — Encounter: Payer: Self-pay | Admitting: Podiatry

## 2017-08-05 ENCOUNTER — Ambulatory Visit (INDEPENDENT_AMBULATORY_CARE_PROVIDER_SITE_OTHER): Payer: 59 | Admitting: Podiatry

## 2017-08-05 VITALS — BP 136/88 | HR 78 | Ht 62.0 in | Wt 184.0 lb

## 2017-08-05 DIAGNOSIS — M6701 Short Achilles tendon (acquired), right ankle: Secondary | ICD-10-CM

## 2017-08-05 DIAGNOSIS — M21969 Unspecified acquired deformity of unspecified lower leg: Secondary | ICD-10-CM

## 2017-08-05 DIAGNOSIS — M7661 Achilles tendinitis, right leg: Secondary | ICD-10-CM | POA: Diagnosis not present

## 2017-08-05 NOTE — Patient Instructions (Addendum)
Seen for pain in right achilles tendon. Noted of tight Achilles tendon right with weak and elevated first metatarsal bone. Reviewed findings and available treatment options. Need daily stretch exercise as discussed. Night splint dispensed x 10. Return for custom orthotics.

## 2017-08-05 NOTE — Progress Notes (Signed)
SUBJECTIVE: 56 y.o. year old female presents complaining of pain at right Achilles tendon area for duration of about 8 months. Pain has been off and on. Now pain is more severe and more frequent. 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 At home she is very active and participate in line dancing. Patient relates history of plantar fasciitis many years ago and was treated with orthotics, which was difficult to wear.  Patient is referred by Dr. Carollee Herter.   HPI: Breast cancer survivor for 7 years. Under medical management for reflux and cough problem. Taking Gabapentin for hot flash that prevents her from sleeping. Both knee problem with arthritis x 2-3 years.   Review of Systems  Constitutional: Negative for chills, diaphoresis, fever, malaise/fatigue and weight loss.  HENT: Negative.   Eyes: Negative.   Respiratory: Negative.   Cardiovascular: Negative.   Gastrointestinal: Positive for heartburn.  Genitourinary: Negative.   Musculoskeletal: Positive for joint pain. Negative for back pain and myalgias.  Neurological: Negative.    OBJECTIVE: DERMATOLOGIC EXAMINATION: Positive of plantar callus under 2nd MPJ left, 2and and 4th MPJ right. VASCULAR EXAMINATION OF LOWER LIMBS: All pedal pulses are palpable with normal pulsation.  Capillary Filling times within 3 seconds in all digits.  No edema or erythema noted. Temperature gradient from tibial crest to dorsum of foot is within normal bilateral.  NEUROLOGIC EXAMINATION OF THE LOWER LIMBS: All epicritic and tactile sensations grossly intact. Sharp and Dull discriminatory sensations at the plantar ball of hallux is intact bilateral.   MUSCULOSKELETAL EXAMINATION: Positive for hallux valgus with bunion deformity bilateral.  Contracted lesser digits with plantar calluses bilateral. Tight Achilles tendon on right. Left side is normal. Pain in posterior heel without inflammation or edema.  ASSESSMENT: Tight Achilles tendon  right. Achilles tendonitis right. Arthritic knee pain bilateral. Hallux valgus with bunion deformity bilateral. Elevated first ray bilateral. Hammer toe deformities lesser digits bilateral. Asymptomatic plantar calluses bilateral.  PLAN: Reviewed findings and available treatment options, stretch exercise, orthotics, Night Splint. Stretch exercise reviewed to practice daily at home. Night Splint dispensed with instruction. May benefit from a new pair of custom orthotics.

## 2017-09-06 ENCOUNTER — Ambulatory Visit: Payer: 59 | Admitting: Oncology

## 2017-09-06 ENCOUNTER — Other Ambulatory Visit: Payer: 59

## 2017-09-08 ENCOUNTER — Other Ambulatory Visit: Payer: Self-pay | Admitting: Oncology

## 2017-09-25 ENCOUNTER — Encounter (HOSPITAL_COMMUNITY): Payer: Self-pay

## 2017-09-25 ENCOUNTER — Emergency Department (HOSPITAL_COMMUNITY): Payer: 59

## 2017-09-25 ENCOUNTER — Other Ambulatory Visit: Payer: Self-pay

## 2017-09-25 ENCOUNTER — Emergency Department (HOSPITAL_COMMUNITY)
Admission: EM | Admit: 2017-09-25 | Discharge: 2017-09-25 | Disposition: A | Discharge status: Home / Self Care | Payer: 59 | Attending: Emergency Medicine | Admitting: Emergency Medicine

## 2017-09-25 DIAGNOSIS — S161XXA Strain of muscle, fascia and tendon at neck level, initial encounter: Secondary | ICD-10-CM | POA: Insufficient documentation

## 2017-09-25 DIAGNOSIS — Z87891 Personal history of nicotine dependence: Secondary | ICD-10-CM | POA: Diagnosis not present

## 2017-09-25 DIAGNOSIS — M25512 Pain in left shoulder: Secondary | ICD-10-CM

## 2017-09-25 DIAGNOSIS — Y999 Unspecified external cause status: Secondary | ICD-10-CM | POA: Insufficient documentation

## 2017-09-25 DIAGNOSIS — Z79899 Other long term (current) drug therapy: Secondary | ICD-10-CM | POA: Diagnosis not present

## 2017-09-25 DIAGNOSIS — G8911 Acute pain due to trauma: Secondary | ICD-10-CM | POA: Insufficient documentation

## 2017-09-25 DIAGNOSIS — I10 Essential (primary) hypertension: Secondary | ICD-10-CM | POA: Diagnosis not present

## 2017-09-25 DIAGNOSIS — Y9389 Activity, other specified: Secondary | ICD-10-CM | POA: Diagnosis not present

## 2017-09-25 DIAGNOSIS — S4992XA Unspecified injury of left shoulder and upper arm, initial encounter: Secondary | ICD-10-CM | POA: Diagnosis not present

## 2017-09-25 DIAGNOSIS — C50412 Malignant neoplasm of upper-outer quadrant of left female breast: Secondary | ICD-10-CM | POA: Diagnosis not present

## 2017-09-25 DIAGNOSIS — Y92410 Unspecified street and highway as the place of occurrence of the external cause: Secondary | ICD-10-CM | POA: Diagnosis not present

## 2017-09-25 MED ORDER — CYCLOBENZAPRINE HCL 5 MG PO TABS: ORAL_TABLET | ORAL | 0 refills | Status: DC

## 2017-09-25 MED ORDER — IBUPROFEN 800 MG PO TABS: 800.0000 mg | ORAL_TABLET | Freq: Three times a day (TID) | ORAL | 0 refills | Status: DC
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
## 2017-09-30 ENCOUNTER — Encounter: Payer: Self-pay | Admitting: Family Medicine

## 2017-09-30 ENCOUNTER — Other Ambulatory Visit: Payer: Self-pay

## 2017-09-30 ENCOUNTER — Ambulatory Visit: Payer: 59 | Admitting: Family Medicine

## 2017-09-30 DIAGNOSIS — M545 Low back pain, unspecified: Secondary | ICD-10-CM

## 2017-09-30 DIAGNOSIS — M549 Dorsalgia, unspecified: Secondary | ICD-10-CM | POA: Diagnosis not present
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
## 2017-09-30 NOTE — Patient Instructions (Addendum)
  1. Myofascial massage therapist 2. Trial of OTC pain patches 3. Gentle stretching, moist heat 4. Continue with ibuprofen and trial of 1/2 tab of cyclobenzaprine   IF you received an x-ray today, you will receive an invoice from [facility]. Please contact [facility] at [phone] with questions or concerns regarding your invoice.   IF you received labwork today, you will receive an invoice from [facility]. Please contact [facility] at [phone] with questions or concerns regarding your invoice.   Our billing staff will not be able to assist you with questions regarding bills from these companies.  You will be contacted with the lab results as soon as they are available. The fastest way to get your results is to activate your My Chart account. Instructions are located on the last page of this paperwork. If you have not heard from [place] regarding the results in 2 weeks, please contact this office.

## 2017-10-04 ENCOUNTER — Encounter: Payer: Self-pay | Admitting: Family Medicine

## 2017-10-11 ENCOUNTER — Telehealth: Payer: Self-pay | Admitting: Family Medicine

## 2017-10-11 DIAGNOSIS — M549 Dorsalgia, unspecified: Secondary | ICD-10-CM

## 2017-10-11 DIAGNOSIS — M545 Low back pain, unspecified: Secondary | ICD-10-CM

## 2017-10-11 NOTE — Telephone Encounter (Signed)
CRM for notification. See Telephone encounter for:   10/11/17.  Relation to EL:FYBO Call back number:[phone]   Reason for call:  Patient requesting referral to physical therapy due to back spasm, patient states she was seen by Dr. Pamella Pert 09/30/17, please advise

## 2017-10-12 ENCOUNTER — Ambulatory Visit: Payer: 59 | Admitting: Family Medicine

## 2017-10-12 ENCOUNTER — Other Ambulatory Visit: Payer: Self-pay

## 2017-10-12 ENCOUNTER — Encounter: Payer: Self-pay | Admitting: Family Medicine

## 2017-10-12 VITALS — BP 128/88 | HR 74 | Temp 98.3°F | Resp 16 | Ht 62.0 in | Wt 188.0 lb

## 2017-10-12 DIAGNOSIS — M545 Low back pain, unspecified: Secondary | ICD-10-CM

## 2017-10-12 DIAGNOSIS — M549 Dorsalgia, unspecified: Secondary | ICD-10-CM | POA: Diagnosis not present

## 2017-10-12 MED ORDER — TIZANIDINE HCL 4 MG PO TABS: 4.0000 mg | ORAL_TABLET | Freq: Four times a day (QID) | ORAL | 0 refills | Status: DC | PRN

## 2017-10-12 MED ORDER — VALACYCLOVIR HCL 500 MG PO TABS: ORAL_TABLET | ORAL | 2 refills | Status: DC

## 2017-10-12 MED ORDER — ALBUTEROL SULFATE HFA 108 (90 BASE) MCG/ACT IN AERS: 2.0000 | INHALATION_SPRAY | Freq: Four times a day (QID) | RESPIRATORY_TRACT | 0 refills | Status: DC | PRN

## 2017-10-12 NOTE — Telephone Encounter (Signed)
Please notify patient that referral has been made. thanks
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
## 2017-10-12 NOTE — Patient Instructions (Addendum)
1. Referral to physical therapy was made to [facility], [place], [place] [address] [phone] M-Th 7am - 6pm F 7am - 5pm    IF you received an x-ray today, you will receive an invoice from [facility]. Please contact [facility] at [phone] with questions or concerns regarding your invoice.   IF you received labwork today, you will receive an invoice from [facility]. Please contact [facility] at [phone] with questions or concerns regarding your invoice.   Our billing staff will not be able to assist you with questions regarding bills from these companies.  You will be contacted with the lab results as soon as they are available. The fastest way to get your results is to activate your My Chart account. Instructions are located on the last page of this paperwork. If you have not heard from [place] regarding the results in 2 weeks, please contact this office.

## 2017-10-14 NOTE — Telephone Encounter (Signed)
Patient informed. 

## 2017-10-22 ENCOUNTER — Ambulatory Visit: Payer: 59 | Admitting: Family Medicine

## 2017-10-25 ENCOUNTER — Encounter: Payer: Self-pay | Admitting: Physical Therapy

## 2017-10-25 ENCOUNTER — Ambulatory Visit: Payer: 59 | Attending: Family Medicine | Admitting: Physical Therapy

## 2017-10-25 DIAGNOSIS — M546 Pain in thoracic spine: Secondary | ICD-10-CM | POA: Diagnosis not present

## 2017-10-25 DIAGNOSIS — M545 Low back pain: Secondary | ICD-10-CM | POA: Diagnosis not present

## 2017-10-25 DIAGNOSIS — R252 Cramp and spasm: Secondary | ICD-10-CM

## 2017-10-25 DIAGNOSIS — M6281 Muscle weakness (generalized): Secondary | ICD-10-CM

## 2017-10-25 DIAGNOSIS — G8929 Other chronic pain: Secondary | ICD-10-CM | POA: Diagnosis not present

## 2017-10-25 NOTE — Patient Instructions (Signed)
BACK: Child's Pose (Sciatica)    Sit in knee-chest position and reach arms forward. Separate knees for comfort. Hold position for ___ breaths. Repeat ___ times. Do ___ times per day.  Copyright  VHI. All rights reserved.     THEN bring both hands to the Rt. And shift weight back over your heels then try the L side  Also try laying on your Rt. Side with a pillow and reach L arm up over your head and let L leg hang off the bed
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
## 2017-10-29 DIAGNOSIS — I10 Essential (primary) hypertension: Secondary | ICD-10-CM | POA: Diagnosis not present

## 2017-10-29 DIAGNOSIS — Z859 Personal history of malignant neoplasm, unspecified: Secondary | ICD-10-CM | POA: Diagnosis not present

## 2017-10-29 DIAGNOSIS — Z886 Allergy status to analgesic agent status: Secondary | ICD-10-CM | POA: Diagnosis not present

## 2017-10-29 DIAGNOSIS — R51 Headache: Secondary | ICD-10-CM | POA: Diagnosis not present

## 2017-10-29 DIAGNOSIS — G319 Degenerative disease of nervous system, unspecified: Secondary | ICD-10-CM | POA: Diagnosis not present

## 2017-11-02 ENCOUNTER — Other Ambulatory Visit: Payer: Self-pay

## 2017-11-02 ENCOUNTER — Ambulatory Visit: Payer: 59 | Admitting: Family Medicine

## 2017-11-02 ENCOUNTER — Encounter: Payer: Self-pay | Admitting: Family Medicine

## 2017-11-02 VITALS — BP 132/78 | HR 96 | Temp 98.2°F | Ht 63.78 in | Wt 185.2 lb

## 2017-11-02 DIAGNOSIS — I10 Essential (primary) hypertension: Secondary | ICD-10-CM

## 2017-11-02 NOTE — Progress Notes (Signed)
1/8/20195:38 PM  April Robinson [birth year], 57 y.o. female [id number]  Chief Complaint  Patient presents with  . Hypertension    ER VISIT WAS ON FRIDAY DUE TO BP,  [facility]    HPI:   Patient is a 57 y.o. female with past medical history significant for HTN managed with LFM who presents today for ER followup  She wokeup 330am with horrible headache At work bp 170/120s, sent to ER ER head ct scan, ekg and labs unremarkable Has been checking bp at home, 120-140/70-80s Has stopped all meds except for tamoxifen and dexilant She was doing ibuprofen daily due to back issues She had also been doing claritin-d recently Does not smoke nor drink etoh She has not been exercising of recent She does snore She had been very stressed that day  Depression screen [facility] 2/9 11/02/2017 10/12/2017 09/30/2017  Decreased Interest 0 0 0  Down, Depressed, Hopeless 1 0 0  PHQ - 2 Score 1 0 0    Allergies  Allergen Reactions  . Meloxicam Shortness Of Breath and Nausea Only  . Miconazole Nitrate Other (See Comments)    SWELLING OF SOFT PALATE THAT CAUSED DIFFICULTY SWALLOWING  . Latex Itching, Rash and Other (See Comments)    Fine, little bumps    Prior to Admission medications   Medication Sig Start Date End Date Taking? Authorizing Provider  albuterol (PROVENTIL HFA;VENTOLIN HFA) 108 (90 Base) MCG/ACT inhaler Inhale 2 puffs into the lungs every 6 (six) hours as needed for wheezing or shortness of breath. 10/12/17  Yes Rutherford Guys, MD  dexlansoprazole (DEXILANT) 60 MG capsule Take 1 capsule (60 mg total) by mouth daily. 06/21/17  Yes Roma Schanz R, DO  ibuprofen (ADVIL,MOTRIN) 800 MG tablet Take 1 tablet (800 mg total) by mouth 3 (three) times daily. 09/25/17  Yes Ward, Ozella Almond, PA-C  tiZANidine (ZANAFLEX) 4 MG tablet Take 1 tablet (4 mg total) by mouth every 6 (six) hours as needed for muscle spasms. 10/12/17  Yes Rutherford Guys, MD  tamoxifen (NOLVADEX) 20 MG  tablet TAKE 1 TABLET (20 MG TOTAL) BY MOUTH DAILY. 09/08/17   Magrinat, Virgie Dad, MD  valACYclovir (VALTREX) 500 MG tablet TAKE ONE TAB TWICE DAILY FOR 3-5 DAYS WITH OUTBREAK. Patient not taking: Reported on 11/02/2017 10/12/17   Rutherford Guys, MD    Past Medical History:  Diagnosis Date  . Alcoholism ([place])   . Anxiety   . Arthritis   . Cancer ([place])    breast L  . Esophageal reflux   . Hiatal hernia   . Hx of radiation therapy 08/27/11 to 10/15/11   L breast  . Hyperlipemia   . Hypertension    past not on any medications currently  . Personal history of colonic polyps    TUBULAR ADENOMA    Past Surgical History:  Procedure Laterality Date  . ABDOMINAL HYSTERECTOMY     partial for DUB  . BREAST LUMPECTOMY  07/23/11   left breast  . COLONOSCOPY    . LAPAROSCOPIC HYSTERECTOMY  1995    Social History   Tobacco Use  . Smoking status: Former Smoker    Types: Cigarettes    Last attempt to quit: 1983    Years since quitting: 36.0  . Smokeless tobacco: Never Used  Substance Use Topics  . Alcohol use: Yes    Alcohol/week: 0.0 oz    Comment: rare    Family History  Problem Relation Age of Onset  .  Breast cancer Mother   . Cancer Mother        breast  . Hyperlipidemia Mother   . Diabetes Father   . Hypertension Father   . Cancer Father   . Hyperlipidemia Father   . Cancer Brother        NEUROENDOCRINE  . Hypertension Brother   . Arthritis Brother        hips  . Colon cancer Neg Hx     Review of Systems  Constitutional: Negative for chills and fever.  Respiratory: Negative for cough and shortness of breath.   Cardiovascular: Negative for chest pain, palpitations and leg swelling.  Gastrointestinal: Negative for abdominal pain, nausea and vomiting.     OBJECTIVE:  Blood pressure 132/78, pulse 96, temperature 98.2 F (36.8 C), temperature source Oral, height 5' 3.78" (1.62 m), weight 185 lb 3.2 oz (84 kg), SpO2 96 %.  Physical Exam  Constitutional: She  is oriented to person, place, and time and well-developed, well-nourished, and in no distress.  HENT:  Head: Normocephalic and atraumatic.  Mouth/Throat: Oropharynx is clear and moist. No oropharyngeal exudate.  Eyes: EOM are normal. Pupils are equal, round, and reactive to light. No scleral icterus.  Neck: Neck supple.  Cardiovascular: Normal rate, regular rhythm and normal heart sounds. Exam reveals no gallop and no friction rub.  No murmur heard. Pulmonary/Chest: Effort normal and breath sounds normal. She has no wheezes. She has no rales.  Musculoskeletal: She exhibits no edema.  Neurological: She is alert and oriented to person, place, and time. Gait normal.  Skin: Skin is warm and dry.    ASSESSMENT and PLAN  1. Essential hypertension BP today < 140/90. Discussed checking BP every morning for next 2 weeks. Bring BP cuff to next visit with BP log. Routine ER precautions given.   Return in about 2 weeks (around 11/16/2017).    Rutherford Guys, MD Primary Care at [place], [place] [address] Ph.  [phone] Fax [phone]

## 2017-11-02 NOTE — Patient Instructions (Signed)
     IF you received an x-ray today, you will receive an invoice from [facility]. Please contact [facility] at [phone] with questions or concerns regarding your invoice.   IF you received labwork today, you will receive an invoice from [facility]. Please contact [facility] at [phone] with questions or concerns regarding your invoice.   Our billing staff will not be able to assist you with questions regarding bills from these companies.  You will be contacted with the lab results as soon as they are available. The fastest way to get your results is to activate your My Chart account. Instructions are located on the last page of this paperwork. If you have not heard from us regarding the results in 2 weeks, please contact this office.     

## 2017-11-03 ENCOUNTER — Ambulatory Visit: Payer: 59 | Attending: Family Medicine | Admitting: Physical Therapy

## 2017-11-03 DIAGNOSIS — M545 Low back pain: Secondary | ICD-10-CM | POA: Diagnosis not present

## 2017-11-03 DIAGNOSIS — R252 Cramp and spasm: Secondary | ICD-10-CM | POA: Insufficient documentation

## 2017-11-03 DIAGNOSIS — M546 Pain in thoracic spine: Secondary | ICD-10-CM | POA: Diagnosis present

## 2017-11-03 DIAGNOSIS — M6281 Muscle weakness (generalized): Secondary | ICD-10-CM | POA: Insufficient documentation

## 2017-11-03 DIAGNOSIS — G8929 Other chronic pain: Secondary | ICD-10-CM | POA: Diagnosis present

## 2017-11-04 ENCOUNTER — Encounter: Payer: Self-pay | Admitting: Physical Therapy

## 2017-11-04 NOTE — Therapy (Signed)
[place], [place], [address] Phone: [phone]   Fax:  [phone]  Physical Therapy Treatment  Patient Details  Name: April Robinson MRN: [id number] Date of Birth: 10/12/1961 Referring Provider: Dr. Grant Fontana    Encounter Date: 11/03/2017  PT End of Session - 11/04/17 1016    Visit Number  2    Number of Visits  12    Date for PT Re-Evaluation  12/06/17    PT Start Time  3818    PT Stop Time  1506    PT Time Calculation (min)  51 min    Activity Tolerance  Patient tolerated treatment well    Behavior During Therapy  [facility] for tasks assessed/performed       Past Medical History:  Diagnosis Date  . Alcoholism ([place])   . Anxiety   . Arthritis   . Cancer ([place])    breast L  . Esophageal reflux   . Hiatal hernia   . Hx of radiation therapy 08/27/11 to 10/15/11   L breast  . Hyperlipemia   . Hypertension    past not on any medications currently  . Personal history of colonic polyps    TUBULAR ADENOMA    Past Surgical History:  Procedure Laterality Date  . ABDOMINAL HYSTERECTOMY     partial for DUB  . BREAST LUMPECTOMY  07/23/11   left breast  . COLONOSCOPY    . LAPAROSCOPIC HYSTERECTOMY  1995    There were no vitals filed for this visit.  Subjective Assessment - 11/04/17 0928    Subjective  Patient reports she had a headahce last week. She went to the MD and her blood pressure was high. She has the same headache today. Her blood pressure was measured at 136/92. She will continue to monitor at home. The thought of needling makes her anixious. Therapy eill not needle today 2nd to borderline high blood pressure. She was advised if blodd pressure increase to contact MD.     Pertinent History  L Breast cancer, Rt. shoulder pain     Limitations  Lifting;Sitting;Reading;Other (comment);Standing;Walking;House hold activities    How long can you sit comfortably?  8 hours per day, would like to get  up every hour     How long can you stand comfortably?  right now she doesn't push it >15 min     How long can you walk comfortably?  has not done but could walk around the mall but had severe pain    Diagnostic tests  XR of shoulder     Patient Stated Goals  Patient would like to be her normal busy self.     Currently in Pain?  Yes    Pain Score  6     Pain Location  Back    Pain Orientation  Mid;Left    Pain Descriptors / Indicators  Spasm;Sore    Pain Type  Acute pain    Pain Onset  1 to 4 weeks ago    Aggravating Factors   movement, standing, walking     Pain Relieving Factors  medicine, slef massage     Effect of Pain on Daily Activities  uncomfortable at work                       [facility] Adult PT Treatment/Exercise - 11/04/17 0001      Lumbar Exercises: Standing   Other Standing Lumbar Exercises  shoulder extension yellow 2x10;  scap retraction 2x10 yellow     Other Standing Lumbar Exercises  saetd shoulder extternal rotation 2x10 yellow       Modalities   Modalities  Moist Heat      Moist Heat Therapy   Number Minutes Moist Heat  10 Minutes    Moist Heat Location  Lumbar Spine lumbo thoracic spine       Manual Therapy   Manual Therapy  Soft tissue mobilization;Joint mobilization    Joint Mobilization  gentle grade I and II PA  glides of the lower thoracic spine     Soft tissue mobilization  IASTYM to left sided lumbar paraspinals; sub occipital release              PT Education - 11/04/17 0938    Education provided  Yes    Education Details  reviewed HEP; improtance of softt tissue work     Forensic psychologist) Educated  Patient    Methods  Explanation;Demonstration;Tactile cues;Verbal cues    Comprehension  Verbalized understanding;Returned demonstration;Verbal cues required;Tactile cues required          PT Long Term Goals - 11/04/17 1026      PT LONG TERM GOAL #1   Title  Pt will be I with HEP for strength and posture    Time  6    Period  Weeks     Status  New      PT LONG TERM GOAL #2   Title  Pt will be able to sit as needed for work without needs for pain meds, not limited by spasms.     Time  6    Period  Weeks    Status  New      PT LONG TERM GOAL #3   Title  Pt will be able to lift and use UEs without increasing pain in back.    Time  6    Period  Weeks    Status  New      PT LONG TERM GOAL #4   Title  Pt will be able to demo good lifting and sitting posture to prevent reinjury.     Time  6    Period  Days    Status  New            Plan - 11/04/17 1024    Clinical Impression Statement  Patient decline dneedling 2nd to high blood pressure and anxiety about the procedure. She reported improved pain with soft tissue mobilization. She also reported an improved headache with manual sub-occipital release. She was advised to talk to her docotr if her B/P wnet up. She can measure it at home.     Clinical Presentation  Stable    Clinical Decision Making  Low    Rehab Potential  Excellent    PT Frequency  2x / week    PT Duration  6 weeks    PT Treatment/Interventions  ADLs/Self Care Home Management;Cryotherapy;Therapeutic exercise;Therapeutic activities;Functional mobility training;Neuromuscular re-education;Patient/family education;Manual techniques;Taping;Dry needling;Iontophoresis 4mg /ml Dexamethasone;Moist Heat    PT Next Visit Plan  manual, postural training, standing UE/posture (upper back strength), check HEP    PT Home Exercise Plan  QL/trunk stretch and childs pose     Consulted and Agree with Plan of Care  Patient       Patient will benefit from skilled therapeutic intervention in order to improve the following deficits and impairments:  Decreased mobility, Obesity, Improper body mechanics, Postural dysfunction, Pain, Impaired UE functional use, Impaired flexibility, Increased  fascial restricitons, Decreased strength, Decreased activity tolerance, Decreased range of motion  Visit Diagnosis: Chronic  left-sided low back pain, with sciatica presence unspecified  Pain in thoracic spine  Cramp and spasm  Muscle weakness (generalized)     Problem List Patient Active Problem List   Diagnosis Date Noted  . Tendonitis, Achilles, right 08/05/2017  . Left knee pain 04/23/2015  . Knee pain, left 04/11/2015  . Popliteal pain 04/11/2015  . Hypertension 10/17/2014  . Acute pharyngitis 09/04/2014  . Left shoulder pain 02/19/2014  . Benign paroxysmal positional vertigo 09/29/2013  . Hot flashes due to tamoxifen 08/16/2013  . Hx of vaginitis 04/15/2012  . De Quervain's disease (tenosynovitis) 04/05/2012  . Breast cancer of upper-outer quadrant of left female breast ([place])   . IBS 12/03/2010  . ELEVATED BP READING WITHOUT DX HYPERTENSION 12/03/2010  . NECK SPRAIN AND STRAIN 12/03/2010  . ANXIETY STATE, UNSPECIFIED 11/07/2010  . ADD 11/07/2010  . GERD 11/07/2010  . CHEST PAIN UNSPECIFIED 11/07/2010    Carney Living PT DPT  11/04/2017, 10:29 AM  [facility] [address] [place], [place], [address] Phone: [phone]   Fax:  [phone]  Name: SHAMERIA TRIMARCO MRN: [id number] Date of Birth: [birth year]

## 2017-11-09 ENCOUNTER — Ambulatory Visit: Payer: 59 | Admitting: Physical Therapy

## 2017-11-09 VITALS — BP 155/98

## 2017-11-09 DIAGNOSIS — M6281 Muscle weakness (generalized): Secondary | ICD-10-CM

## 2017-11-09 DIAGNOSIS — R252 Cramp and spasm: Secondary | ICD-10-CM

## 2017-11-09 DIAGNOSIS — M546 Pain in thoracic spine: Secondary | ICD-10-CM

## 2017-11-09 DIAGNOSIS — G8929 Other chronic pain: Secondary | ICD-10-CM

## 2017-11-09 DIAGNOSIS — M545 Low back pain: Secondary | ICD-10-CM | POA: Diagnosis not present

## 2017-11-09 NOTE — Patient Instructions (Signed)
Over Head Pull: Narrow Grip       On back, knees bent, feet flat, band across thighs, elbows straight but relaxed. Pull hands apart (start). Keeping elbows straight, bring arms up and over head, hands toward floor. Keep pull steady on band. Hold momentarily. Return slowly, keeping pull steady, back to start. Repeat __10_ times. Band color _____yellow_   Side Pull: Double Arm   On back, knees bent, feet flat. Arms perpendicular to body, shoulder level, elbows straight but relaxed. Pull arms out to sides, elbows straight. Resistance band comes across collarbones, hands toward floor. Hold momentarily. Slowly return to starting position. Repeat __10_ times. Band color __Yellow___   Sash   On back, knees bent, feet flat, left hand on left hip, right hand above left. Pull right arm DIAGONALLY (hip to shoulder) across chest. Bring right arm along head toward floor. Hold momentarily. Slowly return to starting position. Repeat _10__ times. Do with left arm. Band color ______Yellow    Shoulder Rotation: Double Arm   On back, knees bent, feet flat, elbows tucked at sides, bent 90, hands palms up. Pull hands apart and down toward floor, keeping elbows near sides. Hold momentarily. Slowly return to starting position. Repeat ___ times. Band color ______

## 2017-11-09 NOTE — Therapy (Signed)
[place], [place], [address] Phone: [phone]   Fax:  [phone]  Physical Therapy Treatment  Patient Details  Name: April Robinson MRN: [id number] Date of Birth: 1960-12-26 Referring Provider: Dr. Grant Fontana    Encounter Date: 11/09/2017  PT End of Session - 11/09/17 1608    Visit Number  3    Number of Visits  12    Date for PT Re-Evaluation  12/06/17    PT Start Time  8502    PT Stop Time  1630    PT Time Calculation (min)  45 min    Activity Tolerance  Patient tolerated treatment well    Behavior During Therapy  [facility] for tasks assessed/performed       Past Medical History:  Diagnosis Date  . Alcoholism ([place])   . Anxiety   . Arthritis   . Cancer ([place])    breast L  . Esophageal reflux   . Hiatal hernia   . Hx of radiation therapy 08/27/11 to 10/15/11   L breast  . Hyperlipemia   . Hypertension    past not on any medications currently  . Personal history of colonic polyps    TUBULAR ADENOMA    Past Surgical History:  Procedure Laterality Date  . ABDOMINAL HYSTERECTOMY     partial for DUB  . BREAST LUMPECTOMY  07/23/11   left breast  . COLONOSCOPY    . LAPAROSCOPIC HYSTERECTOMY  1995    Vitals:   11/09/17 1551  BP: (!) 155/98    Subjective Assessment - 11/09/17 1552    Subjective  Was in ED for BP and headache.  No headache today, BP elevated.  Back hurts alot today.  Line dancing Friday night.      Currently in Pain?  Yes    Pain Score  7     Pain Location  Back    Pain Orientation  Mid;Left    Pain Descriptors / Indicators  Tightness    Pain Type  Acute pain    Pain Onset  More than a month ago    Pain Frequency  Intermittent           [facility] Adult PT Treatment/Exercise - 11/09/17 0001      Lumbar Exercises: Seated   Other Seated Lumbar Exercises  thoracic ext over roll, scap protraction , rhomboid stretch       Lumbar Exercises: Supine   Other Supine Lumbar  Exercises  diagonal pull x 10 and horizontal pull x 10     Other Supine Lumbar Exercises  supine overhead scapular stab yelow band narrow grip pull x 10       Lumbar Exercises: Sidelying   Other Sidelying Lumbar Exercises  sidelying L trunk stretch       Lumbar Exercises: Quadruped   Other Quadruped Lumbar Exercises  childs pose , 30 sec FW and lateral       Shoulder Exercises: Standing   External Rotation  Strengthening;Both;15 reps    Theraband Level (Shoulder External Rotation)  Level 1 (Yellow)    Extension  Strengthening;Both;15 reps    Theraband Level (Shoulder Extension)  Level 2 (Red)    Row  Strengthening;Both;15 reps    Theraband Level (Shoulder Row)  Level 2 (Red)      Manual Therapy   Manual Therapy  Soft tissue mobilization    Manual therapy comments  large knot along lateral border scapula, lower trap used bioofreeze post session  Soft tissue mobilization  L thoracic paraspinals              PT Education - 11/09/17 1606    Education provided  Yes    Education Details  HEP, continued strengthening, spinal ext , scap stab    Person(s) Educated  Patient    Methods  Explanation;Handout    Comprehension  Verbalized understanding;Returned demonstration;Tactile cues required          PT Long Term Goals - 11/04/17 1026      PT LONG TERM GOAL #1   Title  Pt will be I with HEP for strength and posture    Time  6    Period  Weeks    Status  New      PT LONG TERM GOAL #2   Title  Pt will be able to sit as needed for work without needs for pain meds, not limited by spasms.     Time  6    Period  Weeks    Status  New      PT LONG TERM GOAL #3   Title  Pt will be able to lift and use UEs without increasing pain in back.    Time  6    Period  Weeks    Status  New      PT LONG TERM GOAL #4   Title  Pt will be able to demo good lifting and sitting posture to prevent reinjury.     Time  6    Period  Days    Status  New            Plan - 11/09/17  1633    Clinical Impression Statement  Pt with increased pain today, BPs running borderline, 142/90 post manual work in [place].  Pain decreased to about 3/10 .  Gave her scapular stabilization ex but she may not do them tonight as she is planning to go to line dancing class.     PT Treatment/Interventions  ADLs/Self Care Home Management;Cryotherapy;Therapeutic exercise;Therapeutic activities;Functional mobility training;Neuromuscular re-education;Patient/family education;Manual techniques;Taping;Dry needling;Iontophoresis 4mg /ml Dexamethasone;Moist Heat    PT Next Visit Plan  manual, postural training, standing UE/posture (upper back strength), check HEP    PT Home Exercise Plan  QL/trunk stretch and childs pose , supine scapular stabilization     Consulted and Agree with Plan of Care  Patient       Patient will benefit from skilled therapeutic intervention in order to improve the following deficits and impairments:  Decreased mobility, Obesity, Improper body mechanics, Postural dysfunction, Pain, Impaired UE functional use, Impaired flexibility, Increased fascial restricitons, Decreased strength, Decreased activity tolerance, Decreased range of motion  Visit Diagnosis: Chronic left-sided low back pain, with sciatica presence unspecified  Pain in thoracic spine  Cramp and spasm  Muscle weakness (generalized)     Problem List Patient Active Problem List   Diagnosis Date Noted  . Tendonitis, Achilles, right 08/05/2017  . Left knee pain 04/23/2015  . Knee pain, left 04/11/2015  . Popliteal pain 04/11/2015  . Hypertension 10/17/2014  . Acute pharyngitis 09/04/2014  . Left shoulder pain 02/19/2014  . Benign paroxysmal positional vertigo 09/29/2013  . Hot flashes due to tamoxifen 08/16/2013  . Hx of vaginitis 04/15/2012  . De Quervain's disease (tenosynovitis) 04/05/2012  . Breast cancer of upper-outer quadrant of left female breast ([place])   . IBS 12/03/2010  . ELEVATED BP READING  WITHOUT DX HYPERTENSION 12/03/2010  . NECK SPRAIN AND STRAIN 12/03/2010  .  ANXIETY STATE, UNSPECIFIED 11/07/2010  . ADD 11/07/2010  . GERD 11/07/2010  . CHEST PAIN UNSPECIFIED 11/07/2010    Constant Mandeville 11/09/2017, 4:35 PM  [facility] [address] [place], [place], [address] Phone: [phone]   Fax:  [phone]  Name: NACOLE FLUHR MRN: [id number] Date of Birth: January 09, 1961   Raeford Razor, PT 11/09/17 4:36 PM Phone: [phone] Fax: [phone]

## 2017-11-10 ENCOUNTER — Ambulatory Visit: Payer: 59 | Admitting: Physical Therapy

## 2017-11-10 DIAGNOSIS — M6281 Muscle weakness (generalized): Secondary | ICD-10-CM

## 2017-11-10 DIAGNOSIS — M546 Pain in thoracic spine: Secondary | ICD-10-CM

## 2017-11-10 DIAGNOSIS — M545 Low back pain: Principal | ICD-10-CM

## 2017-11-10 DIAGNOSIS — G8929 Other chronic pain: Secondary | ICD-10-CM

## 2017-11-10 DIAGNOSIS — R252 Cramp and spasm: Secondary | ICD-10-CM

## 2017-11-11 ENCOUNTER — Encounter: Payer: Self-pay | Admitting: Physical Therapy

## 2017-11-11 NOTE — Therapy (Signed)
[place], [place], [address] Phone: [phone]   Fax:  [phone]  Physical Therapy Treatment  Patient Details  Name: April Robinson MRN: [id number] Date of Birth: 1960/11/18 Referring Provider: Dr. Grant Fontana    Encounter Date: 11/10/2017  PT End of Session - 11/10/17 1642    Visit Number  4    Number of Visits  12    Date for PT Re-Evaluation  12/06/17    Authorization Type  [employer] health care     PT Start Time  3151    PT Stop Time  7616    PT Time Calculation (min)  44 min    Activity Tolerance  Patient tolerated treatment well    Behavior During Therapy  [facility] for tasks assessed/performed       Past Medical History:  Diagnosis Date  . Alcoholism ([place])   . Anxiety   . Arthritis   . Cancer ([place])    breast L  . Esophageal reflux   . Hiatal hernia   . Hx of radiation therapy 08/27/11 to 10/15/11   L breast  . Hyperlipemia   . Hypertension    past not on any medications currently  . Personal history of colonic polyps    TUBULAR ADENOMA    Past Surgical History:  Procedure Laterality Date  . ABDOMINAL HYSTERECTOMY     partial for DUB  . BREAST LUMPECTOMY  07/23/11   left breast  . COLONOSCOPY    . LAPAROSCOPIC HYSTERECTOMY  1995    There were no vitals filed for this visit.  Subjective Assessment - 11/10/17 1639    Subjective  Patient reports she felt much better after her treatment yesterday. She went line dancing tlast night and is not having as much pain today.     Pertinent History  L Breast cancer, Rt. shoulder pain     Limitations  Lifting;Sitting;Reading;Other (comment);Standing;Walking;House hold activities    How long can you sit comfortably?  8 hours per day, would like to get up every hour     How long can you stand comfortably?  right now she doesn't push it >15 min     How long can you walk comfortably?  has not done but could walk around the mall but had severe pain     Diagnostic tests  XR of shoulder     Currently in Pain?  Yes    Pain Score  7     Pain Location  Back    Pain Orientation  Left;Mid    Pain Descriptors / Indicators  Tightness    Pain Type  Acute pain    Pain Radiating Towards  mid back pain and left shoulder blade     Pain Onset  More than a month ago    Pain Frequency  Intermittent    Aggravating Factors   movement, standing, walking     Pain Relieving Factors  medicine, self massage     Effect of Pain on Daily Activities  uncomfortable at work                      [facility] Adult PT Treatment/Exercise - 11/11/17 0001      Lumbar Exercises: Standing   Other Standing Lumbar Exercises  shoulder extension yellow 2x10; scap retraction 2x10 yellow     Other Standing Lumbar Exercises  seatd shoulder extternal rotation 2x10 yellow       Lumbar Exercises: Supine  Other Supine Lumbar Exercises  supine overhead scapular stab yelow band narrow grip pull x 10       Moist Heat Therapy   Number Minutes Moist Heat  10 Minutes    Moist Heat Location  Lumbar Spine      Manual Therapy   Manual Therapy  Soft tissue mobilization    Manual therapy comments  skilled palpation of trigger points     Soft tissue mobilization  IASTYM to left sided lumbar paraspinals;              PT Education - 11/10/17 1640    Education provided  Yes    Education Details  benefits and risks of TPDN     Person(s) Educated  Patient    Methods  Explanation;Handout    Comprehension  Verbalized understanding;Returned demonstration;Verbal cues required;Tactile cues required          PT Long Term Goals - 11/11/17 1224      PT LONG TERM GOAL #1   Title  Pt will be I with HEP for strength and posture    Time  6    Period  Weeks    Status  On-going      PT LONG TERM GOAL #2   Title  Pt will be able to sit as needed for work without needs for pain meds, not limited by spasms.     Time  6    Period  Weeks    Status  On-going      PT  LONG TERM GOAL #3   Title  Pt will be able to lift and use UEs without increasing pain in back.    Time  6    Period  Weeks    Status  On-going      PT LONG TERM GOAL #4   Title  Pt will be able to demo good lifting and sitting posture to prevent reinjury.     Time  6    Period  Weeks    Status  On-going            Plan - 11/11/17 1217    Clinical Impression Statement  Patient agreed to trigger point dry needling. She had good twitch resposes along the t-7 t-8 and t-6  spinal segments. Therapy educated her on how to perfrom self soft tissue mobilization to reduce post needle soreness. Patient perfromed exercises and stretches without increased pain.     Clinical Presentation  Stable    Clinical Decision Making  Low    Rehab Potential  Excellent    PT Frequency  2x / week    PT Duration  6 weeks    PT Treatment/Interventions  ADLs/Self Care Home Management;Cryotherapy;Therapeutic exercise;Therapeutic activities;Functional mobility training;Neuromuscular re-education;Patient/family education;Manual techniques;Taping;Dry needling;Iontophoresis 4mg /ml Dexamethasone;Moist Heat    PT Next Visit Plan  manual, postural training, standing UE/posture (upper back strength), check HEP    PT Home Exercise Plan  QL/trunk stretch and childs pose , supine scapular stabilization     Consulted and Agree with Plan of Care  Patient       Patient will benefit from skilled therapeutic intervention in order to improve the following deficits and impairments:     Visit Diagnosis: Chronic left-sided low back pain, with sciatica presence unspecified  Pain in thoracic spine  Cramp and spasm  Muscle weakness (generalized)     Problem List Patient Active Problem List   Diagnosis Date Noted  . Tendonitis, Achilles, right 08/05/2017  . Left  knee pain 04/23/2015  . Knee pain, left 04/11/2015  . Popliteal pain 04/11/2015  . Hypertension 10/17/2014  . Acute pharyngitis 09/04/2014  . Left  shoulder pain 02/19/2014  . Benign paroxysmal positional vertigo 09/29/2013  . Hot flashes due to tamoxifen 08/16/2013  . Hx of vaginitis 04/15/2012  . De Quervain's disease (tenosynovitis) 04/05/2012  . Breast cancer of upper-outer quadrant of left female breast ([place])   . IBS 12/03/2010  . ELEVATED BP READING WITHOUT DX HYPERTENSION 12/03/2010  . NECK SPRAIN AND STRAIN 12/03/2010  . ANXIETY STATE, UNSPECIFIED 11/07/2010  . ADD 11/07/2010  . GERD 11/07/2010  . CHEST PAIN UNSPECIFIED 11/07/2010    Carney Living PT DPT  11/11/2017, 12:45 PM  [facility] [address] [place], [place], [address] Phone: [phone]   Fax:  [phone]  Name: April Robinson MRN: [id number] Date of Birth: [birth year]

## 2017-11-16 ENCOUNTER — Ambulatory Visit: Payer: 59 | Admitting: Physical Therapy

## 2017-11-16 ENCOUNTER — Other Ambulatory Visit: Payer: Self-pay | Admitting: Family Medicine

## 2017-11-16 ENCOUNTER — Encounter: Payer: Self-pay | Admitting: Physical Therapy

## 2017-11-16 DIAGNOSIS — R252 Cramp and spasm: Secondary | ICD-10-CM

## 2017-11-16 DIAGNOSIS — M545 Low back pain: Principal | ICD-10-CM

## 2017-11-16 DIAGNOSIS — M6281 Muscle weakness (generalized): Secondary | ICD-10-CM

## 2017-11-16 DIAGNOSIS — M546 Pain in thoracic spine: Secondary | ICD-10-CM

## 2017-11-16 DIAGNOSIS — G8929 Other chronic pain: Secondary | ICD-10-CM

## 2017-11-16 NOTE — Therapy (Signed)
[place], [place], [address] Phone: [phone]   Fax:  [phone]  Physical Therapy Treatment  Patient Details  Name: April Robinson MRN: [id number] Date of Birth: Aug 25, 1961 Referring Provider: Dr. Grant Fontana    Encounter Date: 11/16/2017  PT End of Session - 11/16/17 1519    Visit Number  5    Number of Visits  12    Date for PT Re-Evaluation  12/06/17    PT Start Time  1512    PT Stop Time  1600    PT Time Calculation (min)  48 min    Activity Tolerance  Patient tolerated treatment well    Behavior During Therapy  [facility] for tasks assessed/performed       Past Medical History:  Diagnosis Date  . Alcoholism ([place])   . Anxiety   . Arthritis   . Cancer ([place])    breast L  . Esophageal reflux   . Hiatal hernia   . Hx of radiation therapy 08/27/11 to 10/15/11   L breast  . Hyperlipemia   . Hypertension    past not on any medications currently  . Personal history of colonic polyps    TUBULAR ADENOMA    Past Surgical History:  Procedure Laterality Date  . ABDOMINAL HYSTERECTOMY     partial for DUB  . BREAST LUMPECTOMY  07/23/11   left breast  . COLONOSCOPY    . LAPAROSCOPIC HYSTERECTOMY  1995    There were no vitals filed for this visit.  Subjective Assessment - 11/16/17 1517    Subjective  The needling didn't make any difference.  Put away some things in her cabinets up high and down low and squatting and maybe that's why I'm hurting more.  Was over the weekend.     Currently in Pain?  Yes    Pain Score  4     Pain Location  Back    Pain Orientation  Left;Mid    Pain Descriptors / Indicators  Sore    Pain Onset  More than a month ago    Pain Frequency  Intermittent            [facility] Adult PT Treatment/Exercise - 11/16/17 0001      Lumbar Exercises: Aerobic   Nustep  6 min UE and LE L 6       Lumbar Exercises: Standing   Wall Slides  10 reps angel arms (W to V) x 10 as  able     Row  Strengthening;Both;20 reps;Theraband    Theraband Level (Row)  Level 4 (Blue)    Other Standing Lumbar Exercises  standing scapular stabiization red band x 10 each : narrow grip, horizontal pull, diagonal pull, ER/IR     Other Standing Lumbar Exercises  extension with foam roller x 10       Lumbar Exercises: Quadruped   Other Quadruped Lumbar Exercises  childs pose , 30 sec FW and lateral       Moist Heat Therapy   Number Minutes Moist Heat  10 Minutes    Moist Heat Location  Lumbar Spine      Manual Therapy   Manual Therapy  Soft tissue mobilization    Manual therapy comments  L trunk, QL, paraspinals, ribcage/intercostals and periscapular    Joint Mobilization  rotational mobs to lower thoracic     Soft tissue mobilization  L thoracic paraspinals  PT Education - 11/16/17 2057    Education provided  Yes    Education Details  standing posture, band exercises     Person(s) Educated  Patient    Methods  Explanation;Demonstration;Verbal cues    Comprehension  Verbalized understanding;Returned demonstration          PT Long Term Goals - 11/11/17 1224      PT LONG TERM GOAL #1   Title  Pt will be I with HEP for strength and posture    Time  6    Period  Weeks    Status  On-going      PT LONG TERM GOAL #2   Title  Pt will be able to sit as needed for work without needs for pain meds, not limited by spasms.     Time  6    Period  Weeks    Status  On-going      PT LONG TERM GOAL #3   Title  Pt will be able to lift and use UEs without increasing pain in back.    Time  6    Period  Weeks    Status  On-going      PT LONG TERM GOAL #4   Title  Pt will be able to demo good lifting and sitting posture to prevent reinjury.     Time  6    Period  Weeks    Status  On-going            Plan - 11/16/17 1529    Clinical Impression Statement  Able to advance to standing scapular stabilization exercises.  She had increase in pain due to more  activity than usual.  She continues to go to dance class (went last Tuesday).  Vey tender and sore along entire L trunk from iliac crest, proximally along ribcage to medial scapular mm.  Released with stretching, manual and heat.     PT Treatment/Interventions  ADLs/Self Care Home Management;Cryotherapy;Therapeutic exercise;Therapeutic activities;Functional mobility training;Neuromuscular re-education;Patient/family education;Manual techniques;Taping;Dry needling;Iontophoresis 4mg /ml Dexamethasone;Moist Heat    PT Next Visit Plan  try booke openings, sidebnding over bolster, manual, postural training, standing UE/posture (upper back strength), check HEP    PT Home Exercise Plan  QL/trunk stretch and childs pose , supine scapular stabilization     Consulted and Agree with Plan of Care  Patient       Patient will benefit from skilled therapeutic intervention in order to improve the following deficits and impairments:  Decreased mobility, Obesity, Improper body mechanics, Postural dysfunction, Pain, Impaired UE functional use, Impaired flexibility, Increased fascial restricitons, Decreased strength, Decreased activity tolerance, Decreased range of motion  Visit Diagnosis: Chronic left-sided low back pain, with sciatica presence unspecified  Pain in thoracic spine  Cramp and spasm  Muscle weakness (generalized)     Problem List Patient Active Problem List   Diagnosis Date Noted  . Tendonitis, Achilles, right 08/05/2017  . Left knee pain 04/23/2015  . Knee pain, left 04/11/2015  . Popliteal pain 04/11/2015  . Hypertension 10/17/2014  . Acute pharyngitis 09/04/2014  . Left shoulder pain 02/19/2014  . Benign paroxysmal positional vertigo 09/29/2013  . Hot flashes due to tamoxifen 08/16/2013  . Hx of vaginitis 04/15/2012  . De Quervain's disease (tenosynovitis) 04/05/2012  . Breast cancer of upper-outer quadrant of left female breast ([place])   . IBS 12/03/2010  . ELEVATED BP READING  WITHOUT DX HYPERTENSION 12/03/2010  . NECK SPRAIN AND STRAIN 12/03/2010  . ANXIETY STATE, UNSPECIFIED 11/07/2010  .  ADD 11/07/2010  . GERD 11/07/2010  . CHEST PAIN UNSPECIFIED 11/07/2010    , 11/16/2017, 9:03 PM  [facility] [address] [place], [place], [address] Phone: [phone]   Fax:  [phone]  Name: April Robinson MRN: [id number] Date of Birth: [birth year]  Raeford Razor, PT 11/16/17 9:03 PM Phone: [phone] Fax: [phone]

## 2017-11-17 ENCOUNTER — Ambulatory Visit: Payer: 59 | Admitting: Physical Therapy

## 2017-11-17 DIAGNOSIS — M6281 Muscle weakness (generalized): Secondary | ICD-10-CM

## 2017-11-17 DIAGNOSIS — M545 Low back pain: Principal | ICD-10-CM

## 2017-11-17 DIAGNOSIS — G8929 Other chronic pain: Secondary | ICD-10-CM

## 2017-11-17 DIAGNOSIS — R252 Cramp and spasm: Secondary | ICD-10-CM

## 2017-11-17 DIAGNOSIS — M546 Pain in thoracic spine: Secondary | ICD-10-CM

## 2017-11-18 ENCOUNTER — Ambulatory Visit: Payer: 59 | Admitting: Family Medicine

## 2017-11-18 ENCOUNTER — Other Ambulatory Visit: Payer: Self-pay

## 2017-11-18 ENCOUNTER — Encounter: Payer: Self-pay | Admitting: Physical Therapy

## 2017-11-18 ENCOUNTER — Encounter: Payer: Self-pay | Admitting: Family Medicine

## 2017-11-18 VITALS — BP 138/80 | HR 80 | Temp 98.0°F | Ht 62.0 in | Wt 187.8 lb

## 2017-11-18 DIAGNOSIS — M549 Dorsalgia, unspecified: Secondary | ICD-10-CM | POA: Diagnosis not present

## 2017-11-18 DIAGNOSIS — I10 Essential (primary) hypertension: Secondary | ICD-10-CM

## 2017-11-18 MED ORDER — CYCLOBENZAPRINE HCL 10 MG PO TABS: 10.0000 mg | ORAL_TABLET | Freq: Every day | ORAL | 0 refills | Status: DC

## 2017-11-18 MED ORDER — AMLODIPINE BESYLATE 2.5 MG PO TABS: 2.5000 mg | ORAL_TABLET | Freq: Every day | ORAL | 1 refills | Status: DC

## 2017-11-18 NOTE — Therapy (Signed)
[name], [place], [address] Phone: [phone]   Fax:  [phone]  Physical Therapy Treatment  Patient Details  Name: April Robinson MRN: [id number] Date of Birth: August 25, 1961 Referring Provider: Dr. Grant Fontana    Encounter Date: 11/17/2017  PT End of Session - 11/18/17 1530    Visit Number  6    Number of Visits  12    Date for PT Re-Evaluation  12/06/17    Authorization Type  United health care     PT Start Time  1630    PT Stop Time  1710    PT Time Calculation (min)  40 min    Activity Tolerance  Patient tolerated treatment well    Behavior During Therapy  [facility] for tasks assessed/performed       Past Medical History:  Diagnosis Date  . Alcoholism ([place])   . Anxiety   . Arthritis   . Cancer ([place])    breast L  . Esophageal reflux   . Hiatal hernia   . Hx of radiation therapy 08/27/11 to 10/15/11   L breast  . Hyperlipemia   . Hypertension    past not on any medications currently  . Personal history of colonic polyps    TUBULAR ADENOMA    Past Surgical History:  Procedure Laterality Date  . ABDOMINAL HYSTERECTOMY     partial for DUB  . BREAST LUMPECTOMY  07/23/11   left breast  . COLONOSCOPY    . LAPAROSCOPIC HYSTERECTOMY  1995    There were no vitals filed for this visit.  Subjective Assessment - 11/18/17 1528    Subjective  Patient reports no improvement at this time. She continues to have pain.     Pertinent History  L Breast cancer, Rt. shoulder pain     Limitations  Lifting;Sitting;Reading;Other (comment);Standing;Walking;House hold activities    How long can you sit comfortably?  8 hours per day, would like to get up every hour     How long can you stand comfortably?  right now she doesn't push it >15 min     How long can you walk comfortably?  has not done but could walk around the mall but had severe pain    Diagnostic tests  XR of shoulder     Patient Stated Goals   Patient would like to be her normal busy self.     Currently in Pain?  Yes    Pain Score  5     Pain Location  Back    Pain Orientation  Left    Pain Descriptors / Indicators  Sore    Pain Onset  More than a month ago    Pain Frequency  Intermittent    Aggravating Factors   movement; standing; walking     Pain Relieving Factors  medicine;l self massage                       [facility] Adult PT Treatment/Exercise - 11/18/17 0001      Lumbar Exercises: Quadruped   Other Quadruped Lumbar Exercises  childs pose , 30 sec FW and lateral     Other Quadruped Lumbar Exercises  lateral prayer stretch 3x20 sec hold; open book stretch; pain when returning from stretch x10 with left arm;       Shoulder Exercises: Standing   Extension  Strengthening;Both;15 reps    Theraband Level (Shoulder Extension)  Level 2 (Red)  Row  Strengthening;Both;15 reps    Theraband Level (Shoulder Row)  Level 2 (Red)      Manual Therapy   Manual Therapy  Soft tissue mobilization    Manual therapy comments  L trunk, QL, paraspinals, ribcage/intercostals and periscapular    Joint Mobilization  rotational mobs to lower thoracic     Soft tissue mobilization  L thoracic paraspinals              PT Education - 11/18/17 1529    Education provided  Yes    Education Details  standing posture, band exercises     Person(s) Educated  Patient    Methods  Explanation;Demonstration;Tactile cues;Verbal cues    Comprehension  Verbalized understanding;Returned demonstration;Verbal cues required;Tactile cues required          PT Long Term Goals - 11/11/17 1224      PT LONG TERM GOAL #1   Title  Pt will be I with HEP for strength and posture    Time  6    Period  Weeks    Status  On-going      PT LONG TERM GOAL #2   Title  Pt will be able to sit as needed for work without needs for pain meds, not limited by spasms.     Time  6    Period  Weeks    Status  On-going      PT LONG TERM GOAL #3    Title  Pt will be able to lift and use UEs without increasing pain in back.    Time  6    Period  Weeks    Status  On-going      PT LONG TERM GOAL #4   Title  Pt will be able to demo good lifting and sitting posture to prevent reinjury.     Time  6    Period  Weeks    Status  On-going            Plan - 11/18/17 1530    Clinical Impression Statement  Therapy added ultrasound today. At this time nothing has given the patient lasted relief. Massage helps for a short period of time. Her pain today was foced around her lower rib cage/quadratus area. She had no reief in pain with quadratus stretching. She may have some type of lower rib injury. Therapy used a tuning fork on her ribs before perfroming ultrasound. She had no pain with tunning fork. If the patient has lasting improvement from ultrasound she may continue with therapy. If she dosent she will likley D/C to HEP.     Clinical Presentation  Stable    Clinical Decision Making  Low    Rehab Potential  Excellent    PT Frequency  2x / week    PT Duration  6 weeks    PT Treatment/Interventions  ADLs/Self Care Home Management;Cryotherapy;Therapeutic exercise;Therapeutic activities;Functional mobility training;Neuromuscular re-education;Patient/family education;Manual techniques;Taping;Dry needling;Iontophoresis 4mg /ml Dexamethasone;Moist Heat    PT Next Visit Plan  try booke openings, sidebnding over bolster, manual, postural training, standing UE/posture (upper back strength), check HEP    PT Home Exercise Plan  QL/trunk stretch and childs pose , supine scapular stabilization     Consulted and Agree with Plan of Care  Patient       Patient will benefit from skilled therapeutic intervention in order to improve the following deficits and impairments:  Decreased mobility, Obesity, Improper body mechanics, Postural dysfunction, Pain, Impaired UE functional use, Impaired flexibility, Increased  fascial restricitons, Decreased strength,  Decreased activity tolerance, Decreased range of motion  Visit Diagnosis: Chronic left-sided low back pain, with sciatica presence unspecified  Pain in thoracic spine  Cramp and spasm  Muscle weakness (generalized)     Problem List Patient Active Problem List   Diagnosis Date Noted  . Tendonitis, Achilles, right 08/05/2017  . Left knee pain 04/23/2015  . Knee pain, left 04/11/2015  . Popliteal pain 04/11/2015  . Hypertension 10/17/2014  . Acute pharyngitis 09/04/2014  . Left shoulder pain 02/19/2014  . Benign paroxysmal positional vertigo 09/29/2013  . Hot flashes due to tamoxifen 08/16/2013  . Hx of vaginitis 04/15/2012  . De Quervain's disease (tenosynovitis) 04/05/2012  . Breast cancer of upper-outer quadrant of left female breast ([place])   . IBS 12/03/2010  . ELEVATED BP READING WITHOUT DX HYPERTENSION 12/03/2010  . NECK SPRAIN AND STRAIN 12/03/2010  . ANXIETY STATE, UNSPECIFIED 11/07/2010  . ADD 11/07/2010  . GERD 11/07/2010  . CHEST PAIN UNSPECIFIED 11/07/2010    Carney Living PT DPT  11/18/2017, 3:36 PM  [facility] [address] [place], [place], [address] Phone: [phone]   Fax:  [phone]  Name: LEKEYA ROLLINGS MRN: [id number] Date of Birth: 09/29/1961

## 2017-11-18 NOTE — Patient Instructions (Addendum)
1. Restart flexeril at bedtime 2. Cont checking your BP, goal is 140/90 or less   IF you received an x-ray today, you will receive an invoice from [facility]. Please contact [facility] at [phone] with questions or concerns regarding your invoice.   IF you received labwork today, you will receive an invoice from [facility]. Please contact [facility] at [phone] with questions or concerns regarding your invoice.   Our billing staff will not be able to assist you with questions regarding bills from these companies.  You will be contacted with the lab results as soon as they are available. The fastest way to get your results is to activate your My Chart account. Instructions are located on the last page of this paperwork. If you have not heard from [place] regarding the results in 2 weeks, please contact this office.
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
## 2017-11-23 ENCOUNTER — Encounter: Payer: Self-pay | Admitting: Physical Therapy

## 2017-11-23 ENCOUNTER — Ambulatory Visit: Payer: 59 | Admitting: Physical Therapy

## 2017-11-23 DIAGNOSIS — M545 Low back pain: Secondary | ICD-10-CM

## 2017-11-23 DIAGNOSIS — M546 Pain in thoracic spine: Secondary | ICD-10-CM

## 2017-11-23 DIAGNOSIS — G8929 Other chronic pain: Secondary | ICD-10-CM

## 2017-11-23 DIAGNOSIS — R252 Cramp and spasm: Secondary | ICD-10-CM

## 2017-11-23 DIAGNOSIS — M6281 Muscle weakness (generalized): Secondary | ICD-10-CM

## 2017-11-23 NOTE — Therapy (Signed)
[place], [place], [address] Phone: [phone]   Fax:  [phone]  Physical Therapy Treatment  Patient Details  Name: April Robinson MRN: [id number] Date of Birth: 09/10/1961 Referring Provider: Dr. Grant Fontana    Encounter Date: 11/23/2017  PT End of Session - 11/23/17 1630    Visit Number  7    Number of Visits  12    Date for PT Re-Evaluation  12/06/17    PT Start Time  4315    PT Stop Time  1629    PT Time Calculation (min)  38 min    Activity Tolerance  Patient tolerated treatment well    Behavior During Therapy  [facility] for tasks assessed/performed       Past Medical History:  Diagnosis Date  . Alcoholism ([place])   . Anxiety   . Arthritis   . Cancer ([place])    breast L  . Esophageal reflux   . Hiatal hernia   . Hx of radiation therapy 08/27/11 to 10/15/11   L breast  . Hyperlipemia   . Hypertension    past not on any medications currently  . Personal history of colonic polyps    TUBULAR ADENOMA    Past Surgical History:  Procedure Laterality Date  . ABDOMINAL HYSTERECTOMY     partial for DUB  . BREAST LUMPECTOMY  07/23/11   left breast  . COLONOSCOPY    . LAPAROSCOPIC HYSTERECTOMY  1995    There were no vitals filed for this visit.  Subjective Assessment - 11/23/17 1556    Subjective  Ultrasound helped a bit into the next day.  2/10. I would like to finish POC this week.  Stretches dont really last.      Currently in Pain?  Yes    Pain Score  2     Pain Location  Back    Pain Orientation  Left;Mid    Pain Descriptors / Indicators  Sore    Pain Type  Acute pain    Pain Onset  More than a month ago    Pain Frequency  Intermittent    Aggravating Factors   buckling carseat for granddaughter    Pain Relieving Factors  [place] helped         [facility] Adult PT Treatment/Exercise - 11/23/17 0001      Self-Care   Other Self-Care Comments   self massage tennis ball       Shoulder  Exercises: Standing   Other Standing Exercises  pressure to mid.low back with roller and overhead reaching, cross body reaching for self release     Other Standing Exercises  foam rolling transverse across mid back overhead stretching       Shoulder Exercises: ROM/Strengthening   UBE (Upper Arm Bike)  5 min L 1 for warm up, mobility       Modalities   Modalities  Iontophoresis;Ultrasound      Ultrasound   Ultrasound Location  L lower thoracic paraspinals     Ultrasound Parameters  100%, 1 MHz, 1 W/cm2`    Ultrasound Goals  Pain      Iontophoresis   Type of Iontophoresis  Dexamethasone    Location  L low thoracic    Dose  1 cc     Time  6 hour patch              PT Education - 11/23/17 2148    Education provided  Yes  Education Details  tennis ball, [place]    Person(s) Educated  Patient    Methods  Explanation    Comprehension  Verbalized understanding          PT Long Term Goals - 11/11/17 1224      PT LONG TERM GOAL #1   Title  Pt will be I with HEP for strength and posture    Time  6    Period  Weeks    Status  On-going      PT LONG TERM GOAL #2   Title  Pt will be able to sit as needed for work without needs for pain meds, not limited by spasms.     Time  6    Period  Weeks    Status  On-going      PT LONG TERM GOAL #3   Title  Pt will be able to lift and use UEs without increasing pain in back.    Time  6    Period  Weeks    Status  On-going      PT LONG TERM GOAL #4   Title  Pt will be able to demo good lifting and sitting posture to prevent reinjury.     Time  6    Period  Weeks    Status  On-going            Plan - 11/23/17 2148    Clinical Impression Statement  Addition of ultrasound seemed to relieve symptoms for about 36 hours.  She chose ot finish POC to see if she can further improve pain.  Ionto patch applied after ultrasound for further relief.  Pain not notieable as she walked out of clinic.     PT Treatment/Interventions   ADLs/Self Care Home Management;Cryotherapy;Therapeutic exercise;Therapeutic activities;Functional mobility training;Neuromuscular re-education;Patient/family education;Manual techniques;Taping;Dry needling;Iontophoresis 4mg /ml Dexamethasone;Moist Heat    PT Next Visit Plan  DC., FOTO booke openings, sidebnding over bolster, manual, postural training, standing UE/posture (upper back strength), check HEP    PT Home Exercise Plan  QL/trunk stretch and childs pose , supine scapular stabilization     Consulted and Agree with Plan of Care  Patient       Patient will benefit from skilled therapeutic intervention in order to improve the following deficits and impairments:  Decreased mobility, Obesity, Improper body mechanics, Postural dysfunction, Pain, Impaired UE functional use, Impaired flexibility, Increased fascial restricitons, Decreased strength, Decreased activity tolerance, Decreased range of motion  Visit Diagnosis: Pain in thoracic spine  Cramp and spasm  Muscle weakness (generalized)  Chronic left-sided low back pain, with sciatica presence unspecified     Problem List Patient Active Problem List   Diagnosis Date Noted  . Tendonitis, Achilles, right 08/05/2017  . Left knee pain 04/23/2015  . Knee pain, left 04/11/2015  . Popliteal pain 04/11/2015  . Hypertension 10/17/2014  . Acute pharyngitis 09/04/2014  . Left shoulder pain 02/19/2014  . Benign paroxysmal positional vertigo 09/29/2013  . Hot flashes due to tamoxifen 08/16/2013  . Hx of vaginitis 04/15/2012  . De Quervain's disease (tenosynovitis) 04/05/2012  . Breast cancer of upper-outer quadrant of left female breast ([place])   . IBS 12/03/2010  . ELEVATED BP READING WITHOUT DX HYPERTENSION 12/03/2010  . NECK SPRAIN AND STRAIN 12/03/2010  . ANXIETY STATE, UNSPECIFIED 11/07/2010  . ADD 11/07/2010  . GERD 11/07/2010  . CHEST PAIN UNSPECIFIED 11/07/2010    PAA,JENNIFER 11/23/2017, 9:59 PM  [facility]  [place], [place], [address] Phone: [phone]   Fax:  [phone]  Name: RINDA ROLLYSON MRN: [id number] Date of Birth: 1961-09-10  Raeford Razor, PT 11/23/17 9:59 PM Phone: [phone] Fax: [phone]

## 2017-11-24 ENCOUNTER — Ambulatory Visit: Payer: 59 | Admitting: Physical Therapy

## 2017-11-24 DIAGNOSIS — M545 Low back pain: Secondary | ICD-10-CM

## 2017-11-24 DIAGNOSIS — M6281 Muscle weakness (generalized): Secondary | ICD-10-CM

## 2017-11-24 DIAGNOSIS — M546 Pain in thoracic spine: Secondary | ICD-10-CM

## 2017-11-24 DIAGNOSIS — R252 Cramp and spasm: Secondary | ICD-10-CM

## 2017-11-24 DIAGNOSIS — G8929 Other chronic pain: Secondary | ICD-10-CM

## 2017-11-25 ENCOUNTER — Encounter: Payer: Self-pay | Admitting: Physical Therapy

## 2017-11-25 NOTE — Therapy (Addendum)
[place], [place], [address] Phone: [phone]   Fax:  [phone]  Physical Therapy Treatment  Patient Details  Name: April Robinson MRN: [id number] Date of Birth: [birth year] Referring Provider: Dr. Grant Fontana    Encounter Date: 11/24/2017  PT End of Session - 11/25/17 1420    Visit Number  8    Number of Visits  12    Date for PT Re-Evaluation  12/06/17    Authorization Type  United health care     PT Start Time  1630    PT Stop Time  1708    PT Time Calculation (min)  38 min    Activity Tolerance  Patient tolerated treatment well    Behavior During Therapy  [facility] for tasks assessed/performed       Past Medical History:  Diagnosis Date  . Alcoholism ([place])   . Anxiety   . Arthritis   . Cancer ([place])    breast L  . Esophageal reflux   . Hiatal hernia   . Hx of radiation therapy 08/27/11 to 10/15/11   L breast  . Hyperlipemia   . Hypertension    past not on any medications currently  . Personal history of colonic polyps    TUBULAR ADENOMA    Past Surgical History:  Procedure Laterality Date  . ABDOMINAL HYSTERECTOMY     partial for DUB  . BREAST LUMPECTOMY  07/23/11   left breast  . COLONOSCOPY    . LAPAROSCOPIC HYSTERECTOMY  1995    There were no vitals filed for this visit.  Subjective Assessment - 11/25/17 1400    Subjective  Patient reports ultrasound helped for a few days but overall it did not change. She was sore from the exercises. She was not able to dance on Friday. She has been to the MD and will likley have an MRI.     Limitations  Lifting;Sitting;Reading;Other (comment);Standing;Walking;House hold activities    How long can you sit comfortably?  8 hours per day, would like to get up every hour     How long can you stand comfortably?  right now she doesn't push it >15 min     How long can you walk comfortably?  has not done but could walk around the mall but had severe  pain    Diagnostic tests  XR of shoulder     Patient Stated Goals  Patient would like to be her normal busy self.     Currently in Pain?  Yes    Pain Score  6     Pain Location  Back    Pain Orientation  Left;Mid    Pain Descriptors / Indicators  Aching    Pain Type  Acute pain    Pain Onset  More than a month ago    Pain Frequency  Intermittent    Aggravating Factors   reaching; twistitng     Pain Relieving Factors  [place]     Effect of Pain on Daily Activities  unconmfortable working                       [facility] Adult PT Treatment/Exercise - 11/25/17 0001      Self-Care   Self-Care  Posture;Other Self-Care Comments    Other Self-Care Comments   reviewed progression of activity and the improtance of rest.      Ultrasound   Ultrasound Location  Left lower thoracic  Ultrasound Parameters  100% 1 mhz     Ultrasound Goals  Pain      Manual Therapy   Manual Therapy  Soft tissue mobilization    Manual therapy comments  L trunk, QL, paraspinals, ribcage/intercostals and periscapular    Joint Mobilization  rotational mobs to lower thoracic     Soft tissue mobilization  L thoracic paraspinals              PT Education - 11/25/17 1419    Education provided  Yes    Education Details  reviewed HEP     Person(s) Educated  Patient    Methods  Explanation;Demonstration;Tactile cues;Verbal cues    Comprehension  Returned demonstration;Verbalized understanding;Verbal cues required;Tactile cues required;Need further instruction          PT Long Term Goals - 11/11/17 1224      PT LONG TERM GOAL #1   Title  Pt will be I with HEP for strength and posture    Time  6    Period  Weeks    Status  On-going      PT LONG TERM GOAL #2   Title  Pt will be able to sit as needed for work without needs for pain meds, not limited by spasms.     Time  6    Period  Weeks    Status  On-going      PT LONG TERM GOAL #3   Title  Pt will be able to lift and use UEs without  increasing pain in back.    Time  6    Period  Weeks    Status  On-going      PT LONG TERM GOAL #4   Title  Pt will be able to demo good lifting and sitting posture to prevent reinjury.     Time  6    Period  Weeks    Status  On-going            Plan - 11/25/17 1420    Clinical Impression Statement  Patient reported tenderess to palpation still. She will see if the ultrasound has helped. This will likley be her last visit. Therapy will keep her case opening depdning on her reaction. She will also have imaging. If therapy dposent hear from her in 2-3 weeks D/ C to HEP.     Clinical Presentation  Stable    Clinical Decision Making  Low    Rehab Potential  Excellent    PT Frequency  2x / week    PT Duration  6 weeks    PT Treatment/Interventions  ADLs/Self Care Home Management;Cryotherapy;Therapeutic exercise;Therapeutic activities;Functional mobility training;Neuromuscular re-education;Patient/family education;Manual techniques;Taping;Dry needling;Iontophoresis '4mg'$ /ml Dexamethasone;Moist Heat    PT Next Visit Plan  DC., FOTO booke openings, sidebnding over bolster, manual, postural training, standing UE/posture (upper back strength), check HEP    PT Home Exercise Plan  QL/trunk stretch and childs pose , supine scapular stabilization        Patient will benefit from skilled therapeutic intervention in order to improve the following deficits and impairments:  Decreased mobility, Obesity, Improper body mechanics, Postural dysfunction, Pain, Impaired UE functional use, Impaired flexibility, Increased fascial restricitons, Decreased strength, Decreased activity tolerance, Decreased range of motion  Visit Diagnosis: Pain in thoracic spine  Cramp and spasm  Muscle weakness (generalized)  Chronic left-sided low back pain, with sciatica presence unspecified     Problem List Patient Active Problem List   Diagnosis Date Noted  . Tendonitis,  Achilles, right 08/05/2017  . Left knee  pain 04/23/2015  . Knee pain, left 04/11/2015  . Popliteal pain 04/11/2015  . Hypertension 10/17/2014  . Acute pharyngitis 09/04/2014  . Left shoulder pain 02/19/2014  . Benign paroxysmal positional vertigo 09/29/2013  . Hot flashes due to tamoxifen 08/16/2013  . Hx of vaginitis 04/15/2012  . De Quervain's disease (tenosynovitis) 04/05/2012  . Breast cancer of upper-outer quadrant of left female breast ([place])   . IBS 12/03/2010  . ELEVATED BP READING WITHOUT DX HYPERTENSION 12/03/2010  . NECK SPRAIN AND STRAIN 12/03/2010  . ANXIETY STATE, UNSPECIFIED 11/07/2010  . ADD 11/07/2010  . GERD 11/07/2010  . CHEST PAIN UNSPECIFIED 11/07/2010    Carney Living PT DPT  11/25/2017, 2:29 PM  [facility] [address] [place], [place], [address] Phone: [phone]   Fax:  [phone]  Name: April Robinson MRN: [id number] Date of Birth: 02-07-1961    PHYSICAL THERAPY DISCHARGE SUMMARY  Visits from Start of Care: 8  Current functional level related to goals / functional outcomes: Unknown, has not returned   Remaining deficits: None other than pain, see above    Education / Equipment: HEP, posture, RICE, [place]  Plan: Patient agrees to discharge.  Patient goals were partially met. Patient is being discharged due to not returning since the last visit.  ?????    Raeford Razor, PT 01/18/18 2:57 PM Phone: [phone] Fax: [phone]

## 2017-12-03 ENCOUNTER — Encounter: Payer: Self-pay | Admitting: Family Medicine

## 2017-12-03 ENCOUNTER — Other Ambulatory Visit: Payer: Self-pay

## 2017-12-03 ENCOUNTER — Ambulatory Visit: Payer: 59 | Admitting: Family Medicine

## 2017-12-03 VITALS — BP 133/89 | HR 94 | Temp 97.8°F | Resp 18 | Ht 62.0 in | Wt 185.0 lb

## 2017-12-03 DIAGNOSIS — M545 Low back pain, unspecified: Secondary | ICD-10-CM

## 2017-12-03 DIAGNOSIS — G8929 Other chronic pain: Secondary | ICD-10-CM

## 2017-12-03 DIAGNOSIS — I10 Essential (primary) hypertension: Secondary | ICD-10-CM

## 2017-12-03 DIAGNOSIS — M549 Dorsalgia, unspecified: Secondary | ICD-10-CM | POA: Diagnosis not present

## 2017-12-03 NOTE — Patient Instructions (Signed)
     IF you received an x-ray today, you will receive an invoice from [facility]. Please contact [facility] at [phone] with questions or concerns regarding your invoice.   IF you received labwork today, you will receive an invoice from [facility]. Please contact [facility] at [phone] with questions or concerns regarding your invoice.   Our billing staff will not be able to assist you with questions regarding bills from these companies.  You will be contacted with the lab results as soon as they are available. The fastest way to get your results is to activate your My Chart account. Instructions are located on the last page of this paperwork. If you have not heard from us regarding the results in 2 weeks, please contact this office.     
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
## 2017-12-16 ENCOUNTER — Ambulatory Visit: Payer: 59 | Admitting: Family Medicine

## 2017-12-29 ENCOUNTER — Telehealth: Payer: Self-pay | Admitting: Oncology

## 2017-12-29 ENCOUNTER — Other Ambulatory Visit: Payer: Self-pay | Admitting: Oncology

## 2017-12-29 DIAGNOSIS — Z853 Personal history of malignant neoplasm of breast: Secondary | ICD-10-CM

## 2017-12-29 NOTE — Telephone Encounter (Signed)
Patient called to schedule office visit. Patient scheduled 4/16.

## 2018-01-07 ENCOUNTER — Telehealth: Payer: Self-pay

## 2018-01-07 NOTE — Telephone Encounter (Signed)
Phone call to patient to discuss. She states she is concerned regarding bruising on bilateral legs. She states, "I suspect it may a side effect from medication. We have some a clinic here on site that has a Marine scientist. I had her take a look yesterday and she thinks it may be a side effect of some of the medications I take."   Patient's husband told patient to take an aspirin once a day last friday. States that they have been discussing trying to take better care of themselves, heard a daily aspirin was recommended for heart health. She states, "I bought some aspirin and I started taking it daily. Wednesday night I noticed my legs were a little sore. I dance for exercise. I happened to look down after taking my clothes off and I saw bruises on my legs. I hadn't bumped or run in to anything. 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 Bruises are on inner thigh above knee. One is on each leg on outside of thigh as well. Tender to touch yesterday. Last aspirin dose was two days. Has been feeling better since she stopped aspirin. Patient thought she had a migraine Sunday - Tuesday. Blood pressure today at work was 124/84. Denies shortness of breath, chest pain. States she did have some swelling, denies symptoms today.  Patient states she is concerned about this, "I had a cousin who passed away while she was driving due to a blood clot. She was only 58 years old".   Patient advised to return call to clinic with any new or worsening symptoms and ask to speak with triage nurse.  Provider, please advise regarding aspirin.

## 2018-01-07 NOTE — Telephone Encounter (Signed)
Copied from [place] [phone]. Topic: General - Other >> Jan 06, 2018  8:56 AM Yvette Rack wrote: Reason for CRM: patient calling stating that she noticed Last night that she had purple bruising on her legs and now they are black she doesn't remember bumping anything she also states that she has no SOB or nausea please call pt on her cell phone she didn't want to make an appt for today unless she hear from someone

## 2018-01-10 NOTE — Telephone Encounter (Signed)
I agree she should hold off on aspirin. Wondering if it was full strength, 325mg , which increase risk of bleeding and bruising. Bruises should be getting better and as long as she does not have any new bruises things should be ok. Any further concerns should be evaluated in clinic. Thanks

## 2018-01-11 NOTE — Telephone Encounter (Signed)
Phone call to patient. Relayed message from Dr. Pamella Pert. She verbalizes understanding. She states her bruising has turned blue to black, also has had some abdominal pain last night, none currently. 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 Recommended she come in for office visit due to persisting concerns. She states she did not want to do this, but is agreeable. Transferred to front desk to schedule- seeing Dr. Pamella Pert on 01/13/2018.

## 2018-01-13 ENCOUNTER — Encounter: Payer: Self-pay | Admitting: Family Medicine

## 2018-01-13 ENCOUNTER — Ambulatory Visit: Payer: 59 | Admitting: Family Medicine

## 2018-01-13 ENCOUNTER — Other Ambulatory Visit: Payer: Self-pay

## 2018-01-13 VITALS — BP 132/82 | HR 100 | Temp 97.8°F | Ht 62.0 in | Wt 183.0 lb

## 2018-01-13 DIAGNOSIS — R233 Spontaneous ecchymoses: Secondary | ICD-10-CM

## 2018-01-13 DIAGNOSIS — R238 Other skin changes: Secondary | ICD-10-CM | POA: Diagnosis not present

## 2018-01-13 NOTE — Patient Instructions (Signed)
     IF you received an x-ray today, you will receive an invoice from [facility]. Please contact [facility] at [phone] with questions or concerns regarding your invoice.   IF you received labwork today, you will receive an invoice from [facility]. Please contact [facility] at [phone] with questions or concerns regarding your invoice.   Our billing staff will not be able to assist you with questions regarding bills from these companies.  You will be contacted with the lab results as soon as they are available. The fastest way to get your results is to activate your My Chart account. Instructions are located on the last page of this paperwork. If you have not heard from us regarding the results in 2 weeks, please contact this office.     

## 2018-01-13 NOTE — Progress Notes (Signed)
3/21/20195:42 PM  April Robinson Mood [birth year], 57 y.o. female [id number]  Chief Complaint  Patient presents with  . Bleeding/Bruising    has unexplained bruising on both legs that are very tender for 1 week    HPI:   Patient is a 58 y.o. female who presents today for bruising of medial distal thigh just above the knees that were first apparent a week ago, still purplish, slightly tender. No other bruises noted. No injury, trauma. No bleeding gums, bloody nose, blood in urine or stool. She reports that days prior to appearance of bruising she had been taking aspirin daily for headaches. Normally does not take aspirin. She denies any concurrent use of nsaids. Denies any abnormal bruising before.   Depression screen [facility] 2/9 01/13/2018 12/03/2017 11/18/2017  Decreased Interest 0 0 0  Down, Depressed, Hopeless 0 0 0  PHQ - 2 Score 0 0 0    Allergies  Allergen Reactions  . Meloxicam Shortness Of Breath and Nausea Only  . Miconazole Nitrate Other (See Comments)    SWELLING OF SOFT PALATE THAT CAUSED DIFFICULTY SWALLOWING  . Latex Itching, Rash and Other (See Comments)    Fine, little bumps    Prior to Admission medications   Medication Sig Start Date End Date Taking? Authorizing Provider  albuterol (PROVENTIL HFA;VENTOLIN HFA) 108 (90 Base) MCG/ACT inhaler Inhale 2 puffs into the lungs every 6 (six) hours as needed for wheezing or shortness of breath. 10/12/17  Yes Rutherford Guys, MD  amLODipine (NORVASC) 2.5 MG tablet Take 1 tablet (2.5 mg total) by mouth daily. 11/18/17  Yes Rutherford Guys, MD  cyclobenzaprine (FLEXERIL) 10 MG tablet Take 1 tablet (10 mg total) by mouth at bedtime. 11/18/17  Yes Rutherford Guys, MD  DEXILANT 60 MG capsule TAKE 1 CAPSULE BY MOUTH EVERY DAY 11/16/17  Yes Roma Schanz R, DO  ibuprofen (ADVIL,MOTRIN) 800 MG tablet Take 1 tablet (800 mg total) by mouth 3 (three) times daily. 09/25/17  Yes Ward, Ozella Almond, PA-C  tamoxifen (NOLVADEX) 20 MG tablet  TAKE 1 TABLET (20 MG TOTAL) BY MOUTH DAILY. 09/08/17  Yes Magrinat, Virgie Dad, MD  valACYclovir (VALTREX) 500 MG tablet TAKE ONE TAB TWICE DAILY FOR 3-5 DAYS WITH OUTBREAK. 10/12/17  Yes Rutherford Guys, MD    Past Medical History:  Diagnosis Date  . Alcoholism ([place])   . Anxiety   . Arthritis   . Cancer ([place])    breast L  . Esophageal reflux   . Hiatal hernia   . Hx of radiation therapy 08/27/11 to 10/15/11   L breast  . Hyperlipemia   . Hypertension    past not on any medications currently  . Personal history of colonic polyps    TUBULAR ADENOMA    Past Surgical History:  Procedure Laterality Date  . ABDOMINAL HYSTERECTOMY     partial for DUB  . BREAST LUMPECTOMY  07/23/11   left breast  . COLONOSCOPY    . LAPAROSCOPIC HYSTERECTOMY  1995    Social History   Tobacco Use  . Smoking status: Former Smoker    Types: Cigarettes    Last attempt to quit: 1983    Years since quitting: 36.2  . Smokeless tobacco: Never Used  Substance Use Topics  . Alcohol use: Yes    Alcohol/week: 0.0 oz    Comment: rare    Family History  Problem Relation Age of Onset  . Breast cancer Mother   . Cancer Mother  breast  . Hyperlipidemia Mother   . Diabetes Father   . Hypertension Father   . Cancer Father   . Hyperlipidemia Father   . Cancer Brother        NEUROENDOCRINE  . Hypertension Brother   . Arthritis Brother        hips  . Colon cancer Neg Hx     ROS Per hpi  OBJECTIVE:  Blood pressure 132/82, pulse 100, temperature 97.8 F (36.6 C), temperature source Oral, height 5\' 2"  (1.575 m), weight 183 lb (83 kg), SpO2 96 %.  Physical Exam  Constitutional: She is oriented to person, place, and time and well-developed, well-nourished, and in no distress.  HENT:  Head: Normocephalic and atraumatic.  Mouth/Throat: Mucous membranes are normal.  Eyes: Pupils are equal, round, and reactive to light. EOM are normal. No scleral icterus.  Neck: Neck supple.    Pulmonary/Chest: Effort normal.  Neurological: She is alert and oriented to person, place, and time. Gait normal.  Skin: Skin is warm and dry. Bruising noted. No petechiae and no purpura noted.  Psychiatric: Mood and affect normal.  Nursing note and vitals reviewed.    ASSESSMENT and PLAN  1. Abnormal bruising Bruising at site of pressure points. Most likely related to recent ASA use. Checking basic coag. Continue to monitor clinically.  - CBC - PT AND PTT  Return if symptoms worsen or fail to improve.    Rutherford Guys, MD Primary Care at [place], [place] [address] Ph.  [phone] Fax [phone]

## 2018-01-14 LAB — CBC
Hematocrit: 43.5 % (ref 34.0–46.6)
Hemoglobin: 14.6 g/dL (ref 11.1–15.9)
MCH: 28.9 pg (ref 26.6–33.0)
MCHC: 33.6 g/dL (ref 31.5–35.7)
MCV: 86 fL (ref 79–97)
Platelets: 263 10*3/uL (ref 150–379)
RBC: 5.06 x10E6/uL (ref 3.77–5.28)
RDW: 14.9 % (ref 12.3–15.4)
WBC: 5.8 10*3/uL (ref 3.4–10.8)

## 2018-01-14 LAB — PT AND PTT
INR: 0.9 (ref 0.8–1.2)
Prothrombin Time: 9.8 s (ref 9.1–12.0)
aPTT: 30 s (ref 24–33)

## 2018-01-17 ENCOUNTER — Encounter: Payer: Self-pay | Admitting: *Deleted

## 2018-01-22 ENCOUNTER — Other Ambulatory Visit: Payer: Self-pay | Admitting: Family Medicine

## 2018-01-31 ENCOUNTER — Ambulatory Visit
Admission: RE | Admit: 2018-01-31 | Discharge: 2018-01-31 | Disposition: A | Discharge status: Home / Self Care | Payer: 59 | Source: Ambulatory Visit | Attending: Oncology | Admitting: Oncology

## 2018-01-31 DIAGNOSIS — Z853 Personal history of malignant neoplasm of breast: Secondary | ICD-10-CM

## 2018-01-31 DIAGNOSIS — R928 Other abnormal and inconclusive findings on diagnostic imaging of breast: Secondary | ICD-10-CM | POA: Diagnosis not present

## 2018-01-31 HISTORY — DX: Personal history of irradiation: Z92.3

## 2018-02-07 NOTE — Progress Notes (Signed)
ID: April Robinson   DOB: 1961-03-01  MR#: [id number]  LOV#:[id number]  PCP: Rutherford Guys, MD GYN: Uvaldo Rising, MD SU: Stark Klein, MD OTHER MD: Kyung Rudd, MD;  Verl Blalock, MD  CHIEF COMPLAINT:  Hx of Left Breast Cancer  CURRENT TREATMENT: tamoxifen   BREAST CANCER HISTORY: From the original intake note:  The patient had a questionable palpable finding in the left breast which was evaluated in July 2009 and it was recommended that she return for further evaluation but actually did not show.  She had bilateral diagnostic mammography in March 2011 which showed minimal duct ectasia of the left breast at the location in question.  Again, 6-monthinterval mammography was recommended and this was repeated August 2012.  There was minimally a more prominent ductal ectasia and MRI was recommended for further evaluation.  This was performed on June 22, 2011, and it showed a 1.2 cm enhancing mass in the posterior aspect of an area of irregular linear enhancement.  Anteriorly to this, there was a 6 mm area which is the area of dilated duct noted by prior evaluation.   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 The tumor was estrogen receptor positive at 72%, progesterone receptor positive at 5%, with a very low MIB1 at 4%.  HER2 was not amplified with a ratio by C[facility] of 1.10.   The patient's subsequent history is as detailed below.  INTERVAL HISTORY:  PTerriannereturns today for follow-up of her estrogen receptor positive breast cancer. She continues on tamoxifen, with good tolerance. She still has hot flashes, but this has improved over time. She denies an increase in vaginal discharge. Instead, she is experiencing significant problems with vaginal dryness.   Since her last visit, she underwent diagnostic  bilateral [facility] with [facility] [facility] breast density category B. There was no evidence of malignancy.    REVIEW OF SYSTEMS:  April Robinson that she is doing okay. She is completed her degree in IT, and she now works at the service desk for an [employer] She is going through some family troubles because her father has Alzheimer's and cancer. April Robinson that the family is overwhelmed because her father is her mother's primary care giver. They are considering placing them into a care facility. For exercise, she dances at least one per week along with taking walks. She denies unusual headaches, visual changes, nausea, vomiting, or dizziness. There has been no unusual cough, phlegm production, or pleurisy. This been no change in bowel or bladder habits. She denies unexplained fatigue or unexplained weight loss, bleeding, rash, or fever. A detailed review of systems was otherwise stable.    PAST MEDICAL HISTORY: Past Medical History:  Diagnosis Date  . [place]ism ([place]   . Anxiety   . Arthritis   . [place]    breast L  . Esophageal reflux   . Hiatal hernia   . Hx of radiation therapy 08/27/11 to 10/15/11   L breast  . Hyperlipemia   . Hypertension    past not on any medications currently  . Personal history of colonic polyps    TUBULAR ADENOMA  . Personal history of radiation therapy     PAST SURGICAL HISTORY: Past Surgical History:  Procedure Laterality Date  . ABDOMINAL HYSTERECTOMY  partial for DUB  . BREAST LUMPECTOMY  07/23/11   left breast  . COLONOSCOPY    . LAPAROSCOPIC HYSTERECTOMY  1995  no BSO, still has both ovaries  FAMILY HISTORY Family History  Problem Relation Age of Onset  . Breast cancer Mother   . Cancer Mother        breast  . Hyperlipidemia Mother   . Diabetes Father   . Hypertension Father   . Cancer Father   . Hyperlipidemia Father   . Cancer Brother        NEUROENDOCRINE  . Hypertension  Brother   . Arthritis Brother        hips  . Colon cancer Neg Hx   The patient's father is alive at age 65. The patient's father has breast cancer. The patient's mother is alive at age 70.  She was diagnosed with breast cancer at age 67.  The patient had 2 brothers; one of them died from a neuroendocrine tumor.  She has 1 sister.  There is no other breast or ovarian cancer in the immediate family.  GYNECOLOGIC HISTORY:  (Reviewed 02/19/2014) She had menarche, age 30. She is GX P3 with first live birth at age 29. She had a hysterectomy in 1993, but did not have her ovaries removed.  She took hormone replacement for at least 2 years.    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  Her husband, April Robinson, works as a Administrator.  The patient's children from a prior marriage are:  April Robinson, 34, who lives in [place] and works as a Production assistant, radio.  April Robinson who works as a Pharmacist, hospital of ages between 76 and 11; she is also a Presenter, broadcasting.  April Robinson, 29, who studies accounting at [employer]. The patient has 8 grandchildren. She is very active in her local church    ADVANCED DIRECTIVES: Not in place  HEALTH MAINTENANCE:  (Updated 04/27/215) Social History   Tobacco Use  . Smoking status: Former Smoker    Types: Cigarettes    Last attempt to quit: 1983    Years since quitting: 36.3  . Smokeless tobacco: Never Used  Substance Use Topics  . Alcohol use: Yes    Alcohol/week: 0.0 oz    Comment: rare  . Drug use: No     Colonoscopy: 10/02/2016/ normal/ Dr. Raquel James  PAP:  UTD, Dr. Toney Rakes  Bone density:  09/18/2013, normal  Lipid panel:  UTD, Dr. Etter Sjogren   Allergies  Allergen Reactions  . Meloxicam Shortness Of Breath and Nausea Only  . Miconazole Nitrate Other (See Comments)    SWELLING OF SOFT PALATE THAT CAUSED DIFFICULTY SWALLOWING  . Latex  Itching, Rash and Other (See Comments)    Fine, little bumps    Current Outpatient Medications  Medication Sig Dispense Refill  . albuterol (PROVENTIL HFA;VENTOLIN HFA) 108 (90 Base) MCG/ACT inhaler Inhale 2 puffs into the lungs every 6 (six) hours as needed for wheezing or shortness of breath. 1 Inhaler 0  . amLODipine (NORVASC) 2.5 MG tablet TAKE 1 TABLET BY MOUTH EVERY DAY 30 tablet 1  . cyclobenzaprine (FLEXERIL) 10 MG tablet Take 1 tablet (10 mg total) by mouth at bedtime. 30 tablet 0  . DEXILANT 60 MG capsule TAKE 1 CAPSULE BY MOUTH EVERY DAY 30 capsule 3  . ibuprofen (ADVIL,MOTRIN) 800 MG tablet Take 1 tablet (800 mg  total) by mouth 3 (three) times daily. 21 tablet 0  . tamoxifen (NOLVADEX) 20 MG tablet Take 1 tablet (20 mg total) by mouth daily. 90 tablet 1  . valACYclovir (VALTREX) 500 MG tablet TAKE ONE TAB TWICE DAILY FOR 3-5 DAYS WITH OUTBREAK. 10 tablet 2   Current Facility-Administered Medications  Medication Dose Route Frequency Provider Last Rate Last Dose  . 0.9 %  sodium chloride infusion  500 mL Intravenous Continuous Pyrtle, Lajuan Lines, MD        OBJECTIVE: Middle-aged [place] American woman who appears well  Vitals:   02/08/18 0958  BP: 131/70  Pulse: 78  Resp: 18  Temp: 98.2 F (36.8 C)  SpO2: 100%     Body mass index is 33.38 kg/m.    ECOG FS: 0 Filed Weights   02/08/18 0958  Weight: 182 lb 8 oz (82.8 kg)   Sclerae unicteric, pupils round and equal Oropharynx clear and moist No cervical or supraclavicular adenopathy Lungs no rales or rhonchi Heart regular rate and rhythm Abd soft, nontender, positive bowel sounds MSK no focal spinal tenderness, no upper extremity lymphedema Neuro: nonfocal, well oriented, appropriate affect Breasts: The right breast is benign.  The left breast is status post lumpectomy and radiation.  There is no evidence of local recurrence.  Both axillae are benign.  LAB RESULTS: Lab Results  Component Value Date   WBC 5.8  01/13/2018   NEUTROABS 3.2 09/03/2016   HGB 14.6 01/13/2018   HCT 43.5 01/13/2018   MCV 86 01/13/2018   PLT 263 01/13/2018      Chemistry      Component Value Date/Time   NA 141 02/27/2017 0224   NA 143 09/03/2016 0830   K 4.0 02/27/2017 0224   K 4.0 09/03/2016 0830   CL 105 02/27/2017 0224   CL 107 01/30/2013 1509   CO2 27 02/27/2017 0224   CO2 26 09/03/2016 0830   BUN 13 02/27/2017 0224   BUN 14.6 09/03/2016 0830   CREATININE 1.01 (H) 02/27/2017 0224   CREATININE 1.1 09/03/2016 0830      Component Value Date/Time   CALCIUM 9.0 02/27/2017 0224   CALCIUM 9.3 09/03/2016 0830   ALKPHOS 53 02/27/2017 0224   ALKPHOS 67 09/03/2016 0830   AST 25 02/27/2017 0224   AST 21 09/03/2016 0830   ALT 22 02/27/2017 0224   ALT 22 09/03/2016 0830   BILITOT 0.6 02/27/2017 0224   BILITOT 0.51 09/03/2016 0830        STUDIES: Mm Diag Breast Tomo Bilateral  Result Date: 01/31/2018 CLINICAL DATA:  LEFT lumpectomy with radiation therapy in 2012. EXAM: DIGITAL DIAGNOSTIC BILATERAL MAMMOGRAM WITH CAD AND TOMO COMPARISON:  01/29/2017 and earlier ACR Breast Density Category b: There are scattered areas of fibroglandular density. FINDINGS: Post operative changes are seen in the LEFTbreast. No suspicious mass, distortion, or microcalcifications are identified to suggest presence of malignancy. Mammographic images were processed with CAD. IMPRESSION: No mammographic evidence for malignancy. RECOMMENDATION: Screening mammogram in one year.(Code:SM-B-01Y) I have discussed the findings and recommendations with the patient. Results were also provided in writing at the conclusion of the visit. If applicable, a reminder letter will be sent to the patient regarding the next appointment. BI-RADS CATEGORY  2: Benign. Electronically Signed   By: Nolon Nations M.D.   On: 01/31/2018 15:57   ASSESSMENT: 57 y.o.  [place] woman   (1)  status post left lumpectomy and sentinel lymph node biopsy September of 2012  for a T1c N0,  Stage IA  invasive ductal carcinoma, grade 2, estrogen and progesterone receptor positive with a very low MIB-1 and no HER-2 amplification.   (2)  She completed adjuvant radiation 10/15/2011   (3) started tamoxifen 10/27/2011, the plan being to continue for total of 10 years until 2023.  (4) s/p remote TAH, with ovaries still intact    PLAN:  Cecile is now 6-1/2 years out from definitive surgery for her breast cancer with no evidence of disease recurrence.  She is tolerating tamoxifen well and the plan continues to be to continue the drug for a total of 10 years.  Although tamoxifen can cause some vaginal wetness and does not "take away her estrogen", she is having significant problems with vaginal dryness.  We are going to refer her to our "pelvic health" program.  I think she will benefit from that  If necessary undercover of tamoxifen it is okay for her to use vaginal estrogen creams.  Her mother's Alzheimer's is becoming more of a family issue.  I suggested she read "the 36-hour day".  Otherwise Laramie will return to see me in 1 year.  She knows to call for any problems that may develop before then.  Shaneka Efaw, Virgie Dad, MD  02/08/18 10:30 AM Medical Oncology and Hematology [facility] [address] [place], [place] [address] Tel. [phone]    Fax. [phone]    This document serves as a record of services personally performed by Lurline Del, MD. It was created on his behalf by Sheron Nightingale, a trained medical scribe. The creation of this record is based on the scribe's personal observations and the provider's statements to them.    I have reviewed the above documentation for accuracy and completeness, and I agree with the above.

## 2018-02-08 ENCOUNTER — Inpatient Hospital Stay: Payer: 59 | Attending: Oncology | Admitting: Oncology

## 2018-02-08 ENCOUNTER — Telehealth: Payer: Self-pay | Admitting: Oncology

## 2018-02-08 VITALS — BP 131/70 | HR 78 | Temp 98.2°F | Resp 18 | Ht 62.0 in | Wt 182.5 lb

## 2018-02-08 DIAGNOSIS — Z90710 Acquired absence of both cervix and uterus: Secondary | ICD-10-CM | POA: Diagnosis not present

## 2018-02-08 DIAGNOSIS — C50412 Malignant neoplasm of upper-outer quadrant of left female breast: Secondary | ICD-10-CM | POA: Diagnosis not present

## 2018-02-08 DIAGNOSIS — Z170 Estrogen receptor positive status [ER+]: Secondary | ICD-10-CM | POA: Diagnosis not present

## 2018-02-08 DIAGNOSIS — Z923 Personal history of irradiation: Secondary | ICD-10-CM | POA: Insufficient documentation

## 2018-02-08 DIAGNOSIS — Z79810 Long term (current) use of selective estrogen receptor modulators (SERMs): Secondary | ICD-10-CM

## 2018-02-08 MED ORDER — TAMOXIFEN CITRATE 20 MG PO TABS: 20.0000 mg | ORAL_TABLET | Freq: Every day | ORAL | 1 refills | Status: DC

## 2018-02-08 NOTE — Telephone Encounter (Signed)
Gave patient AVs and calendar of upcoming April 2020 appointments.  °

## 2018-02-15 ENCOUNTER — Ambulatory Visit: Payer: 59 | Admitting: Physical Therapy

## 2018-03-04 ENCOUNTER — Ambulatory Visit: Payer: 59 | Admitting: Family Medicine

## 2018-04-12 ENCOUNTER — Other Ambulatory Visit: Payer: Self-pay | Admitting: Family Medicine

## 2018-04-13 NOTE — Telephone Encounter (Signed)
On office visit 12-03-2017 Dr Pamella Pert note stated needed to followup for office visit in 3  Months  For Htn. Will refill for  1 month- message left on VM to call back to schedule an appointment  Per protocall

## 2018-05-06 ENCOUNTER — Other Ambulatory Visit: Payer: Self-pay

## 2018-05-06 ENCOUNTER — Ambulatory Visit: Payer: 59 | Admitting: Physician Assistant

## 2018-05-06 ENCOUNTER — Encounter: Payer: Self-pay | Admitting: Physician Assistant

## 2018-05-06 ENCOUNTER — Ambulatory Visit (INDEPENDENT_AMBULATORY_CARE_PROVIDER_SITE_OTHER): Payer: 59

## 2018-05-06 VITALS — BP 125/86 | HR 93 | Temp 99.0°F | Resp 20 | Ht 63.39 in | Wt 178.0 lb

## 2018-05-06 DIAGNOSIS — M1712 Unilateral primary osteoarthritis, left knee: Secondary | ICD-10-CM | POA: Diagnosis not present

## 2018-05-06 DIAGNOSIS — M25562 Pain in left knee: Secondary | ICD-10-CM | POA: Diagnosis not present

## 2018-05-06 DIAGNOSIS — G8929 Other chronic pain: Secondary | ICD-10-CM

## 2018-05-06 DIAGNOSIS — M7052 Other bursitis of knee, left knee: Secondary | ICD-10-CM | POA: Diagnosis not present

## 2018-05-06 DIAGNOSIS — M7050 Other bursitis of knee, unspecified knee: Secondary | ICD-10-CM

## 2018-05-06 NOTE — Progress Notes (Signed)
April Robinson  MRN: [id number] DOB: January 10, 1961  Subjective:   April Robinson is a 57 y.o. female who presents with intermittent knee pain involving the left knee. Onset was gradual, starting about 3 months ago.Occurs about 2 x per week. Inciting event: Pt is a Tourist information centre manager. Dances every day. No acute injury she can recall but a few months ago did have a specific dance that required hopping a lot on left leg. It has not been the same since. Been dancing for years. Current symptoms include: crepitus sensation, pain located front of knee, popping sensation, stiffness and swelling. Pain is aggravated after any long dance classes. Patient has had no prior knee problems. Evaluation to date: plain films: showed OA. Treatment to date: OTC analgesics for a few days at a time which are effective. Has not tried stretching, ice, or brace.    Review of Systems  Constitutional: Negative for chills, diaphoresis, fever and unexpected weight change.  Neurological: Negative for numbness.    Patient Active Problem List   Diagnosis Date Noted  . Tendonitis, Achilles, right 08/05/2017  . Left knee pain 04/23/2015  . Knee pain, left 04/11/2015  . Popliteal pain 04/11/2015  . Hypertension 10/17/2014  . Acute pharyngitis 09/04/2014  . Left shoulder pain 02/19/2014  . Benign paroxysmal positional vertigo 09/29/2013  . Hot flashes due to tamoxifen 08/16/2013  . Hx of vaginitis 04/15/2012  . De Quervain's disease (tenosynovitis) 04/05/2012  . Malignant neoplasm of upper-outer quadrant of left breast in female, estrogen receptor positive ([place])   . IBS 12/03/2010  . ELEVATED BP READING WITHOUT DX HYPERTENSION 12/03/2010  . NECK SPRAIN AND STRAIN 12/03/2010  . ANXIETY STATE, UNSPECIFIED 11/07/2010  . ADD 11/07/2010  . GERD 11/07/2010  . CHEST PAIN UNSPECIFIED 11/07/2010    Current Outpatient Medications on File Prior to Visit  Medication Sig Dispense Refill  . albuterol (PROVENTIL HFA;VENTOLIN HFA) 108 (90  Base) MCG/ACT inhaler Inhale 2 puffs into the lungs every 6 (six) hours as needed for wheezing or shortness of breath. 1 Inhaler 0  . amLODipine (NORVASC) 2.5 MG tablet TAKE 1 TABLET BY MOUTH EVERY DAY 30 tablet 0  . cyclobenzaprine (FLEXERIL) 10 MG tablet Take 1 tablet (10 mg total) by mouth at bedtime. 30 tablet 0  . DEXILANT 60 MG capsule TAKE 1 CAPSULE BY MOUTH EVERY DAY 30 capsule 3  . ibuprofen (ADVIL,MOTRIN) 800 MG tablet Take 1 tablet (800 mg total) by mouth 3 (three) times daily. 21 tablet 0  . tamoxifen (NOLVADEX) 20 MG tablet Take 1 tablet (20 mg total) by mouth daily. 90 tablet 1  . valACYclovir (VALTREX) 500 MG tablet TAKE ONE TAB TWICE DAILY FOR 3-5 DAYS WITH OUTBREAK. 10 tablet 2   Current Facility-Administered Medications on File Prior to Visit  Medication Dose Route Frequency Provider Last Rate Last Dose  . 0.9 %  sodium chloride infusion  500 mL Intravenous Continuous Pyrtle, Lajuan Lines, MD        Allergies  Allergen Reactions  . Meloxicam Shortness Of Breath and Nausea Only  . Miconazole Nitrate Other (See Comments)    SWELLING OF SOFT PALATE THAT CAUSED DIFFICULTY SWALLOWING  . Latex Itching, Rash and Other (See Comments)    Fine, little bumps     Objective:  BP 125/86 (BP Location: Right Arm, Patient Position: Sitting, Cuff Size: Normal)   Pulse 93   Temp 99 F (37.2 C) (Oral)   Resp 20   Ht 5' 3.39" (1.61 m)  Wt 178 lb (80.7 kg)   SpO2 98%   BMI 31.15 kg/m   Physical Exam  Constitutional: She is oriented to person, place, and time. She appears well-developed and well-nourished. No distress.  HENT:  Head: Normocephalic and atraumatic.  Eyes: Conjunctivae are normal.  Neck: Normal range of motion.  Pulmonary/Chest: Effort normal.  Musculoskeletal:       Left knee: She exhibits swelling (of suprapatellar bursa and pes anserine bursa) and deformity (crepitus noted). She exhibits normal range of motion, no ecchymosis, no erythema, normal patellar mobility,  normal meniscus and no MCL laxity. Tenderness (mild with palpation of pes anserine ) found. No medial joint line, no lateral joint line, no MCL, no LCL and no patellar tendon tenderness noted.  Left knee exam: Negative anterior/posterior drawer, McMurray, and Valgus/Varus stress test.  No overlying erythema or warmth.   Neurological: She is alert and oriented to person, place, and time.  Skin: Skin is warm and dry.  Psychiatric: She has a normal mood and affect.  Vitals reviewed.  Dg Knee Complete 4 Views Left  Result Date: 05/06/2018 CLINICAL DATA:  History of arthritis, knee pain and swelling for 3 months. EXAM: LEFT KNEE - COMPLETE 4+ VIEW COMPARISON:  None. FINDINGS: Osseous alignment is normal. Bone mineralization is normal. No fracture line or displaced fracture fragment. No acute or suspicious osseous lesion. Again noted is mild narrowing of the medial compartment, stable. No osteophytes or other evidence of advanced degenerative change. Soft tissues are unremarkable. Chronic spurring again noted at the proximal patellar tendon insertion site. IMPRESSION: 1. Stable mild narrowing of the medial compartment suggesting some degree of underlying cartilage loss and/or meniscal derangement. No osteophytes or other signs of advanced degenerative joint disease. 2. No acute findings. Electronically Signed   By: Franki Cabot M.D.   On: 05/06/2018 14:47     Assessment and Plan :  1. Pes anserine bursitis 2. Suprapatellar bursitis of left knee Plain films with no acute findings.  Does have mild narrowing of the medial compartment suggesting some degree of cartilage loss from meniscal derangement.  Physical exam findings most consistent with peds anserine and suprapatellar bursitis.  Patient is very physically active with dancing.  Strongly encouraged her to take at least 5 to 7-day break from exercise. Recommend conservative treatment with R.I.C.E. principles.  Encouraged using brace consistently,  taking ibuprofen 800 mg every 8 hours, and icing at least 4-5 times a day for 20 min at a time for the next week.  Given educational material for sports rehab exercises to start doing for pes anserine bursitis. Advised to return to clinic if symptoms worsen, do not improve, or as needed.  - Knee brace  3. Chronic pain of left knee - DG Knee Complete 4 Views Left; Future   Tenna Delaine PA-C  [facility] 05/06/2018 2:50 PM

## 2018-05-06 NOTE — Patient Instructions (Addendum)
I recommend resting completely for the next 5-7 days. Wear brace. Apply ice to affected area 4-5 x a day for 20 minutes at a time. Take ibuprofen 800 mg every 8 hours for the next 5-7 days. Start sports rehab exercises as tolerated. After this week, try to advance activity slowly. After practice, ice the knee and take antiinflammatories as needed. Wear brace as needed. If any of your symptoms persist or worsen or you develop new redness, warmth, or fever, return to office for further evaluation.    IF you received an x-ray today, you will receive an invoice from [facility]. Please contact [facility] at [phone] with questions or concerns regarding your invoice.   IF you received labwork today, you will receive an invoice from [facility]. Please contact [facility] at [phone] with questions or concerns regarding your invoice.   Our billing staff will not be able to assist you with questions regarding bills from these companies.  You will be contacted with the lab results as soon as they are available. The fastest way to get your results is to activate your My Chart account. Instructions are located on the last page of this paperwork. If you have not heard from [place] regarding the results in 2 weeks, please contact this office.     Pes Anserine Bursitis Rehab Ask your health care provider which exercises are safe for you. Do exercises exactly as told by your health care provider and adjust them as directed. It is normal to feel mild stretching, pulling, tightness, or discomfort as you do these exercises, but you should stop right away if you feel sudden pain or your pain gets worse.Do not begin these exercises until told by your health care provider. Stretching and range of motion exercises These exercises warm up your muscles and joints and improve the movement and flexibility of your knee. These exercises also help to relieve pain and stiffness. Exercise A: Hamstring,  doorway  1. Lie on your back in front of a doorway with your left / right leg resting against the wall and your other leg flat on the floor in the doorway. There should be a slight bend in your left / right knee. 2. Straighten your left / right knee. You should feel a stretch behind your knee or thigh. If you do not, scoot your buttocks closer to the door. 3. Hold this position for __________ seconds. Repeat __________ times. Complete this stretch __________ times a day. Exercise B: V-sit ( hamstrings and adductors) 1. Sit on the floor with your legs extended in a large "V" shape. Keep your knees straight during this exercise. 2. Keeping your head and back in a straight line, bend at your waist to reach for your left foot (position A). You should feel a stretch in your right inner thigh. 3. Hold for __________ seconds. Then slowly return to the upright position. 4. Keeping your head and back in a straight line, bend at your waist to reach forward (position B). You should feel a stretch behind both of your thighs or knees. 5. Hold for __________ seconds. Then slowly return to the upright position. 6. Keeping your head and back in a straight line, bend at your waist to reach for your right foot (position C). You should feel a stretch in your left inner thigh. 7. Hold for __________ seconds. Then slowly return to the upright position. Repeat __________ times. Complete this exercise __________ times a day. Exercise C: Quadriceps, prone  1. Lie on your  abdomen on a firm surface, such as a bed or padded floor. 2. Bend your left / right knee and hold your ankle. If you cannot reach your ankle or pant leg, loop a belt around your foot and grab the belt instead. 3. Gently pull your heel toward your buttocks. Your knee should not slide out to the side. You should feel a stretch in the front of your thigh and knee. 4. Hold this position for __________ seconds. Repeat __________ times. Complete this  stretch __________ times a day. Strengthening exercises These exercises build strength and endurance in your knee and hip. Endurance is the ability to use your muscles for a long time, even after they get tired. Exercise D: Terminal knee extension, quadriceps 1. Secure a long loop of rubber exercise band around a sturdy object like a table leg. 2. Put the band behind your left / right knee. Step back from where the band is secured to put tension on the band. 3. Slowly bend your left / right knee. Keep your left / right foot flat on the floor. 4. Tighten the muscle in the front of your thigh and push back against the band to straighten your knee. 5. Hold this position for __________ seconds. 6. Return to having your left / right knee bent. Repeat __________ times. Complete this stretch __________ times a day. Exercise E: Straight leg raises ( hip abductors) 1. Lie on your side with your left / right leg in the top position. Your head, shoulder, knee, and hip should line up. You may bend your bottom knee to help you balance. 2. Lift your top leg 4-6 inches (10-15 cm) while keeping your toes pointed straight ahead. 3. Hold this position for __________ seconds. 4. Slowly lower your leg to the starting position. 5. Allow your muscles to relax completely after each repetition. Repeat __________ times. Complete this exercise __________ times a day. This information is not intended to replace advice given to you by your health care provider. Make sure you discuss any questions you have with your health care provider. Document Released: 10/12/2005 Document Revised: 06/16/2016 Document Reviewed: 09/27/2015 Elsevier Interactive Patient Education  [employer].

## 2018-05-12 ENCOUNTER — Other Ambulatory Visit: Payer: Self-pay | Admitting: Family Medicine

## 2018-05-12 NOTE — Telephone Encounter (Signed)
Per notes, pt is to make an appt for next refill

## 2018-05-16 ENCOUNTER — Other Ambulatory Visit: Payer: Self-pay | Admitting: Family Medicine

## 2018-05-16 NOTE — Telephone Encounter (Signed)
Copied from [place] [id number]. Topic: Quick Communication - See Telephone Encounter >> May 16, 2018  4:12 PM Mylinda Latina, NT wrote: CRM for notification. See Telephone encounter for: 05/16/18. Patient called and states she needs a refill of her ibuprofen (ADVIL,MOTRIN) 800 MG tablet  [id number] [address], [place]. [phone] (Phone) [phone] (Fax)

## 2018-05-17 NOTE — Telephone Encounter (Signed)
Ibuprofen refill Last OV:05/06/18 with Tenna Delaine Last refill:09/25/17 21 tab/0 refill YEM:[id number] Pharmacy: [id number] [address], [place]. [phone] (Phone) [phone] (Fax)

## 2018-06-06 ENCOUNTER — Encounter: Payer: Self-pay | Admitting: Family Medicine

## 2018-06-06 ENCOUNTER — Other Ambulatory Visit: Payer: Self-pay

## 2018-06-06 ENCOUNTER — Ambulatory Visit: Payer: 59 | Admitting: Family Medicine

## 2018-06-06 VITALS — BP 135/93 | HR 96 | Temp 99.0°F | Resp 17 | Ht 63.39 in | Wt 179.0 lb

## 2018-06-06 DIAGNOSIS — M25562 Pain in left knee: Secondary | ICD-10-CM | POA: Diagnosis not present

## 2018-06-06 DIAGNOSIS — K219 Gastro-esophageal reflux disease without esophagitis: Secondary | ICD-10-CM | POA: Diagnosis not present

## 2018-06-06 DIAGNOSIS — Z170 Estrogen receptor positive status [ER+]: Secondary | ICD-10-CM

## 2018-06-06 DIAGNOSIS — B009 Herpesviral infection, unspecified: Secondary | ICD-10-CM

## 2018-06-06 DIAGNOSIS — I10 Essential (primary) hypertension: Secondary | ICD-10-CM | POA: Diagnosis not present

## 2018-06-06 DIAGNOSIS — G8929 Other chronic pain: Secondary | ICD-10-CM

## 2018-06-06 DIAGNOSIS — C50412 Malignant neoplasm of upper-outer quadrant of left female breast: Secondary | ICD-10-CM | POA: Diagnosis not present

## 2018-06-06 MED ORDER — VALACYCLOVIR HCL 500 MG PO TABS
ORAL_TABLET | ORAL | 2 refills | Status: DC
Start: 2018-06-06 — End: 2019-03-17

## 2018-06-06 MED ORDER — DEXLANSOPRAZOLE 60 MG PO CPDR: 60.0000 mg | DELAYED_RELEASE_CAPSULE | Freq: Every day | ORAL | 1 refills | Status: DC

## 2018-06-06 MED ORDER — IBUPROFEN 800 MG PO TABS: 800.0000 mg | ORAL_TABLET | Freq: Three times a day (TID) | ORAL | 0 refills | Status: DC | PRN

## 2018-06-06 MED ORDER — AMLODIPINE BESYLATE 2.5 MG PO TABS: 2.5000 mg | ORAL_TABLET | Freq: Every day | ORAL | 1 refills | Status: DC

## 2018-06-06 NOTE — Patient Instructions (Signed)
     IF you received an x-ray today, you will receive an invoice from  Radiology. Please contact  Radiology at [phone] with questions or concerns regarding your invoice.   IF you received labwork today, you will receive an invoice from [facility]. Please contact [facility] at [phone] with questions or concerns regarding your invoice.   Our billing staff will not be able to assist you with questions regarding bills from these companies.  You will be contacted with the lab results as soon as they are available. The fastest way to get your results is to activate your My Chart account. Instructions are located on the last page of this paperwork. If you have not heard from us regarding the results in 2 weeks, please contact this office.     

## 2018-06-06 NOTE — Progress Notes (Signed)
8/12/20195:21 PM  April Robinson [birth year], 57 y.o. female [id number]  Chief Complaint  Patient presents with  . f/u on hypertension  . Medication Refill    amlodipine, dexilant, ibuprofen, nolvadex and valtrex    HPI:   Patient is a 57 y.o. female with past medical history significant for HTN, chronic left knee pain, GERD, left breast cancer who presents today for routine followup  Last onc appt 01/2018 with Dr Jana Hakim, plan is tomaxifen for 10 years until 2023, followup in 1 year Referred her to pelvic wellness program due to vaginal dryness, vag estrogen ok Last mammo 01/2018 - benign Does not need refills of tamoxifen at this time Never saw pelvic wellness program, could not find the time They have found an OTC lubricant that works well for them Back to dancing, knee much better, takes ibu prn Checks BP at home, 120-130/80s, denies any side effects gerd well controlled with daily PPI Recent outbreak of genital herpes, needs refill of valtrex as has no more, very stressed of recent, family has had to move in  Overall doing well, has upcoming vacation with just her husband, really looking forward to that  Fall Risk  06/06/2018 05/06/2018 01/13/2018 12/03/2017 11/18/2017  Falls in the past year? No No No No No     Depression screen [facility] 2/9 06/06/2018 05/06/2018 01/13/2018  Decreased Interest 0 0 0  Down, Depressed, Hopeless 0 0 0  PHQ - 2 Score 0 0 0    Allergies  Allergen Reactions  . Meloxicam Shortness Of Breath and Nausea Only  . Miconazole Nitrate Other (See Comments)    SWELLING OF SOFT PALATE THAT CAUSED DIFFICULTY SWALLOWING  . Latex Itching, Rash and Other (See Comments)    Fine, little bumps    Prior to Admission medications   Medication Sig Start Date End Date Taking? Authorizing Provider  albuterol (PROVENTIL HFA;VENTOLIN HFA) 108 (90 Base) MCG/ACT inhaler Inhale 2 puffs into the lungs every 6 (six) hours as needed for wheezing or shortness of breath. 10/12/17   Yes Rutherford Guys, MD  amLODipine (NORVASC) 2.5 MG tablet TAKE 1 TABLET BY MOUTH EVERY DAY 04/13/18  Yes Rutherford Guys, MD  cyclobenzaprine (FLEXERIL) 10 MG tablet Take 1 tablet (10 mg total) by mouth at bedtime. 11/18/17  Yes Rutherford Guys, MD  DEXILANT 60 MG capsule TAKE 1 CAPSULE BY MOUTH EVERY DAY 11/16/17  Yes Roma Schanz R, DO  ibuprofen (ADVIL,MOTRIN) 800 MG tablet Take 1 tablet (800 mg total) by mouth 3 (three) times daily. 09/25/17  Yes Ward, Ozella Almond, PA-C  tamoxifen (NOLVADEX) 20 MG tablet Take 1 tablet (20 mg total) by mouth daily. 02/08/18  Yes Magrinat, Virgie Dad, MD  valACYclovir (VALTREX) 500 MG tablet TAKE ONE TAB TWICE DAILY FOR 3-5 DAYS WITH OUTBREAK. 10/12/17  Yes Rutherford Guys, MD    Past Medical History:  Diagnosis Date  . Alcoholism ([place])   . Anxiety   . Arthritis   . Cancer ([place])    breast L  . Esophageal reflux   . Hiatal hernia   . Hx of radiation therapy 08/27/11 to 10/15/11   L breast  . Hyperlipemia   . Hypertension    past not on any medications currently  . Personal history of colonic polyps    TUBULAR ADENOMA  . Personal history of radiation therapy     Past Surgical History:  Procedure Laterality Date  . ABDOMINAL HYSTERECTOMY     partial for DUB  .  BREAST LUMPECTOMY  07/23/11   left breast  . COLONOSCOPY    . LAPAROSCOPIC HYSTERECTOMY  1995    Social History   Tobacco Use  . Smoking status: Former Smoker    Types: Cigarettes    Last attempt to quit: 1983    Years since quitting: 36.6  . Smokeless tobacco: Never Used  Substance Use Topics  . Alcohol use: Yes    Alcohol/week: 0.0 standard drinks    Comment: rare    Family History  Problem Relation Age of Onset  . Breast cancer Mother   . Cancer Mother        breast  . Hyperlipidemia Mother   . Diabetes Father   . Hypertension Father   . Cancer Father   . Hyperlipidemia Father   . Cancer Brother        NEUROENDOCRINE  . Hypertension Brother   . Arthritis  Brother        hips  . Colon cancer Neg Hx     Review of Systems  Constitutional: Negative for chills and fever.  Respiratory: Negative for cough and shortness of breath.   Cardiovascular: Negative for chest pain, palpitations and leg swelling.  Gastrointestinal: Negative for abdominal pain, nausea and vomiting.     OBJECTIVE:  Blood pressure (!) 135/93, pulse 96, temperature 99 F (37.2 C), temperature source Oral, resp. rate 17, height 5' 3.39" (1.61 m), weight 179 lb (81.2 kg), SpO2 96 %. Body mass index is 31.32 kg/m.   Wt Readings from Last 3 Encounters:  06/06/18 179 lb (81.2 kg)  05/06/18 178 lb (80.7 kg)  02/08/18 182 lb 8 oz (82.8 kg)    BP Readings from Last 3 Encounters:  06/06/18 (!) 135/93  05/06/18 125/86  02/08/18 131/70    Physical Exam  Constitutional: She is oriented to person, place, and time. She appears well-developed and well-nourished.  HENT:  Head: Normocephalic and atraumatic.  Mouth/Throat: Oropharynx is clear and moist. No oropharyngeal exudate.  Eyes: Pupils are equal, round, and reactive to light. EOM are normal. No scleral icterus.  Neck: Neck supple.  Cardiovascular: Normal rate, regular rhythm and normal heart sounds. Exam reveals no gallop and no friction rub.  No murmur heard. Pulmonary/Chest: Effort normal and breath sounds normal. She has no wheezes. She has no rales.  Musculoskeletal: She exhibits no edema.  Neurological: She is alert and oriented to person, place, and time.  Skin: Skin is warm and dry.  Psychiatric: She has a normal mood and affect.  Nursing note and vitals reviewed.   ASSESSMENT and PLAN  1. Essential hypertension Elevated today but previous recent readings and at home BP has been at goal. Therefore will not adjust meds today. Continue with diet and exercise. Re-eval at next visit. - Comprehensive metabolic panel - TSH - Lipid panel - Care order/instruction:  2. Gastroesophageal reflux disease,  esophagitis presence not specified Controlled. Continue current regime.   3. Malignant neoplasm of upper-outer quadrant of left breast in female, estrogen receptor positive ([place]) - CBC - Comprehensive metabolic panel managed by oncology. followup in April 2020. If refill needed I will be willing to refill until yearly followup  4. Chronic pain of left knee Controlled. Continue current regime.   5. Herpes simplex infection Recent outbreak resolved. Valtrex refilled  Other orders - amLODipine (NORVASC) 2.5 MG tablet; Take 1 tablet (2.5 mg total) by mouth daily. - dexlansoprazole (DEXILANT) 60 MG capsule; Take 1 capsule (60 mg total) by mouth daily. - ibuprofen (  ADVIL,MOTRIN) 800 MG tablet; Take 1 tablet (800 mg total) by mouth 3 (three) times daily as needed. - valACYclovir (VALTREX) 500 MG tablet; TAKE ONE TAB TWICE DAILY FOR 3-5 DAYS WITH OUTBREAK.  Return in about 6 months (around 12/07/2018).    Rutherford Guys, MD [facility] at [facility] [place],  [address] Ph.  [phone] Fax [phone]

## 2018-06-07 LAB — CBC
Hematocrit: 41.8 % (ref 34.0–46.6)
Hemoglobin: 13.8 g/dL (ref 11.1–15.9)
MCH: 28.8 pg (ref 26.6–33.0)
MCHC: 33 g/dL (ref 31.5–35.7)
MCV: 87 fL (ref 79–97)
Platelets: 269 10*3/uL (ref 150–450)
RBC: 4.79 x10E6/uL (ref 3.77–5.28)
RDW: 15 % (ref 12.3–15.4)
WBC: 6.2 10*3/uL (ref 3.4–10.8)

## 2018-06-07 LAB — COMPREHENSIVE METABOLIC PANEL
ALT: 17 IU/L (ref 0–32)
AST: 16 IU/L (ref 0–40)
Albumin/Globulin Ratio: 1.8 (ref 1.2–2.2)
Albumin: 4.4 g/dL (ref 3.5–5.5)
Alkaline Phosphatase: 64 IU/L (ref 39–117)
BUN/Creatinine Ratio: 14 (ref 9–23)
BUN: 17 mg/dL (ref 6–24)
Bilirubin Total: <0.2 mg/dL (ref 0.0–1.2)
CO2: 22 mmol/L (ref 20–29)
Calcium: 9.3 mg/dL (ref 8.7–10.2)
Chloride: 105 mmol/L (ref 96–106)
Creatinine, Ser: 1.18 mg/dL — ABNORMAL HIGH (ref 0.57–1.00)
GFR calc Af Amer: 59 mL/min/{1.73_m2} — ABNORMAL LOW (ref >59)
GFR calc non Af Amer: 51 mL/min/{1.73_m2} — ABNORMAL LOW (ref >59)
Globulin, Total: 2.4 g/dL (ref 1.5–4.5)
Glucose: 94 mg/dL (ref 65–99)
Potassium: 4.4 mmol/L (ref 3.5–5.2)
Sodium: 144 mmol/L (ref 134–144)
Total Protein: 6.8 g/dL (ref 6.0–8.5)

## 2018-06-07 LAB — LIPID PANEL
Chol/HDL Ratio: 4 ratio (ref 0.0–4.4)
Cholesterol, Total: 196 mg/dL (ref 100–199)
HDL: 49 mg/dL (ref >39)
LDL Calculated: 105 mg/dL — ABNORMAL HIGH (ref 0–99)
Triglycerides: 208 mg/dL — ABNORMAL HIGH (ref 0–149)
VLDL Cholesterol Cal: 42 mg/dL — ABNORMAL HIGH (ref 5–40)

## 2018-06-07 LAB — TSH: TSH: 1.21 u[IU]/mL (ref 0.450–4.500)

## 2018-08-23 DIAGNOSIS — Z23 Encounter for immunization: Secondary | ICD-10-CM | POA: Diagnosis not present

## 2018-09-17 ENCOUNTER — Other Ambulatory Visit: Payer: Self-pay | Admitting: Physician Assistant

## 2018-09-17 DIAGNOSIS — H168 Other keratitis: Secondary | ICD-10-CM

## 2018-10-06 ENCOUNTER — Telehealth: Payer: Self-pay | Admitting: Family Medicine

## 2018-10-06 NOTE — Telephone Encounter (Signed)
Tried to call pt regarding appt on 12/09/18 with Dr. Pamella Pert. Due to provider schedule change, pt will need to reschedule. No message was left due to VM being full. If pt calls back, please reschedule pt.

## 2018-10-15 ENCOUNTER — Emergency Department (HOSPITAL_COMMUNITY): Payer: 59

## 2018-10-15 ENCOUNTER — Emergency Department (HOSPITAL_COMMUNITY)
Admission: EM | Admit: 2018-10-15 | Discharge: 2018-10-15 | Disposition: A | Discharge status: Home / Self Care | Payer: 59 | Attending: Emergency Medicine | Admitting: Emergency Medicine

## 2018-10-15 DIAGNOSIS — Z91040 Latex allergy status: Secondary | ICD-10-CM | POA: Insufficient documentation

## 2018-10-15 DIAGNOSIS — I10 Essential (primary) hypertension: Secondary | ICD-10-CM | POA: Diagnosis not present

## 2018-10-15 DIAGNOSIS — Z87891 Personal history of nicotine dependence: Secondary | ICD-10-CM | POA: Insufficient documentation

## 2018-10-15 DIAGNOSIS — M5489 Other dorsalgia: Secondary | ICD-10-CM | POA: Diagnosis not present

## 2018-10-15 DIAGNOSIS — Z79899 Other long term (current) drug therapy: Secondary | ICD-10-CM | POA: Diagnosis not present

## 2018-10-15 DIAGNOSIS — M62830 Muscle spasm of back: Secondary | ICD-10-CM | POA: Insufficient documentation

## 2018-10-15 DIAGNOSIS — M545 Low back pain: Secondary | ICD-10-CM | POA: Diagnosis present

## 2018-10-15 DIAGNOSIS — R0902 Hypoxemia: Secondary | ICD-10-CM | POA: Diagnosis not present

## 2018-10-15 MED ORDER — DIAZEPAM 5 MG PO TABS: 5.0000 mg | ORAL_TABLET | Freq: Two times a day (BID) | ORAL | 0 refills | Status: DC

## 2018-10-15 MED ORDER — CYCLOBENZAPRINE HCL 10 MG PO TABS
10.0000 mg | ORAL_TABLET | Freq: Once | ORAL | Status: AC
  Administered 2018-10-15: 10 mg via ORAL
  Filled 2018-10-15: qty 1

## 2018-10-15 MED ORDER — PREDNISONE 50 MG PO TABS: ORAL_TABLET | ORAL | 0 refills | Status: DC

## 2018-10-15 MED ORDER — DIAZEPAM 5 MG/ML IJ SOLN
2.5000 mg | Freq: Once | INTRAMUSCULAR | Status: AC
  Administered 2018-10-15: 2.5 mg via INTRAVENOUS
  Filled 2018-10-15: qty 2

## 2018-10-15 NOTE — Discharge Instructions (Addendum)
Do not drive while taking the valium as it will make you sleepy. Follow up with your doctor or return here as needed for worsening symptoms.

## 2018-10-15 NOTE — ED Provider Notes (Signed)
[facility] DEPT Provider Note   CSN: [id number] Arrival date & time: 10/15/18  1529     History   Chief Complaint Chief Complaint  Patient presents with  . Back Pain    HPI April Robinson is a 57 y.o. female who presents to the ED with back pain. The pain started suddenly when the patient reached for a pot under the cabinet and and then opened the oven door and when she stood up it felt like her back went into spasm. Patient arrived via EMS and was given fentanyl 50 mcg in route to the ED which patient states it did help some.   The history is provided by the patient. No language interpreter was used.  Back Pain   The current episode started 1 to 2 hours ago. The problem occurs constantly. The pain is present in the lumbar spine. The quality of the pain is described as shooting. The pain radiates to the left thigh. The pain is severe. Associated symptoms include leg pain. Pertinent negatives include no chest pain, no fever, no abdominal pain, no bowel incontinence, no bladder incontinence, no dysuria and no weakness. She has tried nothing for the symptoms.    Past Medical History:  Diagnosis Date  . Alcoholism ([place])   . Anxiety   . Arthritis   . Cancer ([place])    breast L  . Esophageal reflux   . Hiatal hernia   . Hx of radiation therapy 08/27/11 to 10/15/11   L breast  . Hyperlipemia   . Hypertension    past not on any medications currently  . Personal history of colonic polyps    TUBULAR ADENOMA  . Personal history of radiation therapy     Patient Active Problem List   Diagnosis Date Noted  . Tendonitis, Achilles, right 08/05/2017  . Left knee pain 04/23/2015  . Knee pain, left 04/11/2015  . Popliteal pain 04/11/2015  . Hypertension 10/17/2014  . Acute pharyngitis 09/04/2014  . Left shoulder pain 02/19/2014  . Benign paroxysmal positional vertigo 09/29/2013  . Hot flashes due to tamoxifen 08/16/2013  . Hx of vaginitis 04/15/2012    . De Quervain's disease (tenosynovitis) 04/05/2012  . Malignant neoplasm of upper-outer quadrant of left breast in female, estrogen receptor positive ([place])   . IBS 12/03/2010  . ELEVATED BP READING WITHOUT DX HYPERTENSION 12/03/2010  . NECK SPRAIN AND STRAIN 12/03/2010  . ANXIETY STATE, UNSPECIFIED 11/07/2010  . ADD 11/07/2010  . GERD 11/07/2010  . CHEST PAIN UNSPECIFIED 11/07/2010    Past Surgical History:  Procedure Laterality Date  . ABDOMINAL HYSTERECTOMY     partial for DUB  . BREAST LUMPECTOMY  07/23/11   left breast  . COLONOSCOPY    . LAPAROSCOPIC HYSTERECTOMY  1995     OB History    Gravida  3   Para  3   Term      Preterm      AB      Living  3     SAB      TAB      Ectopic      Multiple      Live Births               Home Medications    Prior to Admission medications   Medication Sig Start Date End Date Taking? Authorizing Provider  albuterol (PROVENTIL HFA;VENTOLIN HFA) 108 (90 Base) MCG/ACT inhaler Inhale 2 puffs into the lungs every 6 (six) hours  as needed for wheezing or shortness of breath. 10/12/17   Rutherford Guys, MD  amLODipine (NORVASC) 2.5 MG tablet Take 1 tablet (2.5 mg total) by mouth daily. 06/06/18   Rutherford Guys, MD  dexlansoprazole (DEXILANT) 60 MG capsule Take 1 capsule (60 mg total) by mouth daily. 06/06/18   Rutherford Guys, MD  diazepam (VALIUM) 5 MG tablet Take 1 tablet (5 mg total) by mouth 2 (two) times daily. 10/15/18   Malvin Johns, MD  ibuprofen (ADVIL,MOTRIN) 800 MG tablet Take 1 tablet (800 mg total) by mouth 3 (three) times daily as needed. 06/06/18   Rutherford Guys, MD  predniSONE (DELTASONE) 50 MG tablet Take one tablet PO daily. 10/15/18   Malvin Johns, MD  tamoxifen (NOLVADEX) 20 MG tablet Take 1 tablet (20 mg total) by mouth daily. 02/08/18   Magrinat, Virgie Dad, MD  triamcinolone (KENALOG) 0.025 % ointment APPLY TOPICALLY 2 TIMES DAILY AS NEEDED FOR ITCHING 09/18/18   Forrest Moron, MD   valACYclovir (VALTREX) 500 MG tablet TAKE ONE TAB TWICE DAILY FOR 3-5 DAYS WITH OUTBREAK. 06/06/18   Rutherford Guys, MD    Family History Family History  Problem Relation Age of Onset  . Breast cancer Mother   . Cancer Mother        breast  . Hyperlipidemia Mother   . Diabetes Father   . Hypertension Father   . Cancer Father   . Hyperlipidemia Father   . Cancer Brother        NEUROENDOCRINE  . Hypertension Brother   . Arthritis Brother        hips  . Colon cancer Neg Hx     Social History Social History   Tobacco Use  . Smoking status: Former Smoker    Types: Cigarettes    Last attempt to quit: 1983    Years since quitting: 36.9  . Smokeless tobacco: Never Used  Substance Use Topics  . Alcohol use: Yes    Alcohol/week: 0.0 standard drinks    Comment: rare  . Drug use: No     Allergies   Meloxicam; Miconazole nitrate; and Latex   Review of Systems Review of Systems  Constitutional: Negative for fever.  HENT: Negative.   Cardiovascular: Negative for chest pain.  Gastrointestinal: Negative for abdominal pain, bowel incontinence and nausea.  Genitourinary: Negative for bladder incontinence and dysuria.  Musculoskeletal: Positive for arthralgias and back pain.  Skin: Negative for rash.  Neurological: Negative for syncope and weakness.  All other systems reviewed and are negative.    Physical Exam Updated Vital Signs BP 128/74 (BP Location: Right Arm) Comment (BP Location): Previous documentation "LT arm" is incorrect.  RT arm was used.  Pulse 69   Resp 16   SpO2 100%   Physical Exam Vitals signs and nursing note reviewed.  Constitutional:      General: She is not in acute distress.    Appearance: She is well-developed.  HENT:     Head: Normocephalic and atraumatic.     Nose: Nose normal.  Eyes:     Conjunctiva/sclera: Conjunctivae normal.  Neck:     Musculoskeletal: Normal range of motion and neck supple.  Cardiovascular:     Rate and Rhythm:  Normal rate.  Pulmonary:     Effort: Pulmonary effort is normal.  Abdominal:     Palpations: Abdomen is soft.     Tenderness: There is no abdominal tenderness.  Musculoskeletal:     Lumbar back: She exhibits  tenderness and spasm. She exhibits no deformity, no laceration and normal pulse. Decreased range of motion: due to pain.       Back:  Skin:    General: Skin is warm and dry.  Neurological:     Mental Status: She is alert and oriented to person, place, and time.     Cranial Nerves: No cranial nerve deficit.     Sensory: Sensation is intact.     Deep Tendon Reflexes:     Reflex Scores:      Bicep reflexes are 2+ on the right side and 2+ on the left side.      Brachioradialis reflexes are 2+ on the right side and 2+ on the left side.      Patellar reflexes are 2+ on the right side and 2+ on the left side.    Comments: Patient with difficulty due to pain.   Psychiatric:        Mood and Affect: Mood normal.    Pain relieved with Valium  ED Treatments / Results  Labs (all labs ordered are listed, but only abnormal results are displayed) Labs Reviewed - No data to display  Radiology Dg Lumbar Spine Complete  Result Date: 10/15/2018 CLINICAL DATA:  Left side low back pain. EXAM: LUMBAR SPINE - COMPLETE 4+ VIEW COMPARISON:  None. FINDINGS: There is no evidence of lumbar spine fracture. Alignment is normal. Intervertebral disc spaces are maintained. IMPRESSION: Negative. Electronically Signed   By: Misty Stanley M.D.   On: 10/15/2018 18:46    Procedures Procedures (including critical care time)  Medications Ordered in ED Medications  cyclobenzaprine (FLEXERIL) tablet 10 mg (10 mg Oral Given 10/15/18 1721)  diazepam (VALIUM) injection 2.5 mg (2.5 mg Intravenous Given 10/15/18 2011)     Initial Impression / Assessment and Plan / ED Course  I have reviewed the triage vital signs and the nursing notes.  Patient with back pain.  No neurological deficits and normal neuro  exam.  Patient can walk but states is painful.  No loss of bowel or bladder control.  No concern for cauda equina.  No fever, night sweats, weight loss, h/o cancer, IVDU.  RICE protocol and pain medicine indicated and discussed with patient.  Final Clinical Impressions(s) / ED Diagnoses   Final diagnoses:  Muscle spasm of back    ED Discharge Orders         Ordered    diazepam (VALIUM) 5 MG tablet  2 times daily,   Status:  Discontinued     10/15/18 2055    predniSONE (DELTASONE) 50 MG tablet  Status:  Discontinued     10/15/18 2055    diazepam (VALIUM) 5 MG tablet  2 times daily     10/15/18 2210    predniSONE (DELTASONE) 50 MG tablet     10/15/18 2210           Debroah Baller Leola, NP 10/16/18 1715    Malvin Johns, MD 10/16/18 1825

## 2018-10-15 NOTE — ED Triage Notes (Signed)
Pt arrived via EMS, pt injured back at home while reaching for pot. Pt given 50 mcg Fentanyl by EMS. Pt c/o back spasms.

## 2018-11-19 ENCOUNTER — Encounter: Payer: Self-pay | Admitting: Emergency Medicine

## 2018-11-19 ENCOUNTER — Other Ambulatory Visit: Payer: Self-pay

## 2018-11-19 ENCOUNTER — Ambulatory Visit
Admission: EM | Admit: 2018-11-19 | Discharge: 2018-11-19 | Disposition: A | Discharge status: Home / Self Care | Payer: 59 | Attending: Family Medicine | Admitting: Family Medicine

## 2018-11-19 DIAGNOSIS — J069 Acute upper respiratory infection, unspecified: Secondary | ICD-10-CM

## 2018-11-19 DIAGNOSIS — B9789 Other viral agents as the cause of diseases classified elsewhere: Secondary | ICD-10-CM | POA: Diagnosis not present

## 2018-11-19 LAB — POCT INFLUENZA A/B
Influenza A, POC: NEGATIVE
Influenza B, POC: NEGATIVE

## 2018-11-19 MED ORDER — GUAIFENESIN ER 600 MG PO TB12: 600.0000 mg | ORAL_TABLET | Freq: Two times a day (BID) | ORAL | 0 refills | Status: DC

## 2018-11-19 MED ORDER — BENZONATATE 100 MG PO CAPS: 100.0000 mg | ORAL_CAPSULE | Freq: Three times a day (TID) | ORAL | 0 refills | Status: DC

## 2018-11-19 NOTE — Discharge Instructions (Signed)
Your flu test was negative here This is most likely a viral upper respiratory infection Mucinex twice a day for cough and congestion Tessalon Perles every 8 hours as needed for worsening cough You can do Tylenol or ibuprofen for body aches and fever Follow up as needed for continued or worsening symptoms

## 2018-11-19 NOTE — ED Provider Notes (Signed)
[facility]    CSN: [id number] Arrival date & time: 11/19/18  5009     History   Chief Complaint Chief Complaint  Patient presents with  . Cough  . Nasal Congestion    HPI April Robinson is a 58 y.o. female.   Patient is a 58 year old female who presents with cough, congestion, rhinorrhea, myalgias since yesterday.  Her symptoms been constant and worsening.  She has been taking Tussionex DM and Zyrtec-D for symptoms.  She has had some chills but denies any fevers.  She has had some recent sick contacts at work.  ROS per HPI      Past Medical History:  Diagnosis Date  . Alcoholism ([place])   . Anxiety   . Arthritis   . Cancer ([place])    breast L  . Esophageal reflux   . Hiatal hernia   . Hx of radiation therapy 08/27/11 to 10/15/11   L breast  . Hyperlipemia   . Hypertension    past not on any medications currently  . Personal history of colonic polyps    TUBULAR ADENOMA  . Personal history of radiation therapy     Patient Active Problem List   Diagnosis Date Noted  . Tendonitis, Achilles, right 08/05/2017  . Left knee pain 04/23/2015  . Knee pain, left 04/11/2015  . Popliteal pain 04/11/2015  . Hypertension 10/17/2014  . Acute pharyngitis 09/04/2014  . Left shoulder pain 02/19/2014  . Benign paroxysmal positional vertigo 09/29/2013  . Hot flashes due to tamoxifen 08/16/2013  . Hx of vaginitis 04/15/2012  . De Quervain's disease (tenosynovitis) 04/05/2012  . Malignant neoplasm of upper-outer quadrant of left breast in female, estrogen receptor positive ([place])   . IBS 12/03/2010  . ELEVATED BP READING WITHOUT DX HYPERTENSION 12/03/2010  . NECK SPRAIN AND STRAIN 12/03/2010  . ANXIETY STATE, UNSPECIFIED 11/07/2010  . ADD 11/07/2010  . GERD 11/07/2010  . CHEST PAIN UNSPECIFIED 11/07/2010    Past Surgical History:  Procedure Laterality Date  . ABDOMINAL HYSTERECTOMY     partial for DUB  . BREAST LUMPECTOMY  07/23/11   left breast  .  COLONOSCOPY    . LAPAROSCOPIC HYSTERECTOMY  1995    OB History    Gravida  3   Para  3   Term      Preterm      AB      Living  3     SAB      TAB      Ectopic      Multiple      Live Births               Home Medications    Prior to Admission medications   Medication Sig Start Date End Date Taking? Authorizing Provider  albuterol (PROVENTIL HFA;VENTOLIN HFA) 108 (90 Base) MCG/ACT inhaler Inhale 2 puffs into the lungs every 6 (six) hours as needed for wheezing or shortness of breath. 10/12/17   Rutherford Guys, MD  amLODipine (NORVASC) 2.5 MG tablet Take 1 tablet (2.5 mg total) by mouth daily. 06/06/18   Rutherford Guys, MD  benzonatate (TESSALON) 100 MG capsule Take 1 capsule (100 mg total) by mouth every 8 (eight) hours. 11/19/18   Loura Halt A, NP  dexlansoprazole (DEXILANT) 60 MG capsule Take 1 capsule (60 mg total) by mouth daily. 06/06/18   Rutherford Guys, MD  guaiFENesin (MUCINEX) 600 MG 12 hr tablet Take 1 tablet (600 mg total) by mouth  2 (two) times daily. 11/19/18   Loura Halt A, NP  ibuprofen (ADVIL,MOTRIN) 800 MG tablet Take 1 tablet (800 mg total) by mouth 3 (three) times daily as needed. 06/06/18   Rutherford Guys, MD  tamoxifen (NOLVADEX) 20 MG tablet Take 1 tablet (20 mg total) by mouth daily. 02/08/18   Magrinat, Virgie Dad, MD  triamcinolone (KENALOG) 0.025 % ointment APPLY TOPICALLY 2 TIMES DAILY AS NEEDED FOR ITCHING 09/18/18   Forrest Moron, MD  valACYclovir (VALTREX) 500 MG tablet TAKE ONE TAB TWICE DAILY FOR 3-5 DAYS WITH OUTBREAK. 06/06/18   Rutherford Guys, MD    Family History Family History  Problem Relation Age of Onset  . Breast cancer Mother   . Cancer Mother        breast  . Hyperlipidemia Mother   . Diabetes Father   . Hypertension Father   . Cancer Father   . Hyperlipidemia Father   . Cancer Brother        NEUROENDOCRINE  . Hypertension Brother   . Arthritis Brother        hips  . Colon cancer Neg Hx     Social  History Social History   Tobacco Use  . Smoking status: Former Smoker    Types: Cigarettes    Last attempt to quit: 1983    Years since quitting: 37.0  . Smokeless tobacco: Never Used  Substance Use Topics  . Alcohol use: Yes    Alcohol/week: 0.0 standard drinks    Comment: rare  . Drug use: No     Allergies   Meloxicam; Miconazole nitrate; and Latex   Review of Systems Review of Systems   Physical Exam Triage Vital Signs ED Triage Vitals  Enc Vitals Group     BP 11/19/18 0932 (!) 142/110     Pulse Rate 11/19/18 0932 (!) 110     Resp --      Temp 11/19/18 0932 98 F (36.7 C)     Temp Source 11/19/18 0932 Oral     SpO2 11/19/18 0932 94 %     Weight 11/19/18 0933 181 lb (82.1 kg)     Height 11/19/18 0933 5\' 2"  (1.575 m)     Head Circumference --      Peak Flow --      Pain Score 11/19/18 0932 1     Pain Loc --      Pain Edu? --      Excl. in [place]? --    No data found.  Updated Vital Signs BP (!) 142/110 (BP Location: Right Arm)   Pulse (!) 110   Temp 98 F (36.7 C) (Oral)   Ht 5\' 2"  (1.575 m)   Wt 181 lb (82.1 kg)   SpO2 94%   BMI 33.11 kg/m   Visual Acuity Right Eye Distance:   Left Eye Distance:   Bilateral Distance:    Right Eye Near:   Left Eye Near:    Bilateral Near:     Physical Exam Vitals signs and nursing note reviewed.  Constitutional:      General: She is not in acute distress.    Appearance: She is well-developed. She is ill-appearing.  HENT:     Head: Normocephalic and atraumatic.     Right Ear: Tympanic membrane and ear canal normal.     Left Ear: Tympanic membrane and ear canal normal.     Nose: Congestion and rhinorrhea present.     Mouth/Throat:  Pharynx: Posterior oropharyngeal erythema present.  Eyes:     Conjunctiva/sclera: Conjunctivae normal.  Neck:     Musculoskeletal: Neck supple.  Cardiovascular:     Rate and Rhythm: Normal rate and regular rhythm.     Heart sounds: No murmur.  Pulmonary:     Effort:  Pulmonary effort is normal. No respiratory distress.     Breath sounds: Normal breath sounds.  Abdominal:     Palpations: Abdomen is soft.     Tenderness: There is no abdominal tenderness.  Skin:    General: Skin is warm and dry.  Neurological:     Mental Status: She is alert.      UC Treatments / Results  Labs (all labs ordered are listed, but only abnormal results are displayed) Labs Reviewed  POCT INFLUENZA A/B    EKG None  Radiology No results found.  Procedures Procedures (including critical care time)  Medications Ordered in UC Medications - No data to display  Initial Impression / Assessment and Plan / UC Course  I have reviewed the triage vital signs and the nursing notes.  Pertinent labs & imaging results that were available during my care of the patient were reviewed by me and considered in my medical decision making (see chart for details).     Patient rapid flu test negative here in clinic Lungs clear This is most likely viral upper respiratory infection We will have her do Mucinex for cough and congestion Tessalon Perles for worsening cough She can use the albuterol inhaler she has for cough, wheezing, shortness of breath Follow up as needed for continued or worsening symptoms   Final Clinical Impressions(s) / UC Diagnoses   Final diagnoses:  Viral URI with cough     Discharge Instructions     Your flu test was negative here This is most likely a viral upper respiratory infection Mucinex twice a day for cough and congestion Tessalon Perles every 8 hours as needed for worsening cough You can do Tylenol or ibuprofen for body aches and fever Follow up as needed for continued or worsening symptoms     ED Prescriptions    Medication Sig Dispense Auth. Provider   guaiFENesin (MUCINEX) 600 MG 12 hr tablet Take 1 tablet (600 mg total) by mouth 2 (two) times daily. 15 tablet Harlo Fabela A, NP   benzonatate (TESSALON) 100 MG capsule Take 1  capsule (100 mg total) by mouth every 8 (eight) hours. 21 capsule Loura Halt A, NP     Controlled Substance Prescriptions [place] Controlled Substance Registry consulted? Not Applicable   Orvan July, NP 11/19/18 1021

## 2018-11-19 NOTE — ED Triage Notes (Signed)
Per pt she has been having cough congestion sore throat and chills since yesterday am. Pt has no fever at this time. Pt is taking over the counter cough and congestion medications.

## 2018-11-22 ENCOUNTER — Other Ambulatory Visit: Payer: Self-pay

## 2018-11-22 ENCOUNTER — Ambulatory Visit: Payer: 59 | Admitting: Family Medicine

## 2018-11-22 ENCOUNTER — Encounter: Payer: Self-pay | Admitting: Family Medicine

## 2018-11-22 VITALS — BP 135/84 | HR 95 | Temp 99.0°F | Resp 16 | Wt 178.0 lb

## 2018-11-22 DIAGNOSIS — J101 Influenza due to other identified influenza virus with other respiratory manifestations: Secondary | ICD-10-CM | POA: Diagnosis not present

## 2018-11-22 DIAGNOSIS — R6889 Other general symptoms and signs: Secondary | ICD-10-CM | POA: Diagnosis not present

## 2018-11-22 LAB — POCT RAPID STREP A (OFFICE): Rapid Strep A Screen: NEGATIVE

## 2018-11-22 LAB — POC INFLUENZA A&B (BINAX/QUICKVUE)
Influenza A, POC: POSITIVE — AB
Influenza B, POC: NEGATIVE

## 2018-11-22 MED ORDER — GUAIFENESIN-CODEINE 100-10 MG/5ML PO SOLN: 5.0000 mL | Freq: Four times a day (QID) | ORAL | 0 refills | Status: DC | PRN

## 2018-11-22 NOTE — Progress Notes (Signed)
1/28/20209:44 AM  April Robinson 1961-07-02, 58 y.o. female [id number]  Chief Complaint  Patient presents with  . Cough    chronic with some sorethroat and congestion   . Fatigue    slight fever and weakness     HPI:   Patient is a 58 y.o. female with past medical history significant for HTN and GERD who presents today for cough, sore throat, fever for 4 days  Last fever yesterday, 101 Last ibu last night No SOB Drinking fluids ok, normal UOP No diarrhea tessalon pearls and mucinex not helping much Had flu vaccine this season + flu exposure at work  Fall Risk  11/22/2018 06/06/2018 06/06/2018 05/06/2018 01/13/2018  Falls in the past year? 0 Yes No No No  Number falls in past yr: - 1 - - -  Injury with Fall? 0 No - - -     Depression screen [facility] 2/9 11/22/2018 06/06/2018 06/06/2018  Decreased Interest 0 1 0  Down, Depressed, Hopeless 0 0 0  PHQ - 2 Score 0 1 0    Allergies  Allergen Reactions  . Meloxicam Shortness Of Breath and Nausea Only  . Miconazole Nitrate Other (See Comments)    SWELLING OF SOFT PALATE THAT CAUSED DIFFICULTY SWALLOWING  . Latex Itching, Rash and Other (See Comments)    Fine, little bumps    Prior to Admission medications   Medication Sig Start Date End Date Taking? Authorizing Provider  albuterol (PROVENTIL HFA;VENTOLIN HFA) 108 (90 Base) MCG/ACT inhaler Inhale 2 puffs into the lungs every 6 (six) hours as needed for wheezing or shortness of breath. 10/12/17   Rutherford Guys, MD  amLODipine (NORVASC) 2.5 MG tablet Take 1 tablet (2.5 mg total) by mouth daily. 06/06/18   Rutherford Guys, MD  benzonatate (TESSALON) 100 MG capsule Take 1 capsule (100 mg total) by mouth every 8 (eight) hours. 11/19/18   Loura Halt A, NP  dexlansoprazole (DEXILANT) 60 MG capsule Take 1 capsule (60 mg total) by mouth daily. 06/06/18   Rutherford Guys, MD  guaiFENesin (MUCINEX) 600 MG 12 hr tablet Take 1 tablet (600 mg total) by mouth 2 (two) times daily. 11/19/18    Loura Halt A, NP  ibuprofen (ADVIL,MOTRIN) 800 MG tablet Take 1 tablet (800 mg total) by mouth 3 (three) times daily as needed. 06/06/18   Rutherford Guys, MD  tamoxifen (NOLVADEX) 20 MG tablet Take 1 tablet (20 mg total) by mouth daily. 02/08/18   Magrinat, Virgie Dad, MD  triamcinolone (KENALOG) 0.025 % ointment APPLY TOPICALLY 2 TIMES DAILY AS NEEDED FOR ITCHING 09/18/18   Forrest Moron, MD  valACYclovir (VALTREX) 500 MG tablet TAKE ONE TAB TWICE DAILY FOR 3-5 DAYS WITH OUTBREAK. 06/06/18   Rutherford Guys, MD    Past Medical History:  Diagnosis Date  . Alcoholism ([place])   . Anxiety   . Arthritis   . Cancer ([place])    breast L  . Esophageal reflux   . Hiatal hernia   . Hx of radiation therapy 08/27/11 to 10/15/11   L breast  . Hyperlipemia   . Hypertension    past not on any medications currently  . Personal history of colonic polyps    TUBULAR ADENOMA  . Personal history of radiation therapy     Past Surgical History:  Procedure Laterality Date  . ABDOMINAL HYSTERECTOMY     partial for DUB  . BREAST LUMPECTOMY  07/23/11   left breast  . COLONOSCOPY    .  LAPAROSCOPIC HYSTERECTOMY  1995    Social History   Tobacco Use  . Smoking status: Former Smoker    Types: Cigarettes    Last attempt to quit: 1983    Years since quitting: 37.0  . Smokeless tobacco: Never Used  Substance Use Topics  . Alcohol use: Yes    Alcohol/week: 0.0 standard drinks    Comment: rare    Family History  Problem Relation Age of Onset  . Breast cancer Mother   . Cancer Mother        breast  . Hyperlipidemia Mother   . Diabetes Father   . Hypertension Father   . Cancer Father   . Hyperlipidemia Father   . Cancer Brother        NEUROENDOCRINE  . Hypertension Brother   . Arthritis Brother        hips  . Colon cancer Neg Hx     ROS Per hpi  OBJECTIVE:  Blood pressure 135/84, pulse 95, temperature 99 F (37.2 C), temperature source Oral, resp. rate 16, weight 178 lb (80.7 kg),  SpO2 98 %. Body mass index is 32.56 kg/m.   Physical Exam Vitals signs and nursing note reviewed.  Constitutional:      Appearance: She is well-developed.  HENT:     Head: Normocephalic and atraumatic.     Right Ear: Hearing, tympanic membrane, ear canal and external ear normal.     Left Ear: Hearing, tympanic membrane, ear canal and external ear normal.  Eyes:     Conjunctiva/sclera: Conjunctivae normal.     Pupils: Pupils are equal, round, and reactive to light.  Neck:     Musculoskeletal: Neck supple.  Cardiovascular:     Rate and Rhythm: Normal rate and regular rhythm.     Heart sounds: Normal heart sounds. No murmur. No friction rub. No gallop.   Pulmonary:     Effort: Pulmonary effort is normal.     Breath sounds: Normal breath sounds. No wheezing or rales.  Lymphadenopathy:     Cervical: No cervical adenopathy.  Skin:    General: Skin is warm and dry.  Neurological:     Mental Status: She is alert and oriented to person, place, and time.     Results for orders placed or performed in visit on 11/22/18 (from the past 24 hour(s))  POCT rapid strep A     Status: None   Collection Time: 11/22/18  9:20 AM  Result Value Ref Range   Rapid Strep A Screen Negative Negative  POC Influenza A&B(BINAX/QUICKVUE)     Status: Abnormal   Collection Time: 11/22/18  9:27 AM  Result Value Ref Range   Influenza A, POC Positive (A) Negative   Influenza B, POC Negative Negative    ASSESSMENT and PLAN  1. Influenza A Discussed supportive measures, new meds r/se/b and RTC precautions. Patient educational handout given.  2. Flu-like symptoms - POC Influenza A&B(BINAX/QUICKVUE) - POCT rapid strep A  Other orders - guaiFENesin-codeine 100-10 MG/5ML syrup; Take 5 mLs by mouth every 6 (six) hours as needed for cough.    Return if symptoms worsen or fail to improve.    Rutherford Guys, MD [facility] [place], [place] [address] Ph.  [phone] Fax  [phone]

## 2018-11-22 NOTE — Patient Instructions (Addendum)
   If you have lab work done today you will be contacted with your lab results within the next 2 weeks.  If you have not heard from us then please contact us. The fastest way to get your results is to register for My Chart.   IF you received an x-ray today, you will receive an invoice from [facility]. Please contact [facility] at [phone] with questions or concerns regarding your invoice.   IF you received labwork today, you will receive an invoice from [facility]. Please contact [facility] at [phone] with questions or concerns regarding your invoice.   Our billing staff will not be able to assist you with questions regarding bills from these companies.  You will be contacted with the lab results as soon as they are available. The fastest way to get your results is to activate your My Chart account. Instructions are located on the last page of this paperwork. If you have not heard from us regarding the results in 2 weeks, please contact this office.     Influenza, Adult Influenza is also called "the flu." It is an infection in the lungs, nose, and throat (respiratory tract). It is caused by a virus. The flu causes symptoms that are similar to symptoms of a cold. It also causes a high fever and body aches. The flu spreads easily from person to person (is contagious). Getting a flu shot (influenza vaccination) every year is the best way to prevent the flu. What are the causes? This condition is caused by the influenza virus. You can get the virus by:  Breathing in droplets that are in the air from the cough or sneeze of a person who has the virus.  Touching something that has the virus on it (is contaminated) and then touching your mouth, nose, or eyes. What increases the risk? Certain things may make you more likely to get the flu. These include:  Not washing your hands often.  Having close contact with many people during cold and flu season.  Touching your  mouth, eyes, or nose without first washing your hands.  Not getting a flu shot every year. You may have a higher risk for the flu, along with serious problems such as a lung infection (pneumonia), if you:  Are older than 65.  Are pregnant.  Have a weakened disease-fighting system (immune system) because of a disease or taking certain medicines.  Have a long-term (chronic) illness, such as: ? Heart, kidney, or lung disease. ? Diabetes. ? Asthma.  Have a liver disorder.  Are very overweight (morbidly obese).  Have anemia. This is a condition that affects your red blood cells. What are the signs or symptoms? Symptoms usually begin suddenly and last 4-14 days. They may include:  Fever and chills.  Headaches, body aches, or muscle aches.  Sore throat.  Cough.  Runny or stuffy (congested) nose.  Chest discomfort.  Not wanting to eat as much as normal (poor appetite).  Weakness or feeling tired (fatigue).  Dizziness.  Feeling sick to your stomach (nauseous) or throwing up (vomiting). How is this treated? If the flu is found early, you can be treated with medicine that can help reduce how bad the illness is and how long it lasts (antiviral medicine). This may be given by mouth (orally) or through an IV tube. Taking care of yourself at home can help your symptoms get better. Your doctor may suggest:  Taking over-the-counter medicines.  Drinking plenty of fluids. The flu often   goes away on its own. If you have very bad symptoms or other problems, you may be treated in a hospital. Follow these instructions at home:     Activity  Rest as needed. Get plenty of sleep.  Stay home from work or school as told by your doctor. ? Do not leave home until you do not have a fever for 24 hours without taking medicine. ? Leave home only to visit your doctor. Eating and drinking  Take an ORS (oral rehydration solution). This is a drink that is sold at pharmacies and  stores.  Drink enough fluid to keep your pee (urine) pale yellow.  Drink clear fluids in small amounts as you are able. Clear fluids include: ? Water. ? Ice chips. ? Fruit juice that has water added (diluted fruit juice). ? Low-calorie sports drinks.  Eat bland, easy-to-digest foods in small amounts as you are able. These foods include: ? Bananas. ? Applesauce. ? Rice. ? Lean meats. ? Toast. ? Crackers.  Do not eat or drink: ? Fluids that have a lot of sugar or caffeine. ? Alcohol. ? Spicy or fatty foods. General instructions  Take over-the-counter and prescription medicines only as told by your doctor.  Use a cool mist humidifier to add moisture to the air in your home. This can make it easier for you to breathe.  Cover your mouth and nose when you cough or sneeze.  Wash your hands with soap and water often, especially after you cough or sneeze. If you cannot use soap and water, use alcohol-based hand sanitizer.  Keep all follow-up visits as told by your doctor. This is important. How is this prevented?   Get a flu shot every year. You may get the flu shot in late summer, fall, or winter. Ask your doctor when you should get your flu shot.  Avoid contact with people who are sick during fall and winter (cold and flu season). Contact a doctor if:  You get new symptoms.  You have: ? Chest pain. ? Watery poop (diarrhea). ? A fever.  Your cough gets worse.  You start to have more mucus.  You feel sick to your stomach.  You throw up. Get help right away if you:  Have shortness of breath.  Have trouble breathing.  Have skin or nails that turn a bluish color.  Have very bad pain or stiffness in your neck.  Get a sudden headache.  Get sudden pain in your face or ear.  Cannot eat or drink without throwing up. Summary  Influenza ("the flu") is an infection in the lungs, nose, and throat. It is caused by a virus.  Take over-the-counter and prescription  medicines only as told by your doctor.  Getting a flu shot every year is the best way to avoid getting the flu. This information is not intended to replace advice given to you by your health care provider. Make sure you discuss any questions you have with your health care provider. Document Released: 07/21/2008 Document Revised: 03/30/2018 Document Reviewed: 03/30/2018 Elsevier Interactive Patient Education  2019 [employer]vier Inc.  

## 2018-11-25 ENCOUNTER — Ambulatory Visit: Payer: Self-pay

## 2018-11-25 NOTE — Telephone Encounter (Signed)
Outgoing call to Patient who complains of Flu Sx not improving.  Reports having facial pain.  Nose burning gums itching  Headache, productive cough with greenish yellow phlegm. Denies  Fever.  Patient states that she has sneezed 10 times in a row.  Drinking water,  Orange juice, apple juice.  Ate first solid meal yesterday.  Patient uses [facility] on [address].  Patient Awaits to hear from Provider.    Reason for Disposition . Influenza and antiviral drugs, questions about  Answer Assessment - Initial Assessment Questions 1. DIAGNOSIS CONFIRMATION: "When was the influenza diagnosed?" "By whom?" "Did you get a test for it?"     Tuesday  Saturday went to Saturday 2. MEDICINES: "Were you prescribed any medications for the influenza?"  (e.g., zanamivir [Relenza], oseltamavir [Tamiflu]).       Cough medicine 3. ONSET of SYMPTOMS: "When did your symptoms start?"     *No Answer* 4. SYMPTOMS: "What symptoms are you most concerned about?" (e.g., runny nose, stuffy nose, sore throat, cough, breathing difficulty, fever)     Nose burning headache gums itching 5. COUGH: "How bad is the cough?"     Afraid to take cough  6. FEVER: "Do you have a fever?" If so, ask: "What is your temperature, how was it measured, and when did it start?"      7. RESPIRATORY DISTRESS: "Are you having any trouble breathing?" If yes, ask: "Describe your breathing."      denies 8. FLU VACCINE: "Did you receive a flu shot this year?" (e.g., seasonal influenza, H1N1)     yes 9. PREGNANCY: "Is there any chance you are pregnant?" "When was your last menstrual period?"     denies 10. HIGH RISK for COMPLICATIONS: "Do you have any heart or lung problems? Do you have a weakened immune system?" (e.g., CHF, COPD, asthma, HIV positive, chemotherapy, renal failure, diabetes mellitus, sickle cell anemia)       Denies  Protocols used: INFLUENZA FOLLOW-UP CALL-A-AH

## 2018-11-25 NOTE — Telephone Encounter (Signed)
Called and spoke with pt and informed her that the Flu normally takes about 1 week to 2 weeks before pt states to feel better completely, she verbalized understanding.

## 2018-11-26 ENCOUNTER — Other Ambulatory Visit: Payer: Self-pay | Admitting: Family Medicine

## 2018-11-28 NOTE — Telephone Encounter (Signed)
Requested medication (s) are due for refill today: yes  Requested medication (s) are on the active medication list: yes  Last refill:  06/06/18  Future visit scheduled: no  Notes to clinic:  Calcium channel blockers failed. No valid encounter. Office visit needed for HTN Courtesy refill given until appointment scheduled.  Requested Prescriptions  Pending Prescriptions Disp Refills   amLODipine (NORVASC) 2.5 MG tablet [Pharmacy Med Name: AMLODIPINE BESYLATE 2.5 MG TAB] 30 tablet 0    Sig: TAKE 1 TABLET BY MOUTH EVERY DAY     Cardiovascular:  Calcium Channel Blockers Failed - 11/26/2018 12:56 AM      Failed - Valid encounter within last 6 months    Recent Outpatient Visits          6 days ago Influenza A   [facility], Lilia Argue, MD   5 months ago Essential hypertension   [facility], Lilia Argue, MD   6 months ago Pes anserine bursitis   [facility], [place] D, PA-C   10 months ago Abnormal bruising   [facility], Lilia Argue, MD   12 months ago Upper back pain   [facility], Lilia Argue, MD             Passed - Last BP in normal range    BP Readings from Last 1 Encounters:  11/22/18 135/84       Signed Prescriptions Disp Refills   DEXILANT 60 MG capsule 90 capsule 1    Sig: TAKE 1 CAPSULE BY MOUTH EVERY DAY     Gastroenterology: Proton Pump Inhibitors Passed - 11/26/2018 12:56 AM      Passed - Valid encounter within last 12 months    Recent Outpatient Visits          6 days ago Influenza A   [facility], Lilia Argue, MD   5 months ago Essential hypertension   [facility], Lilia Argue, MD   6 months ago Pes anserine bursitis   [facility], [place] D, PA-C   10 months ago Abnormal bruising   [facility], Lilia Argue, MD   12 months ago Upper back pain   [facility], Lilia Argue, MD

## 2018-12-04 ENCOUNTER — Other Ambulatory Visit: Payer: Self-pay | Admitting: Oncology

## 2018-12-08 ENCOUNTER — Other Ambulatory Visit: Payer: Self-pay

## 2018-12-08 ENCOUNTER — Ambulatory Visit: Payer: 59 | Admitting: Family Medicine

## 2018-12-08 ENCOUNTER — Encounter: Payer: Self-pay | Admitting: Family Medicine

## 2018-12-08 VITALS — BP 134/77 | HR 87 | Temp 98.4°F | Ht 62.0 in | Wt 185.4 lb

## 2018-12-08 DIAGNOSIS — R05 Cough: Secondary | ICD-10-CM

## 2018-12-08 DIAGNOSIS — J101 Influenza due to other identified influenza virus with other respiratory manifestations: Secondary | ICD-10-CM | POA: Diagnosis not present

## 2018-12-08 DIAGNOSIS — R059 Cough, unspecified: Secondary | ICD-10-CM | POA: Insufficient documentation

## 2018-12-08 DIAGNOSIS — Z09 Encounter for follow-up examination after completed treatment for conditions other than malignant neoplasm: Secondary | ICD-10-CM | POA: Diagnosis not present

## 2018-12-08 NOTE — Progress Notes (Addendum)
Established Patient Office Visit  Subjective:  Patient ID: April Robinson, female    DOB: 08-26-1961  Age: 58 y.o. MRN: [id number]  CC:  Chief Complaint  Patient presents with  . Cough  . Nasal Congestion    fighting the flu on 11/18/18   . ears poping    HPI April Robinson is a 58 year old female who presents for follow up.   Past Medical History:  Diagnosis Date  . Alcoholism ([place])   . Anxiety   . Arthritis   . Cancer ([place])    breast L  . Esophageal reflux   . Hiatal hernia   . Hx of radiation therapy 08/27/11 to 10/15/11   L breast  . Hyperlipemia   . Hypertension    past not on any medications currently  . Personal history of colonic polyps    TUBULAR ADENOMA  . Personal history of radiation therapy    Current Status: Since her last office visit, she is recuperating from Influenza diagnosis on 11/18/2018. She states that she has continues to experience continued coughing, congestion, and shortness of breath. She has not been taking Guaifenesin-Codeine as prescribed because it makes her drowsiness at work, so she only takes 5 ml at night. Her anxiety is mild. She denies suicidal ideations, homicidal ideations, or auditory hallucinations.   She denies fevers, chills, fatigue, recent infections, weight loss, and night sweats. She has not had any headaches, visual changes, dizziness, and falls. No chest pain, heart palpitations reported. No reports of GI problems such as nausea, vomiting, diarrhea, and constipation. She has no reports of blood in stools, dysuria and hematuria.   Past Surgical History:  Procedure Laterality Date  . ABDOMINAL HYSTERECTOMY     partial for DUB  . BREAST LUMPECTOMY  07/23/11   left breast  . COLONOSCOPY    . LAPAROSCOPIC HYSTERECTOMY  1995    Family History  Problem Relation Age of Onset  . Breast cancer Mother   . Cancer Mother        breast  . Hyperlipidemia Mother   . Diabetes Father   . Hypertension Father   . Cancer Father     . Hyperlipidemia Father   . Cancer Brother        NEUROENDOCRINE  . Hypertension Brother   . Arthritis Brother        hips  . Colon cancer Neg Hx     Social History   Socioeconomic History  . Marital status: Married    Spouse name: Nathaneil Canary  . Number of children: 3  . Years of education: 12+  . Highest education level: Not on file  Occupational History  . Occupation: Research scientist (physical sciences): [employer]    Employer: [employer]  Social Needs  . Financial resource strain: Not on file  . Food insecurity:    Worry: Not on file    Inability: Not on file  . Transportation needs:    Medical: Not on file    Non-medical: Not on file  Tobacco Use  . Smoking status: Former Smoker    Types: Cigarettes    Last attempt to quit: 1983    Years since quitting: 37.1  . Smokeless tobacco: Never Used  Substance and Sexual Activity  . Alcohol use: Yes    Alcohol/week: 0.0 standard drinks    Comment: rare  . Drug use: No  . Sexual activity: Yes    Partners: Male    Birth control/protection:  Surgical    Comment: [name]  , SEXUAL PARTNERS MORE THAN 5  Lifestyle  . Physical activity:    Days per week: Not on file    Minutes per session: Not on file  . Stress: Not on file  Relationships  . Social connections:    Talks on phone: Not on file    Gets together: Not on file    Attends religious service: Not on file    Active member of club or organization: Not on file    Attends meetings of clubs or organizations: Not on file    Relationship status: Not on file  . Intimate partner violence:    Fear of current or ex partner: Not on file    Emotionally abused: Not on file    Physically abused: Not on file    Forced sexual activity: Not on file  Other Topics Concern  . Not on file  Social History Narrative   Lives with her husband. Their children (her 3 and his one) live locally.    Outpatient Medications Prior to Visit  Medication Sig Dispense Refill   . albuterol (PROVENTIL HFA;VENTOLIN HFA) 108 (90 Base) MCG/ACT inhaler Inhale 2 puffs into the lungs every 6 (six) hours as needed for wheezing or shortness of breath. 1 Inhaler 0  . amLODipine (NORVASC) 2.5 MG tablet TAKE 1 TABLET BY MOUTH EVERY DAY 30 tablet 0  . DEXILANT 60 MG capsule TAKE 1 CAPSULE BY MOUTH EVERY DAY 90 capsule 1  . guaiFENesin-codeine 100-10 MG/5ML syrup Take 5 mLs by mouth every 6 (six) hours as needed for cough. 120 mL 0  . ibuprofen (ADVIL,MOTRIN) 800 MG tablet Take 1 tablet (800 mg total) by mouth 3 (three) times daily as needed. 30 tablet 0  . tamoxifen (NOLVADEX) 20 MG tablet TAKE 1 TABLET BY MOUTH EVERY DAY 90 tablet 1  . triamcinolone (KENALOG) 0.025 % ointment APPLY TOPICALLY 2 TIMES DAILY AS NEEDED FOR ITCHING 30 g 5  . valACYclovir (VALTREX) 500 MG tablet TAKE ONE TAB TWICE DAILY FOR 3-5 DAYS WITH OUTBREAK. 10 tablet 2  . guaiFENesin (MUCINEX) 600 MG 12 hr tablet Take 1 tablet (600 mg total) by mouth 2 (two) times daily. 15 tablet 0  . benzonatate (TESSALON) 100 MG capsule Take 1 capsule (100 mg total) by mouth every 8 (eight) hours. 21 capsule 0   Facility-Administered Medications Prior to Visit  Medication Dose Route Frequency Provider Last Rate Last Dose  . 0.9 %  sodium chloride infusion  500 mL Intravenous Continuous Pyrtle, Lajuan Lines, MD        Allergies  Allergen Reactions  . Meloxicam Shortness Of Breath and Nausea Only  . Miconazole Nitrate Other (See Comments)    SWELLING OF SOFT PALATE THAT CAUSED DIFFICULTY SWALLOWING  . Latex Itching, Rash and Other (See Comments)    Fine, little bumps    ROS Review of Systems  Constitutional: Negative.   HENT: Negative.   Eyes: Negative.   Respiratory: Positive for cough (increased) and shortness of breath.   Cardiovascular: Negative.   Gastrointestinal: Negative.   Endocrine: Negative.   Genitourinary: Negative.   Musculoskeletal: Negative.   Skin: Negative.   Allergic/Immunologic: Negative.    Neurological: Negative.   Hematological: Negative.    Objective:    Physical Exam  Constitutional: She is oriented to person, place, and time. She appears well-developed and well-nourished.  HENT:  Head: Normocephalic and atraumatic.  Eyes: Conjunctivae are normal.  Neck: Normal range of motion.  Neck supple.  Cardiovascular: Normal rate, regular rhythm, normal heart sounds and intact distal pulses.  Pulmonary/Chest: Effort normal and breath sounds normal.  Abdominal: Soft. Bowel sounds are normal.  Musculoskeletal: Normal range of motion.  Neurological: She is alert and oriented to person, place, and time. She has normal reflexes.  Skin: Skin is warm and dry.  Psychiatric: She has a normal mood and affect. Her behavior is normal. Judgment and thought content normal.  Nursing note and vitals reviewed.   BP 134/77 (BP Location: Right Arm, Patient Position: Sitting, Cuff Size: Normal)   Pulse 87   Temp 98.4 F (36.9 C) (Oral)   Ht '5\' 2"'$  (1.575 m)   Wt 185 lb 6.4 oz (84.1 kg)   SpO2 96%   BMI 33.91 kg/m  Wt Readings from Last 3 Encounters:  12/08/18 185 lb 6.4 oz (84.1 kg)  11/22/18 178 lb (80.7 kg)  11/19/18 181 lb (82.1 kg)     Health Maintenance Due  Topic Date Due  . Hepatitis C Screening  [birth year]  . HIV Screening  11/03/1974  . PAP SMEAR-Modifier  12/23/2017    There are no preventive care reminders to display for this patient.  Lab Results  Component Value Date   TSH 1.210 06/06/2018   Lab Results  Component Value Date   WBC 6.2 06/06/2018   HGB 13.8 06/06/2018   HCT 41.8 06/06/2018   MCV 87 06/06/2018   PLT 269 06/06/2018   Lab Results  Component Value Date   NA 144 06/06/2018   K 4.4 06/06/2018   CHLORIDE 108 09/03/2016   CO2 22 06/06/2018   GLUCOSE 94 06/06/2018   BUN 17 06/06/2018   CREATININE 1.18 (H) 06/06/2018   BILITOT <0.2 06/06/2018   ALKPHOS 64 06/06/2018   AST 16 06/06/2018   ALT 17 06/06/2018   PROT 6.8 06/06/2018   ALBUMIN  4.4 06/06/2018   CALCIUM 9.3 06/06/2018   ANIONGAP 9 02/27/2017   EGFR 68 (L) 09/03/2016   GFR 70.38 04/24/2016   Lab Results  Component Value Date   CHOL 196 06/06/2018   Lab Results  Component Value Date   HDL 49 06/06/2018   Lab Results  Component Value Date   LDLCALC 105 (H) 06/06/2018   Lab Results  Component Value Date   TRIG 208 (H) 06/06/2018   Lab Results  Component Value Date   CHOLHDL 4.0 06/06/2018   Lab Results  Component Value Date   HGBA1C 5.5 04/24/2016   Assessment & Plan:   1. Cough She will begin taking Guaifenesin/Codeine every 6 hours as needed. She will contact office if symptoms do not improve or worsen.   2. Influenza A She was diagnosed on 11/18/2018. She continues to have increased cough. All other symptoms have resolved.   3. Follow up She will follow up as needed.  No orders of the defined types were placed in this encounter.   Kathe Becton,  MSN, FNP-C [facility] at [facility] [place], [place] [address] [phone]  Problem List Items Addressed This Visit    None    Visit Diagnoses    Cough    -  Primary   Influenza A       Follow up          No orders of the defined types were placed in this encounter.   Follow-up: No follow-ups on file.    Azzie Glatter, FNP

## 2018-12-08 NOTE — Patient Instructions (Addendum)
If you have lab work done today you will be contacted with your lab results within the next 2 weeks.  If you have not heard from [place] then please contact us. The fastest way to get your results is to register for My Chart.   IF you received an x-ray today, you will receive an invoice from [facility]. Please contact [facility] at [phone] with questions or concerns regarding your invoice.   IF you received labwork today, you will receive an invoice from [facility]. Please contact [facility] at [phone] with questions or concerns regarding your invoice.   Our billing staff will not be able to assist you with questions regarding bills from these companies.  You will be contacted with the lab results as soon as they are available. The fastest way to get your results is to activate your My Chart account. Instructions are located on the last page of this paperwork. If you have not heard from [place] regarding the results in 2 weeks, please contact this office.     Codeine; Guaifenesin oral solution or syrup What is this medicine? CODEINE; GUAIFENESIN (KOE deen; gwye FEN e sin) is a cough suppressant and expectorant. It helps to stop or reduce coughing due to the common cold or inhaled irritants. It will not treat an infection. This medicine may be used for other purposes; ask your health care provider or pharmacist if you have questions. COMMON BRAND NAME(S): Antituss AC, Brontex, Cheracol with Codeine, Cheratussin AC, Codefen, Dex-Tuss, Diabetic Tussin C, Duraganidin NR, ExeClear-C, Gani-Tuss NR, Guai Co, Guaiatussin AC, Guaifen C, Guiatuss AC, Halotussin AC, Iophen C-NR, M-Clear WC, Mar-Cof CG, Mytussin AC, Ninjacof-XG, RelCof C, Robafen AC, Romilar AC, Tussi-Organidin NR, Tussiden C, Tusso-C, Virtussin AC What should I tell my health care provider before I take this medicine? They need to know if you have any of these conditions: -Addison's disease -brain  tumor -gallbladder disease -head injury -heart disease -history of a drug or alcohol abuse problem -history of irregular heartbeat -if you often drink alcohol -kidney disease -liver disease -low blood pressure -lung or breathing disease, like asthma -mental illness -pancreatic disease -seizures -stomach or intestine problems -thyroid disease -trouble passing urine -an allergic or unusual reaction to guaifenesin, codeine, other medicines, foods, dyes, or preservatives -pregnant or trying to get pregnant -breast-feeding How should I use this medicine? Take this medicine by mouth. Follow the directions on the prescription label. You can take it with or without food. If it upsets your stomach, take it with food. Use a specially marked spoon or container to measure each dose. Ask your pharmacist if you do not have one. Household spoons are not accurate. Do not to overfill. Rinse the measuring device with water after each use. Take your medicine at regular intervals. Do not take it more often than directed. A special MedGuide will be given to you by the pharmacist with each prescription and refill. Be sure to read this information carefully each time. Talk to your pediatrician regarding the use of this medicine in children. This medicine is not approved for use in children. Overdosage: If you think you have taken too much of this medicine contact a poison control center or emergency room at once. NOTE: This medicine is only for you. Do not share this medicine with others. What if I miss a dose? If you miss a dose, take it as soon as you can. If it is almost time for your next dose, take only that dose. Do  not take double or extra doses. What may interact with this medicine? Do not take this medication with any of the following medicines: -alcohol -antihistamines for allergy, cough and cold -certain medicines for anxiety or sleep -certain medicines for depression like amitriptyline,  fluoxetine, sertraline -certain medicines for seizures like carbamazepine, phenobarbital, phenytoin, primidone -general anesthetics like halothane, isoflurane, methoxyflurane, propofol -local anesthetics like lidocaine, pramoxine, tetracaine -MAOIs like Carbex, Eldepryl, Marplan, Nardil, and Parnate -medicines that relax muscles for surgery -other narcotic medicines (opiates) for pain or cough -phenothiazines like chlorpromazine, mesoridazine, prochlorperazine, thioridazine This medicine may also interact with the following medications: -antiviral medicines for HIV or AIDS -atropine -certain antibiotics like erythromycin and clarithromycin -certain medicines for bladder problems like oxybutynin, tolterodine -certain medicines for fungal infections like ketoconazole and itraconazole -certain medicines for irregular heart beat like amiodarone, propafenone, quinidine -certain medicines for Parkinson's disease like benztropine, trihexyphenidyl -certain medicines for stomach problems like dicyclomine, hyoscyamine -certain medicines for travel sickness like scopolamine -ipratropium -rifampin This list may not describe all possible interactions. Give your health care provider a list of all the medicines, herbs, non-prescription drugs, or dietary supplements you use. Also tell them if you smoke, drink alcohol, or use illegal drugs. Some items may interact with your medicine. What should I watch for while using this medicine? Use exactly as directed by your doctor or health care professional. Do not take more than the recommended dose. You may develop tolerance to this medicine if you take it for a long time. Tolerance means that you will get less cough relief with time. Tell your doctor or health care professional if your symptoms do not improve or if they get worse. If you have been taking this medicine for a long time, do not suddenly stop taking it because you may develop a severe reaction. Your  body becomes used to the medicine. This does NOT mean you are addicted. Addiction is a behavior related to getting and using a drug for a nonmedical reason. If your doctor wants you to stop the medicine, the dose will be slowly lowered over time to avoid any side effects. There are different types of narcotic medicines (opiates). If you take more than one type at the same time or if you are taking another medicine that also causes drowsiness, you may have more side effects. Give your health care provider a list of all medicines you use. Your doctor will tell you how much medicine to take. Do not take more medicine than directed. Call emergency for help if you have problems breathing or unusual sleepiness. You may get drowsy or dizzy. Do not drive, use machinery, or do anything that needs mental alertness until you know how this medicine affects you. Do not stand or sit up quickly, especially if you are an older patient. This reduces the risk of dizzy or fainting spells. Alcohol may interfere with the effect of this medicine. Avoid alcoholic drinks. The medicine may cause constipation. Try to have a bowel movement at least every 2 to 3 days. If you do not have a bowel movement for 3 days, call your doctor or health care professional. Your mouth may get dry. Chewing sugarless gum or sucking hard candy, and drinking plenty of water may help. Contact your doctor if the problem does not go away or is severe. What side effects may I notice from receiving this medicine? Side effects that you should report to your doctor or health care professional as soon as possible: -allergic reactions like skin  rash, itching or hives, swelling of the face, lips, or tongue -breathing problems -confusion -seizures -signs and symptoms of low blood pressure like dizziness; feeling faint or lightheaded, falls; unusually weak or tired -trouble passing urine or change in the amount of urine Side effects that usually do not require  medical attention (report to your doctor or health care professional if they continue or are bothersome): -constipation -dry mouth -nausea, vomiting -tiredness This list may not describe all possible side effects. Call your doctor for medical advice about side effects. You may report side effects to FDA at 1-800-FDA-1088. Where should I keep my medicine? Keep out of the reach of children. This medicine can be abused. Keep your medicine in a safe place to protect it from theft. Do not share this medicine with anyone. Selling or giving away this medicine is dangerous and against the law. Store at room temperature between 15 and 30 degrees C (59 and 86 degrees F). Protect from light. This medicine may cause accidental overdose and death if taken by other adults, children, or pets. Mix any unused medicine with a substance like cat littler or coffee grounds. Then throw the medicine away in a sealed container like a sealed bag or a coffee can with a lid. Do not use the medicine after the expiration date. NOTE: This sheet is a summary. It may not cover all possible information. If you have questions about this medicine, talk to your doctor, pharmacist, or health care provider.  2019 Elsevier/Gold Standard (2017-06-30 14:17:13)  Cough, Adult  A cough helps to clear your throat and lungs. A cough may last only 2-3 weeks (acute), or it may last longer than 8 weeks (chronic). Many different things can cause a cough. A cough may be a sign of an illness or another medical condition. Follow these instructions at home:  Pay attention to any changes in your cough.  Take medicines only as told by your doctor. ? If you were prescribed an antibiotic medicine, take it as told by your doctor. Do not stop taking it even if you start to feel better. ? Talk with your doctor before you try using a cough medicine.  Drink enough fluid to keep your pee (urine) clear or pale yellow.  If the air is dry, use a cold steam  vaporizer or humidifier in your home.  Stay away from things that make you cough at work or at home.  If your cough is worse at night, try using extra pillows to raise your head up higher while you sleep.  Do not smoke, and try not to be around smoke. If you need help quitting, ask your doctor.  Do not have caffeine.  Do not drink alcohol.  Rest as needed. Contact a doctor if:  You have new problems (symptoms).  You cough up yellow fluid (pus).  Your cough does not get better after 2-3 weeks, or your cough gets worse.  Medicine does not help your cough and you are not sleeping well.  You have pain that gets worse or pain that is not helped with medicine.  You have a fever.  You are losing weight and you do not know why.  You have night sweats. Get help right away if:  You cough up blood.  You have trouble breathing.  Your heartbeat is very fast. This information is not intended to replace advice given to you by your health care provider. Make sure you discuss any questions you have with your health care  provider. Document Released: 06/25/2011 Document Revised: 03/19/2016 Document Reviewed: 12/19/2014 Elsevier Interactive Patient Education  2019 [employer].

## 2018-12-09 ENCOUNTER — Ambulatory Visit: Payer: 59 | Admitting: Family Medicine

## 2018-12-26 ENCOUNTER — Other Ambulatory Visit: Payer: Self-pay | Admitting: Family Medicine

## 2018-12-26 NOTE — Telephone Encounter (Signed)
Requested medication (s) are due for refill today: Yes  Requested medication (s) are on the active medication list: Yes  Last refill:  11/28/18  Future visit scheduled: No  Notes to clinic:  Unable to refill per protocol, appointment needed     Requested Prescriptions  Pending Prescriptions Disp Refills   amLODipine (NORVASC) 2.5 MG tablet [Pharmacy Med Name: AMLODIPINE BESYLATE 2.5 MG TAB] 30 tablet 0    Sig: TAKE 1 TABLET BY MOUTH EVERY DAY     Cardiovascular:  Calcium Channel Blockers Failed - 12/26/2018  2:51 PM      Failed - Valid encounter within last 6 months    Recent Outpatient Visits          2 weeks ago Cough   [facility] at [facility], Ellie Lunch, FNP   1 month ago Influenza A   [facility], Lilia Argue, MD   6 months ago Essential hypertension   [facility], Lilia Argue, MD   7 months ago Pes anserine bursitis   [facility] at [facility], [place] D, PA-C   11 months ago Abnormal bruising   [facility], Lilia Argue, MD             Passed - Last BP in normal range    BP Readings from Last 1 Encounters:  12/08/18 134/77

## 2018-12-26 NOTE — Telephone Encounter (Signed)
Patient called, left VM on cell phone to call the office back to schedule an OV in order to receive refills.

## 2018-12-27 ENCOUNTER — Other Ambulatory Visit: Payer: Self-pay | Admitting: Oncology

## 2018-12-27 DIAGNOSIS — Z1231 Encounter for screening mammogram for malignant neoplasm of breast: Secondary | ICD-10-CM

## 2018-12-29 ENCOUNTER — Ambulatory Visit (INDEPENDENT_AMBULATORY_CARE_PROVIDER_SITE_OTHER): Payer: 59

## 2018-12-29 ENCOUNTER — Ambulatory Visit: Payer: 59 | Admitting: Family Medicine

## 2018-12-29 ENCOUNTER — Other Ambulatory Visit: Payer: Self-pay

## 2018-12-29 ENCOUNTER — Telehealth: Payer: Self-pay

## 2018-12-29 ENCOUNTER — Encounter: Payer: Self-pay | Admitting: Family Medicine

## 2018-12-29 VITALS — BP 123/83 | HR 98 | Temp 98.8°F | Resp 18 | Ht 62.0 in | Wt 181.8 lb

## 2018-12-29 DIAGNOSIS — R05 Cough: Secondary | ICD-10-CM | POA: Diagnosis not present

## 2018-12-29 DIAGNOSIS — R0789 Other chest pain: Secondary | ICD-10-CM

## 2018-12-29 DIAGNOSIS — R059 Cough, unspecified: Secondary | ICD-10-CM

## 2018-12-29 DIAGNOSIS — R058 Other specified cough: Secondary | ICD-10-CM

## 2018-12-29 MED ORDER — BENZONATATE 100 MG PO CAPS: 100.0000 mg | ORAL_CAPSULE | Freq: Three times a day (TID) | ORAL | 0 refills | Status: DC | PRN

## 2018-12-29 MED ORDER — PREDNISONE 20 MG PO TABS: ORAL_TABLET | ORAL | 0 refills | Status: DC

## 2018-12-29 NOTE — Progress Notes (Signed)
Patient ID: April Robinson, female    DOB: 05-31-1961  Age: 58 y.o. MRN: [id number]  Chief Complaint  Patient presents with  . Cough    sinus drainage, sneezing, had the flu in Jan and having rib pain due to coughing     Subjective:   58 year old lady whose had a cough since January.  She was seen initially at an urgent care elsewhere mid January and told she had a viral infection.  A few days later she came in here and was diagnosed with having had the flu.  She continued with a lot of cough and came back in in mid February and was treated additionally.  She has persisted with cough.  She does have a tickle in the back of her throat and a post nasal drainage sensation.  She does not smoke.  She has not had head congestion.  Not blowing anything out of her nose.  No ear pain.  No fever.  No shortness of breath.  No definite wheezing though she occasionally uses her albuterol inhaler with variable relief.  She has continued to work despite feeling bad.  She lives with her husband, child, and a couple of grandkids.  No one else is been sick.  Constitutional: Unremarkable Respiratory: As above Cardiovascular: Markable GI: Unremarkable HEENT: As above Lymphatic: Unremarkable GI: Unremarkable Musculoskeletal: Left posterior chest wall pain.  Current allergies, medications, problem list, past/family and social histories reviewed.  Objective:  BP 123/83   Pulse 98   Temp 98.8 F (37.1 C) (Oral)   Resp 18   Ht 5\' 2"  (1.575 m)   Wt 181 lb 12.8 oz (82.5 kg)   SpO2 97%   BMI 33.25 kg/m   Was not lady, alert and oriented.  TMs normal.  Throat clear.  Sinuses nontender.  Neck supple without significant nodes.  Chest is clear to auscultation.  No rales, rhonchi, or wheezes.  Chest wall nontender.  Heart regular without any murmurs.  Assessment & Plan:   Assessment: 1. Post-viral cough syndrome   2. Cough   3. Chest wall pain       Plan: Because this is hung on so long we will check a  chest x-ray.  I think she just has a post viral cough syndrome.  Orders Placed This Encounter  Procedures  . DG Chest 2 View    Standing Status:   Future    Number of Occurrences:   1    Standing Expiration Date:   12/29/2019    Order Specific Question:   Reason for Exam (SYMPTOM  OR DIAGNOSIS REQUIRED)    Answer:   cough 6 weeks; left lower chest posteriorly discomfort    Order Specific Question:   Is the patient pregnant?    Answer:   No    Order Specific Question:   Preferred imaging location?    Answer:   External    Meds ordered this encounter  Medications  . benzonatate (TESSALON) 100 MG capsule    Sig: Take 1-2 capsules (100-200 mg total) by mouth 3 (three) times daily as needed.    Dispense:  30 capsule    Refill:  0  . predniSONE (DELTASONE) 20 MG tablet    Sig: Take 3 each morning for 2 days, then 2 daily for 2 days, then 1 daily for 2 days.  Best taken after breakfast.    Dispense:  12 tablet    Refill:  0    X-ray appears normal to me.  Radiology reading is still pending.  Has small surgical clips which appear to be in the lateral left breast.     Patient Instructions    Drink plenty of fluids and get enough rest near  Use benzonatate cough pills 1 or 2 pills 3 times daily as needed for daytime cough  Can take a NyQuil or Robitussin-DM or Mucinex DM at nighttime if needed for cough  Use your albuterol inhaler if wheezing.  Take prednisone 20 mg 3 pills daily for 2 days, then 2 daily for 2 days, then 1 daily for 2 days.  Best taken after breakfast.  This is a strong anti-inflammatory medication to knock out the inflammation in the lungs and and sinuses.  I do not believe antibiotics are indicated at this time.  However if you start coughing up a lot of purulent looking yellow/green mucus please return.  Return as needed.  If you have lab work done today you will be contacted with your lab results within the next 2 weeks.  If you have not heard from [place] then  please contact us. The fastest way to get your results is to register for My Chart.   IF you received an x-ray today, you will receive an invoice from [facility]. Please contact [facility] at [phone] with questions or concerns regarding your invoice.   IF you received labwork today, you will receive an invoice from [place]. Please contact [facility] at [phone] with questions or concerns regarding your invoice.   Our billing staff will not be able to assist you with questions regarding bills from these companies.  You will be contacted with the lab results as soon as they are available. The fastest way to get your results is to activate your My Chart account. Instructions are located on the last page of this paperwork. If you have not heard from [place] regarding the results in 2 weeks, please contact this office.        No follow-ups on file.   Ruben Reason, MD 12/29/2018

## 2018-12-29 NOTE — Telephone Encounter (Signed)
Called pt and informed her that her x-rays looks good. She stated understanding.

## 2018-12-29 NOTE — Patient Instructions (Addendum)
  Drink plenty of fluids and get enough rest near  Use benzonatate cough pills 1 or 2 pills 3 times daily as needed for daytime cough  Can take a NyQuil or Robitussin-DM or Mucinex DM at nighttime if needed for cough  Use your albuterol inhaler if wheezing.  Take prednisone 20 mg 3 pills daily for 2 days, then 2 daily for 2 days, then 1 daily for 2 days.  Best taken after breakfast.  This is a strong anti-inflammatory medication to knock out the inflammation in the lungs and and sinuses.  I do not believe antibiotics are indicated at this time.  However if you start coughing up a lot of purulent looking yellow/green mucus please return.  Return as needed.  If you have lab work done today you will be contacted with your lab results within the next 2 weeks.  If you have not heard from [place] then please contact us. The fastest way to get your results is to register for My Chart.   IF you received an x-ray today, you will receive an invoice from [facility]. Please contact [facility] at [phone] with questions or concerns regarding your invoice.   IF you received labwork today, you will receive an invoice from [place]. Please contact [facility] at [phone] with questions or concerns regarding your invoice.   Our billing staff will not be able to assist you with questions regarding bills from these companies.  You will be contacted with the lab results as soon as they are available. The fastest way to get your results is to activate your My Chart account. Instructions are located on the last page of this paperwork. If you have not heard from [place] regarding the results in 2 weeks, please contact this office.

## 2019-01-25 ENCOUNTER — Telehealth: Payer: Self-pay | Admitting: Oncology

## 2019-01-25 NOTE — Telephone Encounter (Signed)
Called patient regarding rescheduling appointments, let a voicemail.

## 2019-02-02 ENCOUNTER — Ambulatory Visit: Payer: 59

## 2019-02-06 ENCOUNTER — Telehealth: Payer: Self-pay | Admitting: Oncology

## 2019-02-06 NOTE — Telephone Encounter (Signed)
Rescheduled 4/16 appt per sch msg. Called patient, no answer. Left message.

## 2019-02-09 ENCOUNTER — Other Ambulatory Visit: Payer: 59

## 2019-02-09 ENCOUNTER — Ambulatory Visit: Payer: 59 | Admitting: Oncology

## 2019-02-11 ENCOUNTER — Other Ambulatory Visit: Payer: Self-pay | Admitting: Family Medicine

## 2019-02-28 ENCOUNTER — Other Ambulatory Visit: Payer: Self-pay | Admitting: Family Medicine

## 2019-03-17 ENCOUNTER — Other Ambulatory Visit: Payer: Self-pay

## 2019-03-17 ENCOUNTER — Ambulatory Visit: Payer: 59

## 2019-03-17 ENCOUNTER — Encounter: Payer: Self-pay | Admitting: Family Medicine

## 2019-03-17 ENCOUNTER — Ambulatory Visit: Payer: 59 | Admitting: Family Medicine

## 2019-03-17 VITALS — BP 120/80 | HR 103 | Temp 98.1°F | Ht 62.0 in | Wt 184.0 lb

## 2019-03-17 DIAGNOSIS — I10 Essential (primary) hypertension: Secondary | ICD-10-CM

## 2019-03-17 DIAGNOSIS — M7052 Other bursitis of knee, left knee: Secondary | ICD-10-CM | POA: Diagnosis not present

## 2019-03-17 DIAGNOSIS — K219 Gastro-esophageal reflux disease without esophagitis: Secondary | ICD-10-CM

## 2019-03-17 MED ORDER — AMLODIPINE BESYLATE 2.5 MG PO TABS: 2.5000 mg | ORAL_TABLET | Freq: Every day | ORAL | 1 refills | Status: DC

## 2019-03-17 MED ORDER — IBUPROFEN 800 MG PO TABS: 800.0000 mg | ORAL_TABLET | Freq: Three times a day (TID) | ORAL | 2 refills | Status: DC | PRN

## 2019-03-17 MED ORDER — DEXLANSOPRAZOLE 60 MG PO CPDR: 60.0000 mg | DELAYED_RELEASE_CAPSULE | Freq: Every day | ORAL | 3 refills | Status: DC

## 2019-03-17 MED ORDER — VALACYCLOVIR HCL 500 MG PO TABS: ORAL_TABLET | ORAL | 2 refills | Status: DC

## 2019-03-17 NOTE — Progress Notes (Signed)
5/22/202012:04 PM  April Robinson 1961-09-15, 58 y.o., female [id number]  Chief Complaint  Patient presents with  . Hypertension    needing refills on the norvasc and dexilant  . Knee Pain    left knee, with some swelling, thinks it may be arthritis, does alot of dancing, very active. Dis take '800mg'$  ibroprofen today, knew feels much better. Has been using ice as well for the past 2 weeks  . pap    will be making appt with gyn for pap, having mammo done today    HPI:   Patient is a 58 y.o. 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 No locking or giving out but knee does get stiff after sitting for prolonged periods of time Still able to dance Has been taking ibu '800mg'$  prn Overall knee feels better   BP Readings from Last 3 Encounters:  03/17/19 120/80  12/29/18 123/83  12/08/18 134/77   Wt Readings from Last 3 Encounters:  03/17/19 184 lb (83.5 kg)  12/29/18 181 lb 12.8 oz (82.5 kg)  12/08/18 185 lb 6.4 oz (84.1 kg)   Xray left knee July 2019 - mild medial compartment narrowing suggestive of mild OA and/or meniscal damage, no signs of advanced DJD  Fall Risk  03/17/2019 12/29/2018 12/08/2018 11/22/2018 06/06/2018  Falls in the past year? 0 0 0 0 Yes  Number falls in past yr: 0 - 0 - 1  Injury with Fall? 0 - 1 0 No  Follow up - Falls evaluation completed Falls evaluation completed - -     Depression screen [facility] 2/9 03/17/2019 12/29/2018 12/08/2018  Decreased Interest 0 0 0  Down, Depressed, Hopeless 0 0 0  PHQ - 2 Score 0 0 0  Some  recent data might be hidden    Allergies  Allergen Reactions  . Meloxicam Shortness Of Breath and Nausea Only  . Miconazole Nitrate Other (See Comments)    SWELLING OF SOFT PALATE THAT CAUSED DIFFICULTY SWALLOWING  . Latex Itching, Rash and Other (See Comments)    Fine, little bumps    Prior to Admission medications   Medication Sig Start Date End Date Taking? Authorizing Provider  albuterol (PROVENTIL HFA;VENTOLIN HFA) 108 (90 Base) MCG/ACT inhaler Inhale 2 puffs into the lungs every 6 (six) hours as needed for wheezing or shortness of breath. 10/12/17  Yes Rutherford Guys, MD  amLODipine (NORVASC) 2.5 MG tablet TAKE 1 TABLET BY MOUTH EVERY DAY 03/01/19  Yes Rutherford Guys, MD  DEXILANT 60 MG capsule TAKE 1 CAPSULE BY MOUTH EVERY DAY 11/28/18  Yes Rutherford Guys, MD  ibuprofen (ADVIL,MOTRIN) 800 MG tablet Take 1 tablet (800 mg total) by mouth 3 (three) times daily as needed. 06/06/18  Yes Rutherford Guys, MD  tamoxifen (NOLVADEX) 20 MG tablet TAKE 1 TABLET BY MOUTH EVERY DAY 12/05/18  Yes Magrinat, Virgie Dad, MD  valACYclovir (VALTREX) 500 MG tablet TAKE ONE TAB TWICE DAILY FOR 3-5 DAYS WITH OUTBREAK. 06/06/18   Pamella Pert,  Lilia Argue, MD    Past Medical History:  Diagnosis Date  . Alcoholism ([place])   . Arthritis   . Cancer ([place])    breast L  . Esophageal reflux   . Hiatal hernia   . Hx of radiation therapy 08/27/11 to 10/15/11   L breast  . Hyperlipemia   . Hypertension    past not on any medications currently  . Personal history of colonic polyps    TUBULAR ADENOMA  . Personal history of radiation therapy     Past Surgical History:  Procedure Laterality Date  . ABDOMINAL HYSTERECTOMY     partial for DUB  . BREAST LUMPECTOMY  07/23/11   left breast  . COLONOSCOPY    . LAPAROSCOPIC HYSTERECTOMY  1995    Social History   Tobacco Use  . Smoking status: Former Smoker    Types: Cigarettes    Last attempt to quit: 1983    Years since quitting: 37.4  . Smokeless tobacco:  Never Used  Substance Use Topics  . Alcohol use: Yes    Alcohol/week: 0.0 standard drinks    Comment: rare    Family History  Problem Relation Age of Onset  . Breast cancer Mother   . Cancer Mother        breast  . Hyperlipidemia Mother   . Diabetes Father   . Hypertension Father   . Cancer Father   . Hyperlipidemia Father   . Cancer Brother        NEUROENDOCRINE  . Hypertension Brother   . Arthritis Brother        hips  . Colon cancer Neg Hx     ROS Per hpi  OBJECTIVE:  Today's Vitals   03/17/19 1149  BP: 120/80  Pulse: (!) 103  Temp: 98.1 F (36.7 C)  TempSrc: Oral  SpO2: 97%  Weight: 184 lb (83.5 kg)  Height: '5\' 2"'$  (1.575 m)   Body mass index is 33.65 kg/m.   Physical Exam Vitals signs and nursing note reviewed.  Constitutional:      Appearance: She is well-developed.  HENT:     Head: Normocephalic and atraumatic.  Eyes:     General: No scleral icterus.    Conjunctiva/sclera: Conjunctivae normal.     Pupils: Pupils are equal, round, and reactive to light.  Neck:     Musculoskeletal: Neck supple.  Pulmonary:     Effort: Pulmonary effort is normal.  Musculoskeletal:     Left knee: She exhibits swelling (pes anserine bursa). She exhibits normal range of motion, no erythema, no bony tenderness and normal meniscus. No medial joint line, no lateral joint line, no MCL, no LCL and no patellar tendon tenderness noted.  Skin:    General: Skin is warm and dry.  Neurological:     Mental Status: She is alert and oriented to person, place, and time.     ASSESSMENT and PLAN  1. Essential hypertension Controlled. Continue current regime.  - Lipid panel - TSH - CMP14+EGFR  2. Bursitis of left knee, unspecified bursa Discussed RICE therapy. Consider referral to sports medicine  3. Gastroesophageal reflux disease, esophagitis presence not specified Controlled. Continue current regime.   Other orders - amLODipine (NORVASC) 2.5 MG tablet; Take 1 tablet  (2.5 mg total) by mouth daily. - ibuprofen (ADVIL) 800 MG tablet; Take 1 tablet (800 mg total) by mouth 3 (three) times daily as needed. - valACYclovir (VALTREX) 500 MG tablet; TAKE ONE TAB TWICE DAILY FOR 3-5 DAYS WITH  OUTBREAK. - dexlansoprazole (DEXILANT) 60 MG capsule; Take 1 capsule (60 mg total) by mouth daily.  Return in about 6 months (around 09/17/2019).    Rutherford Guys, MD Primary Care at [place],  [address] Ph.  [phone] Fax [phone]

## 2019-03-17 NOTE — Patient Instructions (Signed)
Bursitis    Bursitis is when the fluid-filled sac (bursa) that covers and protects a joint is swollen (inflamed). Bursitis is most common near joints such as the knees, elbows, hips, and shoulders. It can cause pain and stiffness.  Follow these instructions at home:  Medicines   Take over-the-counter and prescription medicines only as told by your doctor.   If you were prescribed an antibiotic medicine, take it as told by your doctor. Do not stop taking it even if you start to feel better.  General instructions     Rest the affected area as told by your doctor.  ? If you can, raise (elevate) the affected area above the level of your heart while you are sitting or lying down.  ? Avoid doing things that make the pain worse.   Use a splint, brace, pad, or walking aid as told by your doctor.   If directed, put ice on the affected area:  ? If you have a removable splint or brace, take it off as told by your doctor.  ? Put ice in a plastic bag.  ? Place a towel between your skin and the bag, or between the splint or brace and the bag.  ? Leave the ice on for 20 minutes, 2-3 times a day.   Keep all follow-up visits as told by your doctor. This is important.  Preventing symptoms  Do these things to help you not have symptoms again:   Wear knee pads if you kneel often.   Wear sturdy running or walking shoes that fit you well.   Take a lot of breaks during activities that involve doing the same movements again and again.   Before you do any activity that takes a lot of effort, get your body ready by stretching.   Stay at a healthy weight or lose weight if your doctor says you should. If you need help doing this, ask your doctor.   Exercise often. If you start any new physical activity, do it slowly.  Contact a doctor if you:   Have a fever.   Have chills.   Have symptoms that do not get better with treatment or home care.  Summary   Bursitis is when the fluid-filled sac (bursa) that covers and protects a joint  is swollen.   Rest the affected area as told by your doctor.   Avoid doing things that make the pain worse.   Put ice on the affected area as told by your doctor.  This information is not intended to replace advice given to you by your health care provider. Make sure you discuss any questions you have with your health care provider.  Document Released: 04/01/2010 Document Revised: 08/27/2017 Document Reviewed: 08/27/2017  Elsevier Interactive Patient Education  2019 [employer]

## 2019-03-18 LAB — CMP14+EGFR
ALT: 16 IU/L (ref 0–32)
AST: 18 IU/L (ref 0–40)
Albumin/Globulin Ratio: 2 (ref 1.2–2.2)
Albumin: 4.4 g/dL (ref 3.8–4.9)
Alkaline Phosphatase: 60 IU/L (ref 39–117)
BUN/Creatinine Ratio: 17 (ref 9–23)
BUN: 17 mg/dL (ref 6–24)
Bilirubin Total: 0.4 mg/dL (ref 0.0–1.2)
CO2: 21 mmol/L (ref 20–29)
Calcium: 9.3 mg/dL (ref 8.7–10.2)
Chloride: 106 mmol/L (ref 96–106)
Creatinine, Ser: 1.02 mg/dL — ABNORMAL HIGH (ref 0.57–1.00)
GFR calc Af Amer: 70 mL/min/{1.73_m2} (ref >59)
GFR calc non Af Amer: 60 mL/min/{1.73_m2} (ref >59)
Globulin, Total: 2.2 g/dL (ref 1.5–4.5)
Glucose: 98 mg/dL (ref 65–99)
Potassium: 3.8 mmol/L (ref 3.5–5.2)
Sodium: 145 mmol/L — ABNORMAL HIGH (ref 134–144)
Total Protein: 6.6 g/dL (ref 6.0–8.5)

## 2019-03-18 LAB — LIPID PANEL
Chol/HDL Ratio: 4.4 ratio (ref 0.0–4.4)
Cholesterol, Total: 189 mg/dL (ref 100–199)
HDL: 43 mg/dL (ref >39)
LDL Calculated: 84 mg/dL (ref 0–99)
Triglycerides: 311 mg/dL — ABNORMAL HIGH (ref 0–149)
VLDL Cholesterol Cal: 62 mg/dL — ABNORMAL HIGH (ref 5–40)

## 2019-03-18 LAB — TSH: TSH: 0.986 u[IU]/mL (ref 0.450–4.500)

## 2019-04-18 ENCOUNTER — Other Ambulatory Visit: Payer: Self-pay | Admitting: Family Medicine

## 2019-04-18 DIAGNOSIS — J00 Acute nasopharyngitis [common cold]: Secondary | ICD-10-CM

## 2019-04-18 NOTE — Telephone Encounter (Signed)
Not on current med list please advise

## 2019-05-04 ENCOUNTER — Encounter: Payer: Self-pay | Admitting: Family Medicine

## 2019-05-04 ENCOUNTER — Other Ambulatory Visit: Payer: Self-pay

## 2019-05-04 ENCOUNTER — Ambulatory Visit: Payer: 59 | Admitting: Family Medicine

## 2019-05-04 VITALS — BP 130/82 | HR 89 | Temp 98.7°F | Resp 16 | Ht 62.0 in | Wt 186.6 lb

## 2019-05-04 DIAGNOSIS — F411 Generalized anxiety disorder: Secondary | ICD-10-CM | POA: Diagnosis not present

## 2019-05-04 MED ORDER — HYDROXYZINE PAMOATE 25 MG PO CAPS: 25.0000 mg | ORAL_CAPSULE | Freq: Three times a day (TID) | ORAL | 0 refills | Status: DC | PRN

## 2019-05-04 MED ORDER — ESCITALOPRAM OXALATE 10 MG PO TABS: 10.0000 mg | ORAL_TABLET | Freq: Every day | ORAL | 2 refills | Status: DC

## 2019-05-04 NOTE — Patient Instructions (Signed)
° ° ° °  If you have lab work done today you will be contacted with your lab results within the next 2 weeks.  If you have not heard from us then please contact us. The fastest way to get your results is to register for My Chart. ° ° °IF you received an x-ray today, you will receive an invoice from [facility]. Please contact  Radiology at [phone] with questions or concerns regarding your invoice.  ° °IF you received labwork today, you will receive an invoice from [facility]. Please contact [facility] at [phone] with questions or concerns regarding your invoice.  ° °Our billing staff will not be able to assist you with questions regarding bills from these companies. ° °You will be contacted with the lab results as soon as they are available. The fastest way to get your results is to activate your My Chart account. Instructions are located on the last page of this paperwork. If you have not heard from us regarding the results in 2 weeks, please contact this office. °  ° ° ° °

## 2019-05-04 NOTE — Progress Notes (Signed)
7/9/202012:14 PM  April Robinson [birth year], 58 y.o., female [id number]  Chief Complaint  Patient presents with  . Anxiety    x 1 week     HPI:   Patient is a 58 y.o. female with past medical history significant for HTN who presents today for anxiety  Patient reports sudden onset of intense anxiety with panic attacks for past week Daughter living with her Caring for her elder parents, including more with alzheimer Very stressful job which she usually enjoys Had one similar episode of anxiety with panic attacks about 10 years ago when caring for her terminally ill brother Feeling embarrassed that she is not able to manage all the stress She is tearful, irritable, not sleeping  GAD 7 : Generalized Anxiety Score 05/04/2019  Nervous, Anxious, on Edge 1  Control/stop worrying 1  Worry too much - different things 2  Trouble relaxing 1  Restless 0  Easily annoyed or irritable 1  Afraid - awful might happen 2  Total GAD 7 Score 8  Anxiety Difficulty Somewhat difficult    Depression screen [facility] 2/9 05/04/2019 05/04/2019 03/17/2019  Decreased Interest 2 2 0  Down, Depressed, Hopeless 1 - 0  PHQ - 2 Score 3 2 0  Altered sleeping 1 - -  Change in appetite 0 - -  Feeling bad or failure about yourself  3 - -  Trouble concentrating 0 - -  Moving slowly or fidgety/restless 0 - -  Suicidal thoughts 0 - -  PHQ-9 Score 7 - -  Difficult doing work/chores Somewhat difficult - -  Some recent data might be hidden    Fall Risk  05/04/2019 03/17/2019 12/29/2018 12/08/2018 11/22/2018  Falls in the past year? 1 0 0 0 0  Number falls in past yr: 1 0 - 0 -  Injury with Fall? 0 0 - 1 0  Follow up Falls evaluation completed - Falls evaluation completed Falls evaluation completed -     Allergies  Allergen Reactions  . Meloxicam Shortness Of Breath and Nausea Only  . Miconazole Nitrate Other (See Comments)    SWELLING OF SOFT PALATE THAT CAUSED DIFFICULTY SWALLOWING  . Latex Itching, Rash and Other  (See Comments)    Fine, little bumps    Prior to Admission medications   Medication Sig Start Date End Date Taking? Authorizing Provider  albuterol (PROVENTIL HFA;VENTOLIN HFA) 108 (90 Base) MCG/ACT inhaler Inhale 2 puffs into the lungs every 6 (six) hours as needed for wheezing or shortness of breath. 10/12/17  Yes Rutherford Guys, MD  amLODipine (NORVASC) 2.5 MG tablet Take 1 tablet (2.5 mg total) by mouth daily. 03/17/19  Yes Rutherford Guys, MD  dexlansoprazole (DEXILANT) 60 MG capsule Take 1 capsule (60 mg total) by mouth daily. 03/17/19  Yes Rutherford Guys, MD  ibuprofen (ADVIL) 800 MG tablet Take 1 tablet (800 mg total) by mouth 3 (three) times daily as needed. 03/17/19  Yes Rutherford Guys, MD  Multiple Vitamin (MULTI-VITAMIN PO) Take by mouth daily.   Yes [provider]  Omega-3 Fatty Acids (FISH OIL PO) Take by mouth daily.   Yes [provider]  OVER THE COUNTER MEDICATION daily.   Yes [provider]  tamoxifen (NOLVADEX) 20 MG tablet TAKE 1 TABLET BY MOUTH EVERY DAY 12/05/18  Yes Magrinat, Virgie Dad, MD  valACYclovir (VALTREX) 500 MG tablet TAKE ONE TAB TWICE DAILY FOR 3-5 DAYS WITH OUTBREAK. 03/17/19  Yes Rutherford Guys, MD    Past  Medical History:  Diagnosis Date  . Alcoholism ([place])   . Arthritis   . Cancer ([place])    breast L  . Esophageal reflux   . Hiatal hernia   . Hx of radiation therapy 08/27/11 to 10/15/11   L breast  . Hyperlipemia   . Hypertension    past not on any medications currently  . Personal history of colonic polyps    TUBULAR ADENOMA  . Personal history of radiation therapy     Past Surgical History:  Procedure Laterality Date  . ABDOMINAL HYSTERECTOMY     partial for DUB  . BREAST LUMPECTOMY  07/23/11   left breast  . COLONOSCOPY    . LAPAROSCOPIC HYSTERECTOMY  1995    Social History   Tobacco Use  . Smoking status: Former Smoker    Types: Cigarettes    Quit date: 1983    Years since quitting: 37.5  .  Smokeless tobacco: Never Used  Substance Use Topics  . Alcohol use: Yes    Alcohol/week: 0.0 standard drinks    Comment: rare    Family History  Problem Relation Age of Onset  . Breast cancer Mother   . Cancer Mother        breast  . Hyperlipidemia Mother   . Diabetes Father   . Hypertension Father   . Cancer Father   . Hyperlipidemia Father   . Cancer Brother        NEUROENDOCRINE  . Hypertension Brother   . Arthritis Brother        hips  . Colon cancer Neg Hx     ROS Per hpi  OBJECTIVE:  Today's Vitals   05/04/19 1136  BP: 130/82  Pulse: 89  Resp: 16  Temp: 98.7 F (37.1 C)  TempSrc: Oral  SpO2: 98%  Weight: 186 lb 9.6 oz (84.6 kg)  Height: 5\' 2"  (1.575 m)   Body mass index is 34.13 kg/m.   Physical Exam Vitals signs and nursing note reviewed.  Constitutional:      Appearance: She is well-developed.  HENT:     Head: Normocephalic and atraumatic.  Eyes:     General: No scleral icterus.    Conjunctiva/sclera: Conjunctivae normal.     Pupils: Pupils are equal, round, and reactive to light.  Neck:     Musculoskeletal: Neck supple.  Pulmonary:     Effort: Pulmonary effort is normal.  Skin:    General: Skin is warm and dry.  Neurological:     Mental Status: She is alert and oriented to person, place, and time.  Psychiatric:     Comments: Tearful, anxious, withdrawn     ASSESSMENT and PLAN  1. GAD (generalized anxiety disorder) New diagnosis, not doing well, not harm to self or others, urgent referral for counseling, starting lexapro and vistaril, reviewed r/se/b. Excuse from work x 1 week.  - TSH - Ambulatory referral to Psychology  Other orders - escitalopram (LEXAPRO) 10 MG tablet; Take 1 tablet (10 mg total) by mouth at bedtime. - hydrOXYzine (VISTARIL) 25 MG capsule; Take 1 capsule (25 mg total) by mouth 3 (three) times daily as needed.  Return in about 1 week (around 05/11/2019).    Rutherford Guys, MD Primary Care at [place], [place] [address] Ph.  [phone] Fax [phone]

## 2019-05-05 ENCOUNTER — Ambulatory Visit
Admission: RE | Admit: 2019-05-05 | Discharge: 2019-05-05 | Disposition: A | Discharge status: Home / Self Care | Payer: 59 | Source: Ambulatory Visit | Attending: Oncology | Admitting: Oncology

## 2019-05-05 DIAGNOSIS — Z1231 Encounter for screening mammogram for malignant neoplasm of breast: Secondary | ICD-10-CM

## 2019-05-05 LAB — TSH: TSH: 1.9 u[IU]/mL (ref 0.450–4.500)

## 2019-05-10 ENCOUNTER — Other Ambulatory Visit: Payer: Self-pay

## 2019-05-10 ENCOUNTER — Inpatient Hospital Stay: Payer: 59 | Attending: Oncology | Admitting: Oncology

## 2019-05-10 ENCOUNTER — Inpatient Hospital Stay: Payer: 59

## 2019-05-10 VITALS — BP 113/63 | HR 85 | Temp 98.3°F | Resp 20 | Wt 187.2 lb

## 2019-05-10 DIAGNOSIS — C50412 Malignant neoplasm of upper-outer quadrant of left female breast: Secondary | ICD-10-CM | POA: Insufficient documentation

## 2019-05-10 DIAGNOSIS — Z79810 Long term (current) use of selective estrogen receptor modulators (SERMs): Secondary | ICD-10-CM | POA: Diagnosis not present

## 2019-05-10 DIAGNOSIS — Z170 Estrogen receptor positive status [ER+]: Secondary | ICD-10-CM

## 2019-05-10 DIAGNOSIS — Z923 Personal history of irradiation: Secondary | ICD-10-CM

## 2019-05-10 LAB — COMPREHENSIVE METABOLIC PANEL
ALT: 18 U/L (ref 0–44)
AST: 17 U/L (ref 15–41)
Albumin: 3.7 g/dL (ref 3.5–5.0)
Alkaline Phosphatase: 60 U/L (ref 38–126)
Anion gap: 7 (ref 5–15)
BUN: 15 mg/dL (ref 6–20)
CO2: 27 mmol/L (ref 22–32)
Calcium: 8.8 mg/dL — ABNORMAL LOW (ref 8.9–10.3)
Chloride: 108 mmol/L (ref 98–111)
Creatinine, Ser: 1.04 mg/dL — ABNORMAL HIGH (ref 0.44–1.00)
GFR calc Af Amer: >60 mL/min (ref >60)
GFR calc non Af Amer: 59 mL/min — ABNORMAL LOW (ref >60)
Glucose, Bld: 115 mg/dL — ABNORMAL HIGH (ref 70–99)
Potassium: 3.9 mmol/L (ref 3.5–5.1)
Sodium: 142 mmol/L (ref 135–145)
Total Bilirubin: 0.3 mg/dL (ref 0.3–1.2)
Total Protein: 6.7 g/dL (ref 6.5–8.1)

## 2019-05-10 LAB — CBC WITH DIFFERENTIAL/PLATELET
Abs Immature Granulocytes: 0.02 10*3/uL (ref 0.00–0.07)
Basophils Absolute: 0 10*3/uL (ref 0.0–0.1)
Basophils Relative: 1 %
Eosinophils Absolute: 0.1 10*3/uL (ref 0.0–0.5)
Eosinophils Relative: 1 %
HCT: 40.9 % (ref 36.0–46.0)
Hemoglobin: 13.4 g/dL (ref 12.0–15.0)
Immature Granulocytes: 0 %
Lymphocytes Relative: 39 %
Lymphs Abs: 2.5 10*3/uL (ref 0.7–4.0)
MCH: 28.7 pg (ref 26.0–34.0)
MCHC: 32.8 g/dL (ref 30.0–36.0)
MCV: 87.6 fL (ref 80.0–100.0)
Monocytes Absolute: 0.4 10*3/uL (ref 0.1–1.0)
Monocytes Relative: 6 %
Neutro Abs: 3.4 10*3/uL (ref 1.7–7.7)
Neutrophils Relative %: 53 %
Platelets: 234 10*3/uL (ref 150–400)
RBC: 4.67 MIL/uL (ref 3.87–5.11)
RDW: 13.7 % (ref 11.5–15.5)
WBC: 6.4 10*3/uL (ref 4.0–10.5)
nRBC: 0 % (ref 0.0–0.2)

## 2019-05-10 MED ORDER — VENLAFAXINE HCL ER 37.5 MG PO CP24: 37.5000 mg | ORAL_CAPSULE | Freq: Every day | ORAL | 4 refills | Status: DC

## 2019-05-10 NOTE — Progress Notes (Signed)
ID: April Robinson   DOB: Jul 01, 1961  MR#: [id number]  KGS#:[id number]  Patient Care Team: April Guys, MD as PCP - General (Family Medicine) , April Dad, MD as Consulting Physician (Hematology and Oncology) April Cote, NP as Consulting Physician (Obstetrics and Gynecology) [place], April Lines, MD as Consulting Physician (Gastroenterology) OTHER MD:   CHIEF COMPLAINT:  Hx of Left Breast Cancer  CURRENT TREATMENT: tamoxifen    INTERVAL HISTORY:  April [place] returns today for follow-up of her estrogen receptor positive breast cancer.   She continues on tamoxifen with good tolerance. She reports hot flashes and vaginal discharge. She does not take anything for the hot flashes.  Since her last visit, she underwent bilateral screening mammography with tomography at [facility] on 05/05/2019 showing: breast density category B; no evidence of malignancy in either breast.  She also underwent left knee x-ray on 05/06/2018, which showed stable mild narrowing of the medial compartment without signs of osteophytes or advanced degenerative joint disease.  She also underwent lumbar spine x-ray on 10/15/2018, which was negative.  She also underwent chest x-ray on 12/29/2018, which showed no active cardiopulmonary disease.   REVIEW OF SYSTEMS:  April [place] reports she has been very overwhelmed and anxious recently. 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 She notes her job triggers a lot of her anxiety. She saw her PCP last week and was prescribed lexapro and vistaril. Her daughter and her daughter's children live with her. A detailed review of systems was otherwise stable.     BREAST CANCER HISTORY: From the original intake note:  The patient had a questionable palpable finding in the left breast which was evaluated in July 2009 and it was recommended that she return for further evaluation but actually did  not show.  She had bilateral diagnostic mammography in March 2011 which showed minimal duct ectasia of the left breast at the location in question.  Again, 11-monthinterval mammography was recommended and this was repeated August 2012.  There was minimally a more prominent ductal ectasia and MRI was recommended for further evaluation.  This was performed on June 22, 2011, and it showed a 1.2 cm enhancing mass in the posterior aspect of an area of irregular linear enhancement.  Anteriorly to this, there was a 6 mm area which is the area of dilated duct noted by prior evaluation.   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 The tumor was estrogen receptor positive at 72%, progesterone receptor positive at 5%, with a very low MIB1 at 4%.  HER2 was not amplified with a ratio by [facility] of 1.10.   The patient's subsequent history is as detailed below.  PAST MEDICAL HISTORY: Past Medical History:  Diagnosis Date  . Alcoholism ([place]   . Arthritis   . Cancer ([place]    breast L  . Esophageal reflux   . Hiatal hernia   . Hx of radiation therapy 08/27/11 to 10/15/11   L breast  . Hyperlipemia   . Hypertension    past not on any medications currently  . Personal history of colonic polyps    TUBULAR ADENOMA  . Personal history of radiation therapy     PAST SURGICAL HISTORY: Past Surgical History:  Procedure Laterality  Date  . ABDOMINAL HYSTERECTOMY     partial for DUB  . BREAST LUMPECTOMY  07/23/11   left breast  . COLONOSCOPY    . LAPAROSCOPIC HYSTERECTOMY  1995  no BSO, still has both ovaries   FAMILY HISTORY Family History  Problem Relation Age of Onset  . Breast cancer Mother   . Cancer Mother        breast  . Hyperlipidemia Mother   . Diabetes Father   . Hypertension Father   . Cancer Father   .  Hyperlipidemia Father   . Cancer Brother        NEUROENDOCRINE  . Hypertension Brother   . Arthritis Brother        hips  . Colon cancer Neg Hx   The patient's father is alive at age 55. The patient's father has breast cancer. The patient's mother is alive at age 82.  She was diagnosed with breast cancer at age 62.  The patient had 2 brothers; one of them died from a neuroendocrine tumor.  She has 1 sister.  There is no other breast or ovarian cancer in the immediate family.   GYNECOLOGIC HISTORY:  (Reviewed 02/19/2014) She had menarche, age 53. She is GX P3 with first live birth at age 36. She had a hysterectomy in 1993, but did not have her ovaries removed.  She took hormone replacement for at least 2 years.     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  Her husband, April [place], works as a Administrator.  The patient's children from a prior marriage are:  April [place], 34, who lives in [place] and works as a Production assistant, radio.  April [place] who works as a Pharmacist, hospital of ages between 70 and 12; she is also a Presenter, broadcasting.  April [place], 29, who studies accounting at [employer]. The patient has 8 grandchildren. She is very active in her local church    ADVANCED DIRECTIVES: Not in place   HEALTH MAINTENANCE:  Social History   Tobacco Use  . Smoking status: Former Smoker    Types: Cigarettes    Quit date: 1983    Years since quitting: 37.5  . Smokeless tobacco: Never Used  Substance Use Topics  . Alcohol use: Yes    Alcohol/week: 0.0 standard drinks    Comment: rare  . Drug use: No     Colonoscopy: 10/02/2016/ normal/ Dr. Raquel [place]  PAP:  [facility], Dr. Toney [place]  Bone density:  09/18/2013, normal  Lipid panel:  [facility], Dr. Etter [place]   Allergies  Allergen Reactions  . Meloxicam Shortness Of Breath and Nausea Only  . Miconazole Nitrate Other (See Comments)     SWELLING OF SOFT PALATE THAT CAUSED DIFFICULTY SWALLOWING  . Latex Itching, Rash and Other (See Comments)    Fine, little bumps    Current Outpatient Medications  Medication Sig Dispense Refill  . albuterol (PROVENTIL HFA;VENTOLIN HFA) 108 (90 Base) MCG/ACT inhaler Inhale 2 puffs into the lungs every 6 (six) hours as needed for wheezing or shortness of breath. 1 Inhaler 0  . amLODipine (NORVASC) 2.5 MG tablet Take 1 tablet (2.5 mg total) by mouth daily. 90 tablet 1  . dexlansoprazole (DEXILANT) 60 MG capsule Take 1 capsule (60 mg total) by mouth daily. 90 capsule 3  . hydrOXYzine (VISTARIL) 25 MG capsule Take 1 capsule (25 mg total) by mouth 3 (three)  times daily as needed. 30 capsule 0  . ibuprofen (ADVIL) 800 MG tablet Take 1 tablet (800 mg total) by mouth 3 (three) times daily as needed. 30 tablet 2  . Multiple Vitamin (MULTI-VITAMIN PO) Take by mouth daily.    . Omega-3 Fatty Acids (FISH OIL PO) Take by mouth daily.    Marland Kitchen OVER THE COUNTER MEDICATION daily.    . tamoxifen (NOLVADEX) 20 MG tablet TAKE 1 TABLET BY MOUTH EVERY DAY 90 tablet 1  . valACYclovir (VALTREX) 500 MG tablet TAKE ONE TAB TWICE DAILY FOR 3-5 DAYS WITH OUTBREAK. 10 tablet 2  . venlafaxine XR (EFFEXOR-XR) 37.5 MG 24 hr capsule Take 1 capsule (37.5 mg total) by mouth daily with breakfast. 90 capsule 4   Current Facility-Administered Medications  Medication Dose Route Frequency Provider Last Rate Last Dose  . 0.9 %  sodium chloride infusion  500 mL Intravenous Continuous Pyrtle, April Lines, MD        OBJECTIVE: Middle-aged African American woman who appears stated age  Vitals:   05/10/19 1345  BP: 113/63  Pulse: 85  Resp: 20  Temp: 98.3 F (36.8 C)  SpO2: 100%     Body mass index is 34.24 kg/m.    ECOG FS: 1 Filed Weights   05/10/19 1345  Weight: 187 lb 3.2 oz (84.9 kg)   Sclerae unicteric, EOMs intact Wearing a mask No cervical or supraclavicular adenopathy Lungs no rales or rhonchi Heart regular rate and  rhythm Abd soft, nontender, positive bowel sounds MSK no focal spinal tenderness, no upper extremity lymphedema Neuro: nonfocal, well oriented, appropriate affect Breasts: The right breast is unremarkable.  The left breast is status post lumpectomy and radiation with no evidence of disease recurrence.  Both axillae are benign.  LAB RESULTS: Lab Results  Component Value Date   WBC 6.4 05/10/2019   NEUTROABS 3.4 05/10/2019   HGB 13.4 05/10/2019   HCT 40.9 05/10/2019   MCV 87.6 05/10/2019   PLT 234 05/10/2019      Chemistry      Component Value Date/Time   NA 142 05/10/2019 1314   NA 145 (H) 03/17/2019 1427   NA 143 09/03/2016 0830   K 3.9 05/10/2019 1314   K 4.0 09/03/2016 0830   CL 108 05/10/2019 1314   CL 107 01/30/2013 1509   CO2 27 05/10/2019 1314   CO2 26 09/03/2016 0830   BUN 15 05/10/2019 1314   BUN 17 03/17/2019 1427   BUN 14.6 09/03/2016 0830   CREATININE 1.04 (H) 05/10/2019 1314   CREATININE 1.1 09/03/2016 0830      Component Value Date/Time   CALCIUM 8.8 (L) 05/10/2019 1314   CALCIUM 9.3 09/03/2016 0830   ALKPHOS 60 05/10/2019 1314   ALKPHOS 67 09/03/2016 0830   AST 17 05/10/2019 1314   AST 21 09/03/2016 0830   ALT 18 05/10/2019 1314   ALT 22 09/03/2016 0830   BILITOT 0.3 05/10/2019 1314   BILITOT 0.4 03/17/2019 1427   BILITOT 0.51 09/03/2016 0830        STUDIES: Mm 3d Screen Breast Bilateral  Result Date: 05/08/2019 CLINICAL DATA:  Screening. EXAM: DIGITAL SCREENING BILATERAL MAMMOGRAM WITH TOMO AND CAD COMPARISON:  Previous exam(s). ACR Breast Density Category b: There are scattered areas of fibroglandular density. FINDINGS: There are no findings suspicious for malignancy. Images were processed with CAD. IMPRESSION: No mammographic evidence of malignancy. A result letter of this screening mammogram will be mailed directly to the patient. RECOMMENDATION: Screening mammogram in one  year. (Code:SM-B-01Y) BI-RADS CATEGORY  1: Negative. Electronically  Signed   By: Lillia Mountain M.D.   On: 05/08/2019 08:22   ASSESSMENT: 58 y.o.  [place] woman   (1)  status post left lumpectomy and sentinel lymph node biopsy September of 2012 for a T1c N0, Stage IA  invasive ductal carcinoma, grade 2, estrogen and progesterone receptor positive with a very low MIB-1 and no HER-2 amplification.   (2)  She completed adjuvant radiation 10/15/2011   (3) started tamoxifen 10/27/2011, the plan being to continue for total of 10 years until 2023.  (4) s/p remote TAH, with ovaries still intact    PLAN:  April [place] is now nearly 8 years out from definitive surgery for her breast cancer with no evidence of disease recurrence.  This is very favorable.  She is tolerating tamoxifen well and the plan will be to continue that for a total of 10 years.  She is under a great deal of stress and we discussed that at length today.  I have asked our chaplain to give her a call if possible.  She was started on Lexapro by her primary care physician but Jayanna tells me it made her loopy.  She is going off that medication, which she only took 1 time, and she will start venlafaxine at 37.5 mg.  Venlafaxine in addition to helping with stress is very good for hot flashes.  I do not think she will have any problem at this dose but it may not be effective.  If after taking 37.5 mg 2 or 3 weeks she does not have the effect she wants she will call us and we will up the dose to 75 mg which would still be half to one quarter dose  I encouraged her to exercise on a regular basis.  She will see [place] again in 1 year.  She knows to call for any other issue that may develop before the next visit.  , April Dad, MD  05/10/19 2:30 PM Medical Oncology and Hematology [facility] [address] [place], [place] [address] Tel. [phone]    Fax. [phone]    I, Wilburn Mylar, am acting as scribe for Dr. Virgie [place]. .  I, Lurline Del MD, have reviewed the  above documentation for accuracy and completeness, and I agree with the above.

## 2019-05-11 ENCOUNTER — Telehealth: Payer: Self-pay | Admitting: Oncology

## 2019-05-11 ENCOUNTER — Ambulatory Visit: Payer: 59 | Admitting: Family Medicine

## 2019-05-11 NOTE — Telephone Encounter (Signed)
I could not reach regarding schedule  °

## 2019-05-12 ENCOUNTER — Encounter: Payer: Self-pay | Admitting: Family Medicine

## 2019-05-16 ENCOUNTER — Other Ambulatory Visit: Payer: Self-pay

## 2019-05-16 ENCOUNTER — Encounter: Payer: Self-pay | Admitting: General Practice

## 2019-05-16 ENCOUNTER — Telehealth (INDEPENDENT_AMBULATORY_CARE_PROVIDER_SITE_OTHER): Payer: 59 | Admitting: Family Medicine

## 2019-05-16 DIAGNOSIS — Z566 Other physical and mental strain related to work: Secondary | ICD-10-CM | POA: Diagnosis not present

## 2019-05-16 DIAGNOSIS — F411 Generalized anxiety disorder: Secondary | ICD-10-CM

## 2019-05-16 NOTE — Progress Notes (Signed)
[facility] [facility]ual [facility] Note  LVM on second attempt to reach Ms Loos by phone per referral from Dr Jana Hakim. Encouraged callback and will keep trying.   [place], [place], [facility] Pager [phone] Voicemail [phone]

## 2019-05-16 NOTE — Progress Notes (Signed)
Virtual Visit Note  I connected with patient on 05/16/19 at 516pm by phone and verified that I am speaking with the correct person using two identifiers. April Robinson is currently located at car and patient is currently with them during visit. The provider, Rutherford Guys, MD is located in their office at time of visit.  I discussed the limitations, risks, security and privacy concerns of performing an evaluation and management service by telephone and the availability of in person appointments. I also discussed with the patient that there may be a patient responsible charge related to this service. The patient expressed understanding and agreed to proceed.   CC: anxiety  HPI ? Last OV 2 weeks ago started lexapro and vistaril Was not tolerating, saw onc a week ago, changed to effexor 37.5mg  Was not able to stay out of work Work is main stressor, looking at other jobs Has upcoming vacation Doing better on effexor  Allergies  Allergen Reactions  . Meloxicam Shortness Of Breath and Nausea Only  . Miconazole Nitrate Other (See Comments)    SWELLING OF SOFT PALATE THAT CAUSED DIFFICULTY SWALLOWING  . Latex Itching, Rash and Other (See Comments)    Fine, little bumps    Prior to Admission medications   Medication Sig Start Date End Date Taking? Authorizing Provider  albuterol (PROVENTIL HFA;VENTOLIN HFA) 108 (90 Base) MCG/ACT inhaler Inhale 2 puffs into the lungs every 6 (six) hours as needed for wheezing or shortness of breath. 10/12/17   Rutherford Guys, MD  amLODipine (NORVASC) 2.5 MG tablet Take 1 tablet (2.5 mg total) by mouth daily. 03/17/19   Rutherford Guys, MD  dexlansoprazole (DEXILANT) 60 MG capsule Take 1 capsule (60 mg total) by mouth daily. 03/17/19   Rutherford Guys, MD  hydrOXYzine (VISTARIL) 25 MG capsule Take 1 capsule (25 mg total) by mouth 3 (three) times daily as needed. 05/04/19   Rutherford Guys, MD  ibuprofen (ADVIL) 800 MG tablet Take 1 tablet (800 mg  total) by mouth 3 (three) times daily as needed. 03/17/19   Rutherford Guys, MD  Multiple Vitamin (MULTI-VITAMIN PO) Take by mouth daily.    [provider]  Omega-3 Fatty Acids (FISH OIL PO) Take by mouth daily.    [provider]  OVER THE COUNTER MEDICATION daily.    [provider]  tamoxifen (NOLVADEX) 20 MG tablet TAKE 1 TABLET BY MOUTH EVERY DAY 12/05/18   Magrinat, Virgie Dad, MD  valACYclovir (VALTREX) 500 MG tablet TAKE ONE TAB TWICE DAILY FOR 3-5 DAYS WITH OUTBREAK. 03/17/19   Rutherford Guys, MD  venlafaxine XR (EFFEXOR-XR) 37.5 MG 24 hr capsule Take 1 capsule (37.5 mg total) by mouth daily with breakfast. 05/10/19   Magrinat, Virgie Dad, MD    Past Medical History:  Diagnosis Date  . Alcoholism ([place])   . Arthritis   . Cancer ([place])    breast L  . Esophageal reflux   . Hiatal hernia   . Hx of radiation therapy 08/27/11 to 10/15/11   L breast  . Hyperlipemia   . Hypertension    past not on any medications currently  . Personal history of colonic polyps    TUBULAR ADENOMA  . Personal history of radiation therapy     Past Surgical History:  Procedure Laterality Date  . ABDOMINAL HYSTERECTOMY     partial for DUB  . BREAST LUMPECTOMY  07/23/11   left breast  . COLONOSCOPY    . LAPAROSCOPIC  HYSTERECTOMY  1995    Social History   Tobacco Use  . Smoking status: Former Smoker    Types: Cigarettes    Quit date: 1983    Years since quitting: 37.5  . Smokeless tobacco: Never Used  Substance Use Topics  . Alcohol use: Yes    Alcohol/week: 0.0 standard drinks    Comment: rare    Family History  Problem Relation Age of Onset  . Breast cancer Mother   . Cancer Mother        breast  . Hyperlipidemia Mother   . Diabetes Father   . Hypertension Father   . Cancer Father   . Hyperlipidemia Father   . Cancer Brother        NEUROENDOCRINE  . Hypertension Brother   . Arthritis Brother        hips  . Colon cancer Neg Hx     ROS Per hpi   Objective  Vitals as reported by the patient: none   ASSESSMENT and PLAN  1. GAD (generalized anxiety disorder) 2. Work-related stress Doing better on effexor.  Has upcoming appt with behavioral health for counseling  FOLLOW-UP: 3-4 weeks   The above assessment and management plan was discussed with the patient. The patient verbalized understanding of and has agreed to the management plan. Patient is aware to call the clinic if symptoms persist or worsen. Patient is aware when to return to the clinic for a follow-up visit. Patient educated on when it is appropriate to go to the emergency department.    I provided 15 minutes of non-face-to-face time during this encounter.  Rutherford Guys, MD Primary Care at [place], [place] [address] Ph.  [phone] Fax [phone]

## 2019-05-16 NOTE — Progress Notes (Signed)
Follow ing up with anxiety, the is not taking the hydroxyzine and escitopram. She says she makes its makes her feel sleepy and crazy. She says the trigger for her anxiety is due to her job. When she was taken out of work she had to return early because her job would not honor the letter. The anxiety screening score 21. Her oncologist put her on effexor xr, she says that makes her feel much better, "its takes the edge off"

## 2019-05-23 ENCOUNTER — Ambulatory Visit: Payer: 59 | Admitting: Professional

## 2019-05-30 ENCOUNTER — Telehealth: Payer: Self-pay | Admitting: Family Medicine

## 2019-05-30 NOTE — Telephone Encounter (Signed)
Patient would like another referral for [facility] put in did not get the VO with [facility]

## 2019-05-30 NOTE — Telephone Encounter (Signed)
Please advise 

## 2019-05-31 NOTE — Telephone Encounter (Signed)
Please clarify your message, I am unsure of abbreviations being used. Thanks.

## 2019-06-06 ENCOUNTER — Ambulatory Visit (INDEPENDENT_AMBULATORY_CARE_PROVIDER_SITE_OTHER): Payer: 59 | Admitting: Professional

## 2019-06-06 ENCOUNTER — Telehealth (INDEPENDENT_AMBULATORY_CARE_PROVIDER_SITE_OTHER): Payer: 59 | Admitting: Family Medicine

## 2019-06-06 ENCOUNTER — Other Ambulatory Visit: Payer: Self-pay

## 2019-06-06 DIAGNOSIS — F411 Generalized anxiety disorder: Secondary | ICD-10-CM | POA: Diagnosis not present

## 2019-06-06 DIAGNOSIS — Z566 Other physical and mental strain related to work: Secondary | ICD-10-CM | POA: Diagnosis not present

## 2019-06-06 NOTE — Progress Notes (Signed)
Follow up on anxiety, taking the Effexor. Says she is doing a lot better emotionally, the medication seems to be working out for her. Did counseling session today so she feels really good about that. Completed gad 7 form  Score of 11.

## 2019-06-06 NOTE — Progress Notes (Signed)
Virtual Visit Note  I connected with patient on 06/06/19 at 523pm by video via doximity and verified that I am speaking with the correct person using two identifiers. April [place] is currently located at home and patient is currently with them during visit. The provider, Rutherford Guys, MD is located in their office at time of visit.  I discussed the limitations, risks, security and privacy concerns of performing an evaluation and management service by telephone and the availability of in person appointments. I also discussed with the patient that there may be a patient responsible charge related to this service. The patient expressed understanding and agreed to proceed.   CC: anxiety  HPI ? Last seen July 2020 Had just been started on effexor by onc Just came back from vacation - had a really good time Back at work - doing ok Had first counseling appt yesterday, went well Current GAD 7 = 11 GAD 7 : Generalized Anxiety Score 05/04/2019  Nervous, Anxious, on Edge 1  Control/stop worrying 1  Worry too much - different things 2  Trouble relaxing 1  Restless 0  Easily annoyed or irritable 1  Afraid - awful might happen 2  Total GAD 7 Score 8  Anxiety Difficulty Somewhat difficult     Allergies  Allergen Reactions  . Meloxicam Shortness Of Breath and Nausea Only  . Miconazole Nitrate Other (See Comments)    SWELLING OF SOFT PALATE THAT CAUSED DIFFICULTY SWALLOWING  . Latex Itching, Rash and Other (See Comments)    Fine, little bumps    Prior to Admission medications   Medication Sig Start Date End Date Taking? Authorizing Provider  albuterol (PROVENTIL HFA;VENTOLIN HFA) 108 (90 Base) MCG/ACT inhaler Inhale 2 puffs into the lungs every 6 (six) hours as needed for wheezing or shortness of breath. 10/12/17   Rutherford Guys, MD  amLODipine (NORVASC) 2.5 MG tablet Take 1 tablet (2.5 mg total) by mouth daily. 03/17/19   Rutherford Guys, MD  dexlansoprazole (DEXILANT) 60 MG  capsule Take 1 capsule (60 mg total) by mouth daily. 03/17/19   Rutherford Guys, MD  hydrOXYzine (VISTARIL) 25 MG capsule Take 1 capsule (25 mg total) by mouth 3 (three) times daily as needed. 05/04/19   Rutherford Guys, MD  ibuprofen (ADVIL) 800 MG tablet Take 1 tablet (800 mg total) by mouth 3 (three) times daily as needed. 03/17/19   Rutherford Guys, MD  Multiple Vitamin (MULTI-VITAMIN PO) Take by mouth daily.    [provider]  Omega-3 Fatty Acids (FISH OIL PO) Take by mouth daily.    [provider]  OVER THE COUNTER MEDICATION daily.    [provider]  tamoxifen (NOLVADEX) 20 MG tablet TAKE 1 TABLET BY MOUTH EVERY DAY 12/05/18   Magrinat, Virgie Dad, MD  valACYclovir (VALTREX) 500 MG tablet TAKE ONE TAB TWICE DAILY FOR 3-5 DAYS WITH OUTBREAK. 03/17/19   Rutherford Guys, MD  venlafaxine XR (EFFEXOR-XR) 37.5 MG 24 hr capsule Take 1 capsule (37.5 mg total) by mouth daily with breakfast. 05/10/19   Magrinat, Virgie Dad, MD    Past Medical History:  Diagnosis Date  . Alcoholism ([place])   . Arthritis   . Cancer ([place])    breast L  . Esophageal reflux   . Hiatal hernia   . Hx of radiation therapy 08/27/11 to 10/15/11   L breast  . Hyperlipemia   . Hypertension    past not on any medications currently  .  Personal history of colonic polyps    TUBULAR ADENOMA  . Personal history of radiation therapy     Past Surgical History:  Procedure Laterality Date  . ABDOMINAL HYSTERECTOMY     partial for DUB  . BREAST LUMPECTOMY  07/23/11   left breast  . COLONOSCOPY    . LAPAROSCOPIC HYSTERECTOMY  1995    Social History   Tobacco Use  . Smoking status: Former Smoker    Types: Cigarettes    Quit date: 1983    Years since quitting: 37.6  . Smokeless tobacco: Never Used  Substance Use Topics  . Alcohol use: Yes    Alcohol/week: 0.0 standard drinks    Comment: rare    Family History  Problem Relation Age of Onset  . Breast cancer Mother   . Cancer Mother         breast  . Hyperlipidemia Mother   . Diabetes Father   . Hypertension Father   . Cancer Father   . Hyperlipidemia Father   . Cancer Brother        NEUROENDOCRINE  . Hypertension Brother   . Arthritis Brother        hips  . Colon cancer Neg Hx     ROS Per hpi  Objective  Vitals as reported by the patient: none   ASSESSMENT and PLAN  1. GAD (generalized anxiety disorder) 2. Work-related stress Improved but not controlled yet. Patient declines increasing medication at this time. Wants to see how she does with adding counseling. Advised to call me if she changes her mind about current dose.  FOLLOW-UP: 3 months, sooner if needed   The above assessment and management plan was discussed with the patient. The patient verbalized understanding of and has agreed to the management plan. Patient is aware to call the clinic if symptoms persist or worsen. Patient is aware when to return to the clinic for a follow-up visit. Patient educated on when it is appropriate to go to the emergency department.    I provided 10 minutes of non-face-to-face time during this encounter.  Rutherford Guys, MD Primary Care at [facility], [place] [address] Ph.  [phone] Fax [phone]

## 2019-06-09 NOTE — Telephone Encounter (Signed)
Please let patient know that I recommend she call either of these, referral not needed. For therapy -- 1. [facility] [phone] 2. [facility] - [phone] 3. [facility] - [phone] (do not file insurance) 5. [facility] accepts most major insurances. [phone] or email [email or web address]

## 2019-06-09 NOTE — Telephone Encounter (Signed)
Patient needs another referral to [facility]  She waited on her Virtual Office visit with [facility] and never received a call per patient   Thank  You

## 2019-06-12 NOTE — Telephone Encounter (Signed)
Pt approved me to leave message on vm due to her being at work

## 2019-06-15 ENCOUNTER — Ambulatory Visit (INDEPENDENT_AMBULATORY_CARE_PROVIDER_SITE_OTHER): Payer: 59 | Admitting: Professional

## 2019-06-15 DIAGNOSIS — F411 Generalized anxiety disorder: Secondary | ICD-10-CM

## 2019-06-20 ENCOUNTER — Ambulatory Visit: Payer: Self-pay | Admitting: Professional

## 2019-06-22 ENCOUNTER — Ambulatory Visit: Payer: 59 | Admitting: Family Medicine

## 2019-06-23 ENCOUNTER — Encounter: Payer: 59 | Admitting: Family Medicine

## 2019-06-23 ENCOUNTER — Encounter: Payer: Self-pay | Admitting: Family Medicine

## 2019-06-23 ENCOUNTER — Ambulatory Visit: Payer: 59

## 2019-06-23 ENCOUNTER — Other Ambulatory Visit: Payer: Self-pay

## 2019-06-23 VITALS — BP 136/82 | HR 84 | Temp 98.4°F | Ht 62.0 in | Wt 184.0 lb

## 2019-06-23 NOTE — Progress Notes (Signed)
Nurse visit only for flu vaccine This encounter was created in error - please disregard.

## 2019-06-29 ENCOUNTER — Ambulatory Visit: Payer: 59 | Admitting: Professional

## 2019-07-04 ENCOUNTER — Ambulatory Visit (INDEPENDENT_AMBULATORY_CARE_PROVIDER_SITE_OTHER): Payer: 59 | Admitting: Professional

## 2019-07-04 DIAGNOSIS — F411 Generalized anxiety disorder: Secondary | ICD-10-CM | POA: Diagnosis not present

## 2019-07-06 ENCOUNTER — Ambulatory Visit: Payer: 59 | Admitting: Professional

## 2019-07-10 ENCOUNTER — Ambulatory Visit (INDEPENDENT_AMBULATORY_CARE_PROVIDER_SITE_OTHER): Payer: 59 | Admitting: Professional

## 2019-07-10 DIAGNOSIS — F411 Generalized anxiety disorder: Secondary | ICD-10-CM | POA: Diagnosis not present

## 2019-07-24 ENCOUNTER — Ambulatory Visit (INDEPENDENT_AMBULATORY_CARE_PROVIDER_SITE_OTHER): Payer: 59 | Admitting: Professional

## 2019-07-24 DIAGNOSIS — F411 Generalized anxiety disorder: Secondary | ICD-10-CM | POA: Diagnosis not present

## 2019-08-07 ENCOUNTER — Ambulatory Visit: Payer: 59 | Admitting: Professional

## 2019-08-15 ENCOUNTER — Ambulatory Visit: Payer: 59 | Admitting: Professional

## 2019-08-30 ENCOUNTER — Ambulatory Visit (INDEPENDENT_AMBULATORY_CARE_PROVIDER_SITE_OTHER): Payer: 59 | Admitting: Professional

## 2019-08-30 DIAGNOSIS — F411 Generalized anxiety disorder: Secondary | ICD-10-CM | POA: Diagnosis not present
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
## 2019-09-11 ENCOUNTER — Other Ambulatory Visit: Payer: Self-pay | Admitting: Family Medicine

## 2019-09-14 ENCOUNTER — Other Ambulatory Visit: Payer: Self-pay

## 2019-09-14 ENCOUNTER — Telehealth (INDEPENDENT_AMBULATORY_CARE_PROVIDER_SITE_OTHER): Payer: 59 | Admitting: Family Medicine

## 2019-09-14 DIAGNOSIS — Z20822 Contact with and (suspected) exposure to covid-19: Secondary | ICD-10-CM

## 2019-09-14 DIAGNOSIS — Z20828 Contact with and (suspected) exposure to other viral communicable diseases: Secondary | ICD-10-CM | POA: Diagnosis not present

## 2019-09-14 NOTE — Progress Notes (Signed)
Virtual Visit Note  I connected with patient on 09/14/19 at 357pm by phone and verified that I am speaking with the correct person using two identifiers. April Robinson is currently located at car and patient is currently with them during visit. The provider, Rutherford Guys, MD is located in their office at time of visit.  I discussed the limitations, risks, security and privacy concerns of performing an evaluation and management service by telephone and the availability of in person appointments. I also discussed with the patient that there may be a patient responsible charge related to this service. The patient expressed understanding and agreed to proceed.   CC: illness  HPI  For past 5 days having cough, chills, fatigue, low grade last night, sneezing No body aches, no headaches, mild diarrhea that has resolved, no vomiting, no loss or taste smell, no SOB No fever today Feeling better today Has been working at the office Has been taking nyquil, oral antihistamine  Her anxiety is controlled on effexor Continues to work on coping strategies as discussed with therapist Still job searching Does not feel as trapped as before  Does not check BP at home Allergies  Allergen Reactions  . Meloxicam Shortness Of Breath and Nausea Only  . Miconazole Nitrate Other (See Comments)    SWELLING OF SOFT PALATE THAT CAUSED DIFFICULTY SWALLOWING  . Latex Itching, Rash and Other (See Comments)    Fine, little bumps    Prior to Admission medications   Medication Sig Start Date End Date Taking? Authorizing Provider  albuterol (PROVENTIL HFA;VENTOLIN HFA) 108 (90 Base) MCG/ACT inhaler Inhale 2 puffs into the lungs every 6 (six) hours as needed for wheezing or shortness of breath. 10/12/17  Yes Rutherford Guys, MD  amLODipine (NORVASC) 2.5 MG tablet TAKE 1 TABLET BY MOUTH EVERY DAY 09/11/19  Yes Rutherford Guys, MD  dexlansoprazole (DEXILANT) 60 MG capsule Take 1 capsule (60 mg total) by  mouth daily. 03/17/19  Yes Rutherford Guys, MD  hydrOXYzine (VISTARIL) 25 MG capsule Take 1 capsule (25 mg total) by mouth 3 (three) times daily as needed. 05/04/19  Yes Rutherford Guys, MD  ibuprofen (ADVIL) 800 MG tablet Take 1 tablet (800 mg total) by mouth 3 (three) times daily as needed. 03/17/19  Yes Rutherford Guys, MD  Multiple Vitamin (MULTI-VITAMIN PO) Take by mouth daily.   Yes [provider]  Omega-3 Fatty Acids (FISH OIL PO) Take by mouth daily.   Yes [provider]  OVER THE COUNTER MEDICATION daily.   Yes [provider]  tamoxifen (NOLVADEX) 20 MG tablet TAKE 1 TABLET BY MOUTH EVERY DAY 12/05/18  Yes Magrinat, Virgie Dad, MD  valACYclovir (VALTREX) 500 MG tablet TAKE ONE TAB TWICE DAILY FOR 3-5 DAYS WITH OUTBREAK. 03/17/19  Yes Rutherford Guys, MD  venlafaxine XR (EFFEXOR-XR) 37.5 MG 24 hr capsule Take 1 capsule (37.5 mg total) by mouth daily with breakfast. 05/10/19  Yes Magrinat, Virgie Dad, MD    Past Medical History:  Diagnosis Date  . Alcoholism ([place])   . Arthritis   . Cancer ([place])    breast L  . Esophageal reflux   . Hiatal hernia   . Hx of radiation therapy 08/27/11 to 10/15/11   L breast  . Hyperlipemia   . Hypertension    past not on any medications currently  . Personal history of colonic polyps    TUBULAR ADENOMA  . Personal history of radiation therapy  Past Surgical History:  Procedure Laterality Date  . ABDOMINAL HYSTERECTOMY     partial for DUB  . BREAST LUMPECTOMY  07/23/11   left breast  . COLONOSCOPY    . LAPAROSCOPIC HYSTERECTOMY  1995    Social History   Tobacco Use  . Smoking status: Former Smoker    Types: Cigarettes    Quit date: 1983    Years since quitting: 37.9  . Smokeless tobacco: Never Used  Substance Use Topics  . Alcohol use: Yes    Alcohol/week: 0.0 standard drinks    Comment: rare    Family History  Problem Relation Age of Onset  . Breast cancer Mother   . Cancer Mother        breast  .  Hyperlipidemia Mother   . Diabetes Father   . Hypertension Father   . Cancer Father   . Hyperlipidemia Father   . Cancer Brother        NEUROENDOCRINE  . Hypertension Brother   . Arthritis Brother        hips  . Colon cancer Neg Hx     ROS Per hpi  Objective  Vitals as reported by the patient:   ASSESSMENT and PLAN  1. Suspected COVID-19 virus infection Patient evaluated, referred for testing and sent home with instructions for home care and Quarantine. Instructed to seek further care if symptoms worsen.   - Novel Coronavirus, NAA ([facility])  FOLLOW-UP: prn   The above assessment and management plan was discussed with the patient. The patient verbalized understanding of and has agreed to the management plan. Patient is aware to call the clinic if symptoms persist or worsen. Patient is aware when to return to the clinic for a follow-up visit. Patient educated on when it is appropriate to go to the emergency department.    I provided 14 minutes of non-face-to-face time during this encounter.  Rutherford Guys, MD Primary Care at [place],  [address] Ph.  [phone] Fax [phone]

## 2019-09-14 NOTE — Progress Notes (Signed)
Pt came into the office today with chills, coughing, sneezing, drainage, fatigue, and fever on last night. She feels she is experiencing a cold. She has been monitoring her temp today, no fever Taking otc's for her sx. She is also having hand pain, thinks it may be arthritis. Taking no meds for the pain in the hand at this time. Pharmacy and meds verified. She is due to go out of town next week.

## 2019-09-15 ENCOUNTER — Other Ambulatory Visit: Payer: Self-pay

## 2019-09-15 DIAGNOSIS — Z20822 Contact with and (suspected) exposure to covid-19: Secondary | ICD-10-CM

## 2019-09-18 LAB — NOVEL CORONAVIRUS, NAA: SARS-CoV-2, NAA: NOT DETECTED

## 2019-10-06 ENCOUNTER — Ambulatory Visit: Payer: 59 | Admitting: Family Medicine

## 2019-10-06 ENCOUNTER — Ambulatory Visit (INDEPENDENT_AMBULATORY_CARE_PROVIDER_SITE_OTHER): Payer: 59

## 2019-10-06 ENCOUNTER — Other Ambulatory Visit: Payer: Self-pay

## 2019-10-06 ENCOUNTER — Encounter: Payer: Self-pay | Admitting: Family Medicine

## 2019-10-06 VITALS — BP 132/88 | HR 91 | Temp 98.7°F | Ht 62.0 in | Wt 190.8 lb

## 2019-10-06 DIAGNOSIS — M79644 Pain in right finger(s): Secondary | ICD-10-CM

## 2019-10-06 DIAGNOSIS — G8929 Other chronic pain: Secondary | ICD-10-CM | POA: Diagnosis not present

## 2019-10-06 DIAGNOSIS — M654 Radial styloid tenosynovitis [de Quervain]: Secondary | ICD-10-CM | POA: Diagnosis not present

## 2019-10-06 NOTE — Progress Notes (Signed)
12/11/20204:54 PM  April Robinson [birth year], 58 y.o., female [id number]  Chief Complaint  Patient presents with  . aches and pain in both hands    possible arthritis right thumb and left middle finger. Going on a few months and right hand not improved at all     HPI:   Patient is a 58 y.o. female with past medical history significant for HTN, GAD who presents today for right thumb pain  Patient is right handed Works IT Has had several months of pain at base of right thumb, worse when she bends it down No swelling, redness or warmth No trauma No tingling or numbness No weakness  Otherwise feels mood is overall stable This week was very stressful at work She does not want to adjust medication  Depression screen [facility] 2/9 10/06/2019 09/14/2019 06/23/2019  Decreased Interest 2 0 0  Down, Depressed, Hopeless 2 0 0  PHQ - 2 Score 4 0 0  Altered sleeping 2 - -  Tired, decreased energy 2 - -  Change in appetite 2 - -  Feeling bad or failure about yourself  2 - -  Trouble concentrating 0 - -  Moving slowly or fidgety/restless 0 - -  Suicidal thoughts 0 - -  PHQ-9 Score 12 - -  Difficult doing work/chores Somewhat difficult - -  Some recent data might be hidden   GAD 7 : Generalized Anxiety Score 10/06/2019 05/04/2019  Nervous, Anxious, on Edge 1 1  Control/stop worrying 1 1  Worry too much - different things 1 2  Trouble relaxing 2 1  Restless 0 0  Easily annoyed or irritable 0 1  Afraid - awful might happen 0 2  Total GAD 7 Score 5 8  Anxiety Difficulty Somewhat difficult Somewhat difficult     Fall Risk  10/06/2019 09/14/2019 06/23/2019 06/06/2019 05/04/2019  Falls in the past year? 0 0 0 0 1  Number falls in past yr: 0 0 0 0 1  Injury with Fall? 0 0 0 0 0  Follow up Falls evaluation completed - - - Falls evaluation completed     Allergies  Allergen Reactions  . Meloxicam Shortness Of Breath and Nausea Only  . Miconazole Nitrate Other (See Comments)    SWELLING  OF SOFT PALATE THAT CAUSED DIFFICULTY SWALLOWING  . Latex Itching, Rash and Other (See Comments)    Fine, little bumps    Prior to Admission medications   Medication Sig Start Date End Date Taking? Authorizing Provider  albuterol (PROVENTIL HFA;VENTOLIN HFA) 108 (90 Base) MCG/ACT inhaler Inhale 2 puffs into the lungs every 6 (six) hours as needed for wheezing or shortness of breath. 10/12/17  Yes Rutherford Guys, MD  amLODipine (NORVASC) 2.5 MG tablet TAKE 1 TABLET BY MOUTH EVERY DAY 09/11/19  Yes Rutherford Guys, MD  dexlansoprazole (DEXILANT) 60 MG capsule Take 1 capsule (60 mg total) by mouth daily. 03/17/19  Yes Rutherford Guys, MD  ibuprofen (ADVIL) 800 MG tablet Take 1 tablet (800 mg total) by mouth 3 (three) times daily as needed. 03/17/19  Yes Rutherford Guys, MD  Multiple Vitamin (MULTI-VITAMIN PO) Take by mouth daily.   Yes [provider]  Omega-3 Fatty Acids (FISH OIL PO) Take by mouth daily.   Yes [provider]  OVER THE COUNTER MEDICATION daily.   Yes [provider]  tamoxifen (NOLVADEX) 20 MG tablet TAKE 1 TABLET BY MOUTH EVERY DAY 12/05/18  Yes Magrinat, Virgie Dad, MD  valACYclovir (  VALTREX) 500 MG tablet TAKE ONE TAB TWICE DAILY FOR 3-5 DAYS WITH OUTBREAK. 03/17/19  Yes Rutherford Guys, MD  venlafaxine XR (EFFEXOR-XR) 37.5 MG 24 hr capsule Take 1 capsule (37.5 mg total) by mouth daily with breakfast. 05/10/19  Yes Magrinat, Virgie Dad, MD  hydrOXYzine (VISTARIL) 25 MG capsule Take 1 capsule (25 mg total) by mouth 3 (three) times daily as needed. Patient not taking: Reported on 10/06/2019 05/04/19   Rutherford Guys, MD    Past Medical History:  Diagnosis Date  . Alcoholism ([place])   . Arthritis   . Cancer ([place])    breast L  . Esophageal reflux   . Hiatal hernia   . Hx of radiation therapy 08/27/11 to 10/15/11   L breast  . Hyperlipemia   . Hypertension    past not on any medications currently  . Personal history of colonic polyps    TUBULAR  ADENOMA  . Personal history of radiation therapy     Past Surgical History:  Procedure Laterality Date  . ABDOMINAL HYSTERECTOMY     partial for DUB  . BREAST LUMPECTOMY  07/23/11   left breast  . COLONOSCOPY    . LAPAROSCOPIC HYSTERECTOMY  1995    Social History   Tobacco Use  . Smoking status: Former Smoker    Types: Cigarettes    Quit date: 1983    Years since quitting: 37.9  . Smokeless tobacco: Never Used  Substance Use Topics  . Alcohol use: Yes    Alcohol/week: 0.0 standard drinks    Comment: rare    Family History  Problem Relation Age of Onset  . Breast cancer Mother   . Cancer Mother        breast  . Hyperlipidemia Mother   . Diabetes Father   . Hypertension Father   . Cancer Father   . Hyperlipidemia Father   . Cancer Brother        NEUROENDOCRINE  . Hypertension Brother   . Arthritis Brother        hips  . Colon cancer Neg Hx     ROS Per hpi  OBJECTIVE:  Today's Vitals   10/06/19 1614 10/06/19 1658  BP: (!) 143/91 132/88  Pulse: 91   Temp: 98.7 F (37.1 C)   TempSrc: Temporal   SpO2: 96%   Weight: 190 lb 12.8 oz (86.5 kg)   Height: 5\' 2"  (1.575 m)    Body mass index is 34.9 kg/m.   Physical Exam Vitals and nursing note reviewed.  Constitutional:      Appearance: She is well-developed.  HENT:     Head: Normocephalic and atraumatic.  Eyes:     General: No scleral icterus.    Conjunctiva/sclera: Conjunctivae normal.     Pupils: Pupils are equal, round, and reactive to light.  Pulmonary:     Effort: Pulmonary effort is normal.  Musculoskeletal:     Right hand: Tenderness (at base of 1MTP, + finkelstein) present. No swelling or deformity. Normal range of motion. Normal strength. Normal sensation. Normal capillary refill.     Cervical back: Neck supple.  Skin:    General: Skin is warm and dry.  Neurological:     Mental Status: She is alert and oriented to person, place, and time.        No results found for this or any  previous visit (from the past 24 hour(s)).  DG Hand Complete Right  Result Date: 10/06/2019 CLINICAL DATA:  Hand pain EXAM:  RIGHT HAND - COMPLETE 3+ VIEW COMPARISON:  None. FINDINGS: No fracture or dislocation of the right hand. Joint spaces are well preserved. Soft tissues are unremarkable. IMPRESSION: No acute bony abnormality of the right hand. Electronically Signed   By: Eddie Candle M.D.   On: 10/06/2019 17:11     ASSESSMENT and PLAN  1. De Quervain's disease (tenosynovitis) Discussed RICE therapy, spica splint given today. Patient educational handout. Consider PT/ortho referral.  2. Chronic pain of right thumb - DG Hand Complete Right; Future  Return if symptoms worsen or fail to improve.    Rutherford Guys, MD Primary Care at [place], [place] [address] Ph.  [phone] Fax [phone]

## 2019-10-06 NOTE — Patient Instructions (Addendum)
If you have lab work done today you will be contacted with your lab results within the next 2 weeks.  If you have not heard from [place] then please contact us. The fastest way to get your results is to register for My Chart.   IF you received an x-ray today, you will receive an invoice from [facility]. Please contact [facility] at [phone] with questions or concerns regarding your invoice.   IF you received labwork today, you will receive an invoice from [facility]. Please contact [facility] at [phone] with questions or concerns regarding your invoice.   Our billing staff will not be able to assist you with questions regarding bills from these companies.  You will be contacted with the lab results as soon as they are available. The fastest way to get your results is to activate your My Chart account. Instructions are located on the last page of this paperwork. If you have not heard from [place] regarding the results in 2 weeks, please contact this office.      De Quervain's Tenosynovitis  De Quervain's tenosynovitis is a condition that causes inflammation of the tendon on the thumb side of the wrist. Tendons are cords of tissue that connect bones to muscles. The tendons in the hand pass through a tunnel called a sheath. A slippery layer of tissue (synovium) lets the tendons move smoothly in the sheath. With de Quervain's tenosynovitis, the sheath swells or thickens, causing friction and pain. The condition is also called de Quervain's disease and de Quervain's syndrome. It occurs most often in women who are 30-60 years old. What are the causes? The exact cause of this condition is not known. It may be associated with overuse of the hand and wrist. What increases the risk? You are more likely to develop this condition if you:  Use your hands far more than normal, especially if you repeat certain movements that involve twisting your hand or using a tight grip.  Are  pregnant.  Are a middle-aged woman.  Have rheumatoid arthritis.  Have diabetes. What are the signs or symptoms? The main symptom of this condition is pain on the thumb side of the wrist. The pain may get worse when you grasp something or turn your wrist. Other symptoms may include:  Pain that extends up the forearm.  Swelling of your wrist and hand.  Trouble moving the thumb and wrist.  A sensation of snapping in the wrist.  A bump filled with fluid (cyst) in the area of the pain. How is this diagnosed? This condition may be diagnosed based on:  Your symptoms and medical history.  A physical exam. During the exam, your health care provider may do a simple test Wynn Maudlin test) that involves pulling your thumb and wrist to see if this causes pain. You may also need to have an X-ray. How is this treated? Treatment for this condition may include:  Avoiding any activity that causes pain and swelling.  Taking medicines. Anti-inflammatory medicines and corticosteroid injections may be used to reduce inflammation and relieve pain.  Wearing a splint.  Having surgery. This may be needed if other treatments do not work. Once the pain and swelling has gone down:  Physical therapy. This includes stretching and strengthening exercises.  Occupational therapy. This includes adjusting how you move your wrist. Follow these instructions at home: If you have a splint:  Wear the splint as told by your health care provider. Remove it only as told by  your health care provider.  Loosen the splint if your fingers tingle, become numb, or turn cold and blue.  Keep the splint clean.  If the splint is not waterproof: ? Do not let it get wet. ? Cover it with a watertight covering when you take a bath or a shower. Managing pain, stiffness, and swelling   Avoid movements and activities that cause pain and swelling in the wrist area.  If directed, put ice on the painful area. This may be  helpful after doing activities that involve the sore wrist. ? Put ice in a plastic bag. ? Place a towel between your skin and the bag. ? Leave the ice on for 20 minutes, 2-3 times a day.  Move your fingers often to avoid stiffness and to lessen swelling.  Raise (elevate) the injured area above the level of your heart while you are sitting or lying down. General instructions  Return to your normal activities as told by your health care provider. Ask your health care provider what activities are safe for you.  Take over-the-counter and prescription medicines only as told by your health care provider.  Keep all follow-up visits as told by your health care provider. This is important. Contact a health care provider if:  Your pain medicine does not help.  Your pain gets worse.  You develop new symptoms. Summary  De Quervain's tenosynovitis is a condition that causes inflammation of the tendon on the thumb side of the wrist.  The condition occurs most often in women who are 13-30 years old.  The exact cause of this condition is not known. It may be associated with overuse of the hand and wrist.  Treatment starts with avoiding activity that causes pain or swelling in the wrist area. Other treatment may include wearing a splint and taking medicine. Sometimes, surgery is needed. This information is not intended to replace advice given to you by your health care provider. Make sure you discuss any questions you have with your health care provider. Document Released: 07/07/2001 Document Revised: 04/14/2018 Document Reviewed: 09/20/2017 Elsevier Patient Education  2020 [employer].

## 2019-10-10 ENCOUNTER — Other Ambulatory Visit: Payer: Self-pay | Admitting: Oncology

## 2019-11-07 ENCOUNTER — Other Ambulatory Visit: Payer: Self-pay

## 2019-11-07 ENCOUNTER — Ambulatory Visit: Payer: 59 | Attending: Internal Medicine

## 2019-11-07 ENCOUNTER — Telehealth: Payer: Self-pay | Admitting: Adult Health Nurse Practitioner

## 2019-11-07 ENCOUNTER — Telehealth (INDEPENDENT_AMBULATORY_CARE_PROVIDER_SITE_OTHER): Payer: 59 | Admitting: Adult Health Nurse Practitioner

## 2019-11-07 DIAGNOSIS — Z20822 Contact with and (suspected) exposure to covid-19: Secondary | ICD-10-CM | POA: Diagnosis not present

## 2019-11-07 MED ORDER — FLUTICASONE PROPIONATE 50 MCG/ACT NA SUSP: 2.0000 | Freq: Every day | NASAL | 6 refills | Status: DC

## 2019-11-07 NOTE — Progress Notes (Signed)
Telemedicine Encounter- SOAP NOTE Established Patient  This telephone encounter was conducted with the patient's (or proxy's) verbal consent via audio telecommunications: yes/no: Yes Patient was instructed to have this encounter in a suitably private space; and to only have persons present to whom they give permission to participate. In addition, patient identity was confirmed by use of name plus two identifiers (DOB and address).  I discussed the limitations, risks, security and privacy concerns of performing an evaluation and management service by telephone and the availability of in person appointments. I also discussed with the patient that there may be a patient responsible charge related to this service. The patient expressed understanding and agreed to proceed.  I spent a total of TIME; 0 MIN TO 60 MIN: 15 minutes talking with the patient or their proxy.  Chief Complaint  Patient presents with  . Cough    Subjective   April Robinson is a 59 y.o. established patient. Telephone visit today for URI symptoms   HPI Patient calls to complain of congestion, sneezing, eye running, and hoarse voice.  She reports initially that it is chronic but over the past week her symptoms have increased in severity and intensity.  She has developed an additional cough.  It does not keep her up at night but it does occur frequently during the day.  She did have some chills.  No known fever.  No loss of taste or smell.  Her employment sent her home.  Patient works in Engineer, technical sales and is finding that the more she talks the more hoarse or gets in the worse her cough is and she is unable to do her job without symptoms currently Patient Active Problem List   Diagnosis Date Noted  . Suspected COVID-19 virus infection 11/07/2019  . Cough 12/08/2018  . Tendonitis, Achilles, right 08/05/2017  . Left knee pain 04/23/2015  . Knee pain, left 04/11/2015  . Popliteal pain 04/11/2015  . Hypertension 10/17/2014  . Acute  pharyngitis 09/04/2014  . Left shoulder pain 02/19/2014  . Benign paroxysmal positional vertigo 09/29/2013  . Hot flashes due to tamoxifen 08/16/2013  . Hx of vaginitis 04/15/2012  . De Quervain's disease (tenosynovitis) 04/05/2012  . Malignant neoplasm of upper-outer quadrant of left breast in female, estrogen receptor positive ([place])   . IBS 12/03/2010  . ELEVATED BP READING WITHOUT DX HYPERTENSION 12/03/2010  . NECK SPRAIN AND STRAIN 12/03/2010  . ANXIETY STATE, UNSPECIFIED 11/07/2010  . ADD 11/07/2010  . GERD 11/07/2010  . CHEST PAIN UNSPECIFIED 11/07/2010    Past Medical History:  Diagnosis Date  . Alcoholism ([place])   . Arthritis   . Cancer ([place])    breast L  . Esophageal reflux   . Hiatal hernia   . Hx of radiation therapy 08/27/11 to 10/15/11   L breast  . Hyperlipemia   . Hypertension    past not on any medications currently  . Personal history of colonic polyps    TUBULAR ADENOMA  . Personal history of radiation therapy     Current Outpatient Medications  Medication Sig Dispense Refill  . albuterol (PROVENTIL HFA;VENTOLIN HFA) 108 (90 Base) MCG/ACT inhaler Inhale 2 puffs into the lungs every 6 (six) hours as needed for wheezing or shortness of breath. 1 Inhaler 0  . amLODipine (NORVASC) 2.5 MG tablet TAKE 1 TABLET BY MOUTH EVERY DAY 90 tablet 0  . dexlansoprazole (DEXILANT) 60 MG capsule Take 1 capsule (60 mg total) by mouth daily. 90 capsule 3  .  hydrOXYzine (VISTARIL) 25 MG capsule Take 1 capsule (25 mg total) by mouth 3 (three) times daily as needed. 30 capsule 0  . ibuprofen (ADVIL) 800 MG tablet Take 1 tablet (800 mg total) by mouth 3 (three) times daily as needed. 30 tablet 2  . Multiple Vitamin (MULTI-VITAMIN PO) Take by mouth daily.    . Omega-3 Fatty Acids (FISH OIL PO) Take by mouth daily.    Marland Kitchen OVER THE COUNTER MEDICATION daily.    . tamoxifen (NOLVADEX) 20 MG tablet TAKE 1 TABLET BY MOUTH EVERY DAY 90 tablet 1  . valACYclovir (VALTREX) 500 MG tablet TAKE  ONE TAB TWICE DAILY FOR 3-5 DAYS WITH OUTBREAK. 10 tablet 2  . venlafaxine XR (EFFEXOR-XR) 37.5 MG 24 hr capsule Take 1 capsule (37.5 mg total) by mouth daily with breakfast. 90 capsule 4  . fluticasone (FLONASE) 50 MCG/ACT nasal spray Place 2 sprays into both nostrils daily. 16 g 6   Current Facility-Administered Medications  Medication Dose Route Frequency Provider Last Rate Last Admin  . 0.9 %  sodium chloride infusion  500 mL Intravenous Continuous Pyrtle, Lajuan Lines, MD        Allergies  Allergen Reactions  . Meloxicam Shortness Of Breath and Nausea Only  . Miconazole Nitrate Other (See Comments)    SWELLING OF SOFT PALATE THAT CAUSED DIFFICULTY SWALLOWING  . Latex Itching, Rash and Other (See Comments)    Fine, little bumps    Social History   Socioeconomic History  . Marital status: Married    Spouse name: Nathaneil Canary  . Number of children: 3  . Years of education: 12+  . Highest education level: Not on file  Occupational History  . Occupation: Research scientist (physical sciences): [employer]    Employer: [employer]  Tobacco Use  . Smoking status: Former Smoker    Types: Cigarettes    Quit date: 1983    Years since quitting: 38.0  . Smokeless tobacco: Never Used  Substance and Sexual Activity  . Alcohol use: Yes    Alcohol/week: 0.0 standard drinks    Comment: rare  . Drug use: No  . Sexual activity: Yes    Partners: Male    Birth control/protection: Surgical    Comment: [name]  , SEXUAL PARTNERS MORE THAN 5  Other Topics Concern  . Not on file  Social History Narrative   Lives with her husband. Their children (her 3 and his one) live locally.   Social Determinants of Health   Financial Resource Strain:   . Difficulty of Paying Living Expenses: Not on file  Food Insecurity:   . Worried About Charity fundraiser in the Last Year: Not on file  . Ran Out of Food in the Last Year: Not on file  Transportation Needs:   . Lack of Transportation  (Medical): Not on file  . Lack of Transportation (Non-Medical): Not on file  Physical Activity:   . Days of Exercise per Week: Not on file  . Minutes of Exercise per Session: Not on file  Stress:   . Feeling of Stress : Not on file  Social Connections:   . Frequency of Communication with Friends and Family: Not on file  . Frequency of Social Gatherings with Friends and Family: Not on file  . Attends Religious Services: Not on file  . Active Member of Clubs or Organizations: Not on file  . Attends Archivist Meetings: Not on file  . Marital Status: Not on  file  Intimate Partner Violence:   . Fear of Current or Ex-Partner: Not on file  . Emotionally Abused: Not on file  . Physically Abused: Not on file  . Sexually Abused: Not on file    ROS   .  Positive for congestion, runny nose, chills, fatigue, cough, and use of an inhaler.  Negative for loss of taste or smell.  Negative for fever.  Negative for headaches.  Negative for nausea and vomiting.  Negative for chest pain or shortness of breath.  Objective    General appearance: oriented to person, place, and time. Mental Status: alert, oriented to person, place, and time.  Vitals as reported by the patient: There were no vitals filed for this visit.  Kamaya was seen today for cough.  Diagnoses and all orders for this visit:  Suspected COVID-19 virus infection -     Novel Coronavirus, NAA ([facility])  Other orders -     fluticasone (FLONASE) 50 MCG/ACT nasal spray; Place 2 sprays into both nostrils daily.  Will treat symptomatically initially until results of Covid test returned.  After that if she still has symptoms we discussed the recommendations to quarantine until symptoms are resolved she will start Flonase twice daily for nasal drainage.  Also, has been using her albuterol inhaler.  We will get a Covid test today and we will review after result.  She is in line with this plan.  I discussed the assessment and  treatment plan with the patient. The patient was provided an opportunity to ask questions and all were answered. The patient agreed with the plan and demonstrated an understanding of the instructions.   The patient was advised to call back or seek an in-person evaluation if the symptoms worsen or if the condition fails to improve as anticipated.  I provided 10 minutes of non-face-to-face time during this encounter.  Glyn Ade, NP  Primary Care at [facility]

## 2019-11-07 NOTE — Patient Instructions (Signed)
° ° ° °  If you have lab work done today you will be contacted with your lab results within the next 2 weeks.  If you have not heard from us then please contact us. The fastest way to get your results is to register for My Chart. ° ° °IF you received an x-ray today, you will receive an invoice from [facility]. Please contact [facility] at [phone] with questions or concerns regarding your invoice.  ° °IF you received labwork today, you will receive an invoice from [facility]. Please contact [facility] at [phone] with questions or concerns regarding your invoice.  ° °Our billing staff will not be able to assist you with questions regarding bills from these companies. ° °You will be contacted with the lab results as soon as they are available. The fastest way to get your results is to activate your My Chart account. Instructions are located on the last page of this paperwork. If you have not heard from us regarding the results in 2 weeks, please contact this office. °  ° ° ° °

## 2019-11-07 NOTE — Telephone Encounter (Signed)
Pt requesting a work note for during the time that her test is pending   She is requesting it through [employer]

## 2019-11-08 NOTE — Telephone Encounter (Signed)
I have called pt back and gathered more information. She has done the covid test on yesterday, so  I have sent in her covid testing letter to her my chart. Pt stated understanding.

## 2019-11-09 ENCOUNTER — Other Ambulatory Visit: Payer: Self-pay

## 2019-11-09 LAB — NOVEL CORONAVIRUS, NAA: SARS-CoV-2, NAA: NOT DETECTED

## 2019-11-10 ENCOUNTER — Telehealth: Payer: Self-pay | Admitting: Adult Health Nurse Practitioner

## 2019-11-10 NOTE — Telephone Encounter (Signed)
Pt was asked to call in and report to [facility] when she received her negative covid test so that [facility] may send her prescriptions that were discussed via telemed. Please assist. Pharmacy on file confirmed

## 2019-11-10 NOTE — Telephone Encounter (Signed)
Pt called again in regard to the antibiotic and steroid prescription. Please assist. Pt would like to pick up today

## 2019-11-10 NOTE — Telephone Encounter (Signed)
Please Advise

## 2019-11-12 ENCOUNTER — Other Ambulatory Visit: Payer: Self-pay

## 2019-11-12 ENCOUNTER — Encounter: Payer: Self-pay | Admitting: Emergency Medicine

## 2019-11-12 ENCOUNTER — Ambulatory Visit
Admission: EM | Admit: 2019-11-12 | Discharge: 2019-11-12 | Disposition: A | Discharge status: Home / Self Care | Payer: 59 | Attending: Emergency Medicine | Admitting: Emergency Medicine

## 2019-11-12 DIAGNOSIS — K529 Noninfective gastroenteritis and colitis, unspecified: Secondary | ICD-10-CM | POA: Diagnosis not present

## 2019-11-12 DIAGNOSIS — I10 Essential (primary) hypertension: Secondary | ICD-10-CM | POA: Diagnosis not present

## 2019-11-12 MED ORDER — ONDANSETRON HCL 4 MG PO TABS: 4.0000 mg | ORAL_TABLET | Freq: Four times a day (QID) | ORAL | 0 refills | Status: DC

## 2019-11-12 NOTE — ED Triage Notes (Addendum)
Pt presents to [facility] since 1030pm last night she began having nausea, diarrhea, stomach cramping, chills, weakness and fatigue.  Denies blood in stool.  Denies changes in urination.  Pt c/o upper abdominal cramping.  Patient states all symptoms have improved and slowed down.  Patient states 3 episodes of diarrhea, which she states messed up her clothes.  Pt states she had reflux last night as well, and now has scratchy throat and irritation.

## 2019-11-12 NOTE — ED Provider Notes (Signed)
[facility]    CSN: [id number] Arrival date & time: 11/12/19  0908      History   Chief Complaint Chief Complaint  Patient presents with  . Abdominal Pain    HPI April Robinson is a 59 y.o. female with history of hypertension, hiatal hernia, alcoholism presenting for nausea, vomiting, diarrhea, diffuse abdominal pain since last night.  Patient states that she developed some nausea around 1030 that was followed by a total of 3 episodes of emesis without bile/blood and 3 episodes of profuse diarrhea without hematochezia, melena, pain.  Patient has had one episode of diarrhea today: No nausea/emesis.  Patient not yet eaten today as she is not sure what is safe to eat.  Patient states that she ate a to go seafood combo dinner last night around 6: Oysters, scallops, whitefish.  States her husband ordered food from the same place, though got something different (flounder) and he has been asymptomatic.  Patient denies fever, bloating, chest pain, shortness of breath.  No recent alcohol intake.  Patient still has some upper abdominal cramping, sore/scratchy throat.  No difficulty swallowing/speaking.   Past Medical History:  Diagnosis Date  . Alcoholism ([place])   . Arthritis   . Cancer ([place])    breast L  . Esophageal reflux   . Hiatal hernia   . Hx of radiation therapy 08/27/11 to 10/15/11   L breast  . Hyperlipemia   . Hypertension    past not on any medications currently  . Personal history of colonic polyps    TUBULAR ADENOMA  . Personal history of radiation therapy     Patient Active Problem List   Diagnosis Date Noted  . Suspected COVID-19 virus infection 11/07/2019  . Cough 12/08/2018  . Tendonitis, Achilles, right 08/05/2017  . Left knee pain 04/23/2015  . Knee pain, left 04/11/2015  . Popliteal pain 04/11/2015  . Hypertension 10/17/2014  . Acute pharyngitis 09/04/2014  . Left shoulder pain 02/19/2014  . Benign paroxysmal positional vertigo 09/29/2013  .  Hot flashes due to tamoxifen 08/16/2013  . Hx of vaginitis 04/15/2012  . De Quervain's disease (tenosynovitis) 04/05/2012  . Malignant neoplasm of upper-outer quadrant of left breast in female, estrogen receptor positive ([place])   . IBS 12/03/2010  . ELEVATED BP READING WITHOUT DX HYPERTENSION 12/03/2010  . NECK SPRAIN AND STRAIN 12/03/2010  . ANXIETY STATE, UNSPECIFIED 11/07/2010  . ADD 11/07/2010  . GERD 11/07/2010  . CHEST PAIN UNSPECIFIED 11/07/2010    Past Surgical History:  Procedure Laterality Date  . ABDOMINAL HYSTERECTOMY     partial for DUB  . BREAST LUMPECTOMY  07/23/11   left breast  . COLONOSCOPY    . LAPAROSCOPIC HYSTERECTOMY  1995    OB History    Gravida  3   Para  3   Term      Preterm      AB      Living  3     SAB      TAB      Ectopic      Multiple      Live Births               Home Medications    Prior to Admission medications   Medication Sig Start Date End Date Taking? Authorizing Provider  albuterol (PROVENTIL HFA;VENTOLIN HFA) 108 (90 Base) MCG/ACT inhaler Inhale 2 puffs into the lungs every 6 (six) hours as needed for wheezing or shortness of breath. 10/12/17  Rutherford Guys, MD  amLODipine (NORVASC) 2.5 MG tablet TAKE 1 TABLET BY MOUTH EVERY DAY 09/11/19   Rutherford Guys, MD  dexlansoprazole (DEXILANT) 60 MG capsule Take 1 capsule (60 mg total) by mouth daily. 03/17/19   Rutherford Guys, MD  fluticasone (FLONASE) 50 MCG/ACT nasal spray Place 2 sprays into both nostrils daily. 11/07/19   Wendall Mola, NP  hydrOXYzine (VISTARIL) 25 MG capsule Take 1 capsule (25 mg total) by mouth 3 (three) times daily as needed. 05/04/19   Rutherford Guys, MD  ibuprofen (ADVIL) 800 MG tablet Take 1 tablet (800 mg total) by mouth 3 (three) times daily as needed. 03/17/19   Rutherford Guys, MD  Multiple Vitamin (MULTI-VITAMIN PO) Take by mouth daily.    [provider]  Omega-3 Fatty Acids (FISH OIL PO) Take by mouth daily.     [provider]  ondansetron (ZOFRAN) 4 MG tablet Take 1 tablet (4 mg total) by mouth every 6 (six) hours. 11/12/19   Hall-Potvin, [place], PA-C  OVER THE COUNTER MEDICATION daily.    [provider]  tamoxifen (NOLVADEX) 20 MG tablet TAKE 1 TABLET BY MOUTH EVERY DAY 10/10/19   Magrinat, Virgie Dad, MD  valACYclovir (VALTREX) 500 MG tablet TAKE ONE TAB TWICE DAILY FOR 3-5 DAYS WITH OUTBREAK. 03/17/19   Rutherford Guys, MD  venlafaxine XR (EFFEXOR-XR) 37.5 MG 24 hr capsule Take 1 capsule (37.5 mg total) by mouth daily with breakfast. 05/10/19   Magrinat, Virgie Dad, MD    Family History Family History  Problem Relation Age of Onset  . Breast cancer Mother   . Cancer Mother        breast  . Hyperlipidemia Mother   . Diabetes Father   . Hypertension Father   . Cancer Father   . Hyperlipidemia Father   . Cancer Brother        NEUROENDOCRINE  . Hypertension Brother   . Arthritis Brother        hips  . Colon cancer Neg Hx     Social History Social History   Tobacco Use  . Smoking status: Former Smoker    Types: Cigarettes    Quit date: 1983    Years since quitting: 38.0  . Smokeless tobacco: Never Used  Substance Use Topics  . Alcohol use: Yes    Alcohol/week: 0.0 standard drinks    Comment: rare  . Drug use: No     Allergies   Meloxicam, Miconazole nitrate, and Latex   Review of Systems As per HPI   Physical Exam Triage Vital Signs ED Triage Vitals  Enc Vitals Group     BP      Pulse      Resp      Temp      Temp src      SpO2      Weight      Height      Head Circumference      Peak Flow      Pain Score      Pain Loc      Pain Edu?      Excl. in [place]?    No data found.  Updated Vital Signs BP (!) 138/93 (BP Location: Left Arm)   Pulse 81 Comment: Taken by APP during assessment  Temp 98.2 F (36.8 C) (Temporal)   Resp 18   SpO2 98%   Visual Acuity Right Eye Distance:   Left Eye Distance:  Bilateral Distance:    Right Eye  Near:   Left Eye Near:    Bilateral Near:     Physical Exam Constitutional:      General: She is not in acute distress.    Appearance: She is well-developed. She is obese. She is not toxic-appearing or diaphoretic.  HENT:     Head: Normocephalic and atraumatic.     Mouth/Throat:     Mouth: Mucous membranes are moist.     Pharynx: Oropharynx is clear. No pharyngeal swelling or oropharyngeal exudate.     Comments: Mild erythema Eyes:     General: No scleral icterus.    Pupils: Pupils are equal, round, and reactive to light.  Cardiovascular:     Rate and Rhythm: Normal rate and regular rhythm.  Pulmonary:     Effort: Pulmonary effort is normal. No respiratory distress.     Breath sounds: No wheezing.  Abdominal:     General: Bowel sounds are normal. There is no distension or abdominal bruit.     Palpations: Abdomen is soft. There is no hepatomegaly, splenomegaly, mass or pulsatile mass.     Tenderness: There is generalized abdominal tenderness and tenderness in the right upper quadrant, epigastric area and left upper quadrant. There is no right CVA tenderness, left CVA tenderness, guarding or rebound. Negative signs include Murphy's sign, Rovsing's sign and McBurney's sign.     Hernia: No hernia is present.  Skin:    General: Skin is warm.     Capillary Refill: Capillary refill takes less than 2 seconds.     Coloration: Skin is not cyanotic, jaundiced, mottled or pale.  Neurological:     General: No focal deficit present.     Mental Status: She is alert and oriented to person, place, and time.      UC Treatments / Results  Labs (all labs ordered are listed, but only abnormal results are displayed) Labs Reviewed - No data to display  EKG   Radiology No results found.  Procedures Procedures (including critical care time)  Medications Ordered in UC Medications - No data to display  Initial Impression / Assessment and Plan / UC Course  I have reviewed the triage vital  signs and the nursing notes.  Pertinent labs & imaging results that were available during my care of the patient were reviewed by me and considered in my medical decision making (see chart for details).     Patient afebrile, nontoxic, appears to be well-hydrated.  Patient was able to keep down a full glass of water without nausea, emesis.  Reviewed that this is likely gastroenteritis second to mild to moderate food poisoning.  Low concern for pancreatitis, cholecystitis, colitis.  Expect this to resolve over the next 24-48 hours: Will provide Zofran as needed for nausea.  Return precautions discussed, patient verbalized understanding and is agreeable to plan. Final Clinical Impressions(s) / UC Diagnoses   Final diagnoses:  Gastroenteritis, acute     Discharge Instructions     Eat soft foods/liquids/bland diet. Take zofran as needed. Important to drink water throughout. Return for worsening symptoms, fever, blood in vomit/diarrhea.    ED Prescriptions    Medication Sig Dispense Auth. Provider   ondansetron (ZOFRAN) 4 MG tablet Take 1 tablet (4 mg total) by mouth every 6 (six) hours. 12 tablet Hall-Potvin, [place], PA-C     PDMP not reviewed this encounter.   Hall-Potvin, [place], [place] 11/12/19 1054

## 2019-11-12 NOTE — Discharge Instructions (Addendum)
Eat soft foods/liquids/bland diet. Take zofran as needed. Important to drink water throughout. Return for worsening symptoms, fever, blood in vomit/diarrhea.

## 2019-11-16 NOTE — Telephone Encounter (Signed)
Please f/u with patient regarding symptoms.

## 2019-11-16 NOTE — Telephone Encounter (Signed)
LVM for pt to cb to the office in regards to jher concerns regarding a medication.

## 2019-11-17 NOTE — Telephone Encounter (Signed)
LVM for pt to cb with her concerns to wanting an antibiotic.

## 2019-11-21 ENCOUNTER — Other Ambulatory Visit: Payer: Self-pay

## 2019-11-21 ENCOUNTER — Telehealth (INDEPENDENT_AMBULATORY_CARE_PROVIDER_SITE_OTHER): Payer: 59 | Admitting: Registered Nurse

## 2019-11-21 DIAGNOSIS — J069 Acute upper respiratory infection, unspecified: Secondary | ICD-10-CM

## 2019-11-21 MED ORDER — BENZONATATE 200 MG PO CAPS: 200.0000 mg | ORAL_CAPSULE | Freq: Two times a day (BID) | ORAL | 0 refills | Status: DC | PRN

## 2019-11-21 MED ORDER — AMOXICILLIN 875 MG PO TABS: 875.0000 mg | ORAL_TABLET | Freq: Two times a day (BID) | ORAL | 0 refills | Status: DC

## 2019-11-21 MED ORDER — GUAIFENESIN-DM 100-10 MG/5ML PO SYRP: 5.0000 mL | ORAL_SOLUTION | ORAL | 0 refills | Status: DC | PRN

## 2019-11-21 MED ORDER — OXYMETAZOLINE HCL 0.05 % NA SOLN: 1.0000 | Freq: Two times a day (BID) | NASAL | 0 refills | Status: DC

## 2019-11-21 MED ORDER — PROMETHAZINE-CODEINE 6.25-10 MG/5ML PO SOLN: 5.0000 mL | Freq: Every day | ORAL | 0 refills | Status: DC

## 2019-11-21 NOTE — Progress Notes (Signed)
Telemedicine Encounter- SOAP NOTE Established Patient  This telephone encounter was conducted with the patient's (or proxy's) verbal consent via audio telecommunications: yes  Patient was instructed to have this encounter in a suitably private space; and to only have persons present to whom they give permission to participate. In addition, patient identity was confirmed by use of name plus two identifiers (DOB and address).  I discussed the limitations, risks, security and privacy concerns of performing an evaluation and management service by telephone and the availability of in person appointments. I also discussed with the patient that there may be a patient responsible charge related to this service. The patient expressed understanding and agreed to proceed.  I spent a total of 16 minutes talking with the patient or their proxy.  No chief complaint on file.   Subjective   April Robinson is a 59 y.o. established patient. Telephone visit today for ongoing URI symptoms  HPI Was seen by Jens Som NP two weeks ago - suspected COVID or other viral infection. Supportive care discussed.  However, tested negative for COVID, symptoms worsening over previous weeks. Sinus drainage, PND, consistent cough, headache, ear pressure/pain. Cough has been keeping her up at night, keeping her from working, and interrupts any conversation she tries to have. Feels it is definitely PND/tickle in throat, not chest congestion. Scantly productive with clear sputum.  Patient Active Problem List   Diagnosis Date Noted  . Suspected COVID-19 virus infection 11/07/2019  . Cough 12/08/2018  . Tendonitis, Achilles, right 08/05/2017  . Left knee pain 04/23/2015  . Knee pain, left 04/11/2015  . Popliteal pain 04/11/2015  . Hypertension 10/17/2014  . Acute pharyngitis 09/04/2014  . Left shoulder pain 02/19/2014  . Benign paroxysmal positional vertigo 09/29/2013  . Hot flashes due to tamoxifen 08/16/2013  . Hx  of vaginitis 04/15/2012  . De Quervain's disease (tenosynovitis) 04/05/2012  . Malignant neoplasm of upper-outer quadrant of left breast in female, estrogen receptor positive ([place])   . IBS 12/03/2010  . ELEVATED BP READING WITHOUT DX HYPERTENSION 12/03/2010  . NECK SPRAIN AND STRAIN 12/03/2010  . ANXIETY STATE, UNSPECIFIED 11/07/2010  . ADD 11/07/2010  . GERD 11/07/2010  . CHEST PAIN UNSPECIFIED 11/07/2010    Past Medical History:  Diagnosis Date  . Alcoholism ([place])   . Arthritis   . Cancer ([place])    breast L  . Esophageal reflux   . Hiatal hernia   . Hx of radiation therapy 08/27/11 to 10/15/11   L breast  . Hyperlipemia   . Hypertension    past not on any medications currently  . Personal history of colonic polyps    TUBULAR ADENOMA  . Personal history of radiation therapy     Current Outpatient Medications  Medication Sig Dispense Refill  . amLODipine (NORVASC) 2.5 MG tablet TAKE 1 TABLET BY MOUTH EVERY DAY 90 tablet 0  . fluticasone (FLONASE) 50 MCG/ACT nasal spray Place 2 sprays into both nostrils daily. 16 g 6  . hydrOXYzine (VISTARIL) 25 MG capsule Take 1 capsule (25 mg total) by mouth 3 (three) times daily as needed. 30 capsule 0  . ibuprofen (ADVIL) 800 MG tablet Take 1 tablet (800 mg total) by mouth 3 (three) times daily as needed. 30 tablet 2  . Multiple Vitamin (MULTI-VITAMIN PO) Take by mouth daily.    . Omega-3 Fatty Acids (FISH OIL PO) Take by mouth daily.    Marland Kitchen OVER THE COUNTER MEDICATION daily.    . tamoxifen (NOLVADEX)  20 MG tablet TAKE 1 TABLET BY MOUTH EVERY DAY 90 tablet 1  . valACYclovir (VALTREX) 500 MG tablet TAKE ONE TAB TWICE DAILY FOR 3-5 DAYS WITH OUTBREAK. 10 tablet 2  . venlafaxine XR (EFFEXOR-XR) 37.5 MG 24 hr capsule Take 1 capsule (37.5 mg total) by mouth daily with breakfast. 90 capsule 4  . albuterol (PROVENTIL HFA;VENTOLIN HFA) 108 (90 Base) MCG/ACT inhaler Inhale 2 puffs into the lungs every 6 (six) hours as needed for wheezing or shortness  of breath. (Patient not taking: Reported on 11/21/2019) 1 Inhaler 0  . amoxicillin (AMOXIL) 875 MG tablet Take 1 tablet (875 mg total) by mouth 2 (two) times daily. 20 tablet 0  . benzonatate (TESSALON) 200 MG capsule Take 1 capsule (200 mg total) by mouth 2 (two) times daily as needed for cough. 20 capsule 0  . dexlansoprazole (DEXILANT) 60 MG capsule Take 1 capsule (60 mg total) by mouth daily. 90 capsule 3  . guaiFENesin-dextromethorphan (ROBITUSSIN DM) 100-10 MG/5ML syrup Take 5 mLs by mouth every 4 (four) hours as needed for cough. 118 mL 0  . ondansetron (ZOFRAN) 4 MG tablet Take 1 tablet (4 mg total) by mouth every 6 (six) hours. (Patient not taking: Reported on 11/21/2019) 12 tablet 0  . oxymetazoline (AFRIN) 0.05 % nasal spray Place 1 spray into both nostrils 2 (two) times daily. 30 mL 0  . Promethazine-Codeine 6.25-10 MG/5ML SOLN Take 5 mLs by mouth at bedtime. 280 mL 0   Current Facility-Administered Medications  Medication Dose Route Frequency Provider Last Rate Last Admin  . 0.9 %  sodium chloride infusion  500 mL Intravenous Continuous Pyrtle, Lajuan Lines, MD        Allergies  Allergen Reactions  . Meloxicam Shortness Of Breath and Nausea Only  . Miconazole Nitrate Other (See Comments)    SWELLING OF SOFT PALATE THAT CAUSED DIFFICULTY SWALLOWING  . Latex Itching, Rash and Other (See Comments)    Fine, little bumps    Social History   Socioeconomic History  . Marital status: Married    Spouse name: Nathaneil Canary  . Number of children: 3  . Years of education: 12+  . Highest education level: Not on file  Occupational History  . Occupation: Research scientist (physical sciences): [employer]    Employer: [employer]  Tobacco Use  . Smoking status: Former Smoker    Types: Cigarettes    Quit date: 1983    Years since quitting: 38.0  . Smokeless tobacco: Never Used  Substance and Sexual Activity  . Alcohol use: Yes    Alcohol/week: 0.0 standard drinks    Comment: rare  . Drug use:  No  . Sexual activity: Yes    Partners: Male    Birth control/protection: Surgical    Comment: [name]  , SEXUAL PARTNERS MORE THAN 5  Other Topics Concern  . Not on file  Social History Narrative   Lives with her husband. Their children (her 3 and his one) live locally.   Social Determinants of Health   Financial Resource Strain:   . Difficulty of Paying Living Expenses: Not on file  Food Insecurity:   . Worried About Charity fundraiser in the Last Year: Not on file  . Ran Out of Food in the Last Year: Not on file  Transportation Needs:   . Lack of Transportation (Medical): Not on file  . Lack of Transportation (Non-Medical): Not on file  Physical Activity:   . Days of Exercise  per Week: Not on file  . Minutes of Exercise per Session: Not on file  Stress:   . Feeling of Stress : Not on file  Social Connections:   . Frequency of Communication with Friends and Family: Not on file  . Frequency of Social Gatherings with Friends and Family: Not on file  . Attends Religious Services: Not on file  . Active Member of Clubs or Organizations: Not on file  . Attends Archivist Meetings: Not on file  . Marital Status: Not on file  Intimate Partner Violence:   . Fear of Current or Ex-Partner: Not on file  . Emotionally Abused: Not on file  . Physically Abused: Not on file  . Sexually Abused: Not on file    ROS Per hpi Others negative on 12 point ROS  Objective   Vitals as reported by the patient: There were no vitals filed for this visit.  Diagnoses and all orders for this visit:  Acute upper respiratory infection -     amoxicillin (AMOXIL) 875 MG tablet; Take 1 tablet (875 mg total) by mouth 2 (two) times daily. -     guaiFENesin-dextromethorphan (ROBITUSSIN DM) 100-10 MG/5ML syrup; Take 5 mLs by mouth every 4 (four) hours as needed for cough. -     Promethazine-Codeine 6.25-10 MG/5ML SOLN; Take 5 mLs by mouth at bedtime. -     benzonatate (TESSALON)  200 MG capsule; Take 1 capsule (200 mg total) by mouth 2 (two) times daily as needed for cough. -     oxymetazoline (AFRIN) 0.05 % nasal spray; Place 1 spray into both nostrils 2 (two) times daily.   PLAN  Given length of illness and worsening symptoms, will give ten days of amoxicillin BID.  Supportive care: dextromethorphan-guiafenesin, promethazine-codeine, tessalon, afrin PRN, continue fluticasone PRN  Given instructions regarding follow up - if symptoms worsen or fail to improve  Work note provided  Patient encouraged to call clinic with any questions, comments, or concerns.   I discussed the assessment and treatment plan with the patient. The patient was provided an opportunity to ask questions and all were answered. The patient agreed with the plan and demonstrated an understanding of the instructions.   The patient was advised to call back or seek an in-person evaluation if the symptoms worsen or if the condition fails to improve as anticipated.  I provided 16 minutes of non-face-to-face time during this encounter.  Maximiano Coss, NP  Primary Care at [facility]

## 2019-11-21 NOTE — Progress Notes (Signed)
Patient states she has a sinus drainage , ears hurting, coughing, sneezing , and no fever. Patient been taking OTC allergy medication ,  feels like the flonase make the drainage worst and also talking. Patient has been tested for COVID and was negative.

## 2019-11-21 NOTE — Patient Instructions (Signed)
° ° ° °  If you have lab work done today you will be contacted with your lab results within the next 2 weeks.  If you have not heard from us then please contact us. The fastest way to get your results is to register for My Chart. ° ° °IF you received an x-ray today, you will receive an invoice from [facility]. Please contact  Radiology at [phone] with questions or concerns regarding your invoice.  ° °IF you received labwork today, you will receive an invoice from [facility]. Please contact [facility] at [phone] with questions or concerns regarding your invoice.  ° °Our billing staff will not be able to assist you with questions regarding bills from these companies. ° °You will be contacted with the lab results as soon as they are available. The fastest way to get your results is to activate your My Chart account. Instructions are located on the last page of this paperwork. If you have not heard from us regarding the results in 2 weeks, please contact this office. °  ° ° ° °

## 2019-11-24 ENCOUNTER — Telehealth: Payer: Self-pay | Admitting: Nurse Practitioner

## 2019-11-24 ENCOUNTER — Telehealth: Payer: Self-pay | Admitting: Family Medicine

## 2019-11-24 ENCOUNTER — Ambulatory Visit
Admission: EM | Admit: 2019-11-24 | Discharge: 2019-11-24 | Disposition: A | Discharge status: Home / Self Care | Payer: 59 | Attending: Emergency Medicine | Admitting: Emergency Medicine

## 2019-11-24 DIAGNOSIS — U071 COVID-19: Secondary | ICD-10-CM | POA: Diagnosis not present

## 2019-11-24 LAB — POC SARS CORONAVIRUS 2 AG -  ED: SARS Coronavirus 2 Ag: POSITIVE — AB

## 2019-11-24 MED ORDER — ONDANSETRON HCL 4 MG PO TABS: 4.0000 mg | ORAL_TABLET | Freq: Four times a day (QID) | ORAL | 0 refills | Status: DC

## 2019-11-24 NOTE — Telephone Encounter (Signed)
Called to discuss with April Robinson about Covid symptoms and the use of bamlanivimab, a monoclonal antibody infusion for those with mild to moderate Covid symptoms and at a high risk of hospitalization.     Pt is qualified for this infusion at the [facility] infusion center due to co-morbid conditions and/or a member of an at-risk group.  Unable to reach patient. HIPPA approved voicemail left. Also sent patient a message via [place].    Patient Active Problem List   Diagnosis Date Noted  . Suspected COVID-19 virus infection 11/07/2019  . Cough 12/08/2018  . Tendonitis, Achilles, right 08/05/2017  . Left knee pain 04/23/2015  . Knee pain, left 04/11/2015  . Popliteal pain 04/11/2015  . Hypertension 10/17/2014  . Acute pharyngitis 09/04/2014  . Left shoulder pain 02/19/2014  . Benign paroxysmal positional vertigo 09/29/2013  . Hot flashes due to tamoxifen 08/16/2013  . Hx of vaginitis 04/15/2012  . De Quervain's disease (tenosynovitis) 04/05/2012  . Malignant neoplasm of upper-outer quadrant of left breast in female, estrogen receptor positive ([place])   . IBS 12/03/2010  . ELEVATED BP READING WITHOUT DX HYPERTENSION 12/03/2010  . NECK SPRAIN AND STRAIN 12/03/2010  . ANXIETY STATE, UNSPECIFIED 11/07/2010  . ADD 11/07/2010  . GERD 11/07/2010  . CHEST PAIN UNSPECIFIED 11/07/2010    Alda Lea, AGPCNP-BC Pager: [phone] Amion: Bjorn Pippin

## 2019-11-24 NOTE — Telephone Encounter (Signed)
Copied from [place] [phone]. Topic: General - Other >> Nov 24, 2019 11:10 AM Rainey Pines A wrote: Patient called to inform Dr. Pamella Pert that she has tested positive for covid and would like a callback from her nurse on what she would further advise. Please advise

## 2019-11-24 NOTE — Telephone Encounter (Signed)
Patient's concern/request has been addressed already by another provider or staff member. 

## 2019-11-24 NOTE — ED Notes (Signed)
Pt took tylenol 500mg  x2 of her own meds in triage.

## 2019-11-24 NOTE — Discharge Instructions (Signed)
It is very important to remember that since you have tested positive for Covid you need to be staying home and quarantining to help keep others safe and healthy in the community.  If you would like further evaluation for persistent or worsening symptoms, you may schedule an E-visit or virtual (video) visit throughout the [facility] app or website.  PLEASE NOTE: If you develop severe chest pain or shortness of breath please go to the ER or call 9-1-1 for further evaluation --> DO NOT schedule electronic or virtual visits for this. Please call our office for further guidance / recommendations as needed.  For information about the Covid vaccine, please visit [email or web address]

## 2019-11-24 NOTE — ED Provider Notes (Signed)
[facility]    CSN: [id number] Arrival date & time: 11/24/19  0816      History   Chief Complaint Chief Complaint  Patient presents with  . Fever    HPI April Robinson is a 58 y.o. female with history of hypertension, GERD presenting for cough, fatigue, intermittent nausea for the last 2 weeks.  Patient states she was previously evaluated via telemedicine: Given amoxicillin, Flonase, cough syrup with codeine, Tylenol.  Patient has been taking amoxicillin since Wednesday twice daily as instructed and thinks this is helping "maybe a little".  For the last week patient has had increased sinus irritation without pressure, pain, dental pain.  Patient states she had a fever of 101.21F this morning at 6 AM; has not yet taken anything for fever.  Patient feels her breathing overall is okay: Denies chest pain, arthralgias, myalgias, lower extremity edema, claudication, history of DVT.  Patient does note nausea that was worse yesterday from cough.  No emesis, abdominal pain, change in bowel or bladder habit.   Past Medical History:  Diagnosis Date  . Alcoholism ([place])   . Arthritis   . Cancer ([place])    breast L  . Esophageal reflux   . Hiatal hernia   . Hx of radiation therapy 08/27/11 to 10/15/11   L breast  . Hyperlipemia   . Hypertension    past not on any medications currently  . Personal history of colonic polyps    TUBULAR ADENOMA  . Personal history of radiation therapy     Patient Active Problem List   Diagnosis Date Noted  . Suspected COVID-19 virus infection 11/07/2019  . Cough 12/08/2018  . Tendonitis, Achilles, right 08/05/2017  . Left knee pain 04/23/2015  . Knee pain, left 04/11/2015  . Popliteal pain 04/11/2015  . Hypertension 10/17/2014  . Acute pharyngitis 09/04/2014  . Left shoulder pain 02/19/2014  . Benign paroxysmal positional vertigo 09/29/2013  . Hot flashes due to tamoxifen 08/16/2013  . Hx of vaginitis 04/15/2012  . De Quervain's disease  (tenosynovitis) 04/05/2012  . Malignant neoplasm of upper-outer quadrant of left breast in female, estrogen receptor positive ([place])   . IBS 12/03/2010  . ELEVATED BP READING WITHOUT DX HYPERTENSION 12/03/2010  . NECK SPRAIN AND STRAIN 12/03/2010  . ANXIETY STATE, UNSPECIFIED 11/07/2010  . ADD 11/07/2010  . GERD 11/07/2010  . CHEST PAIN UNSPECIFIED 11/07/2010    Past Surgical History:  Procedure Laterality Date  . ABDOMINAL HYSTERECTOMY     partial for DUB  . BREAST LUMPECTOMY  07/23/11   left breast  . COLONOSCOPY    . LAPAROSCOPIC HYSTERECTOMY  1995    OB History    Gravida  3   Para  3   Term      Preterm      AB      Living  3     SAB      TAB      Ectopic      Multiple      Live Births               Home Medications    Prior to Admission medications   Medication Sig Start Date End Date Taking? Authorizing Provider  benzonatate (TESSALON) 200 MG capsule Take 1 capsule (200 mg total) by mouth 2 (two) times daily as needed for cough. 11/21/19  Yes Maximiano Coss, NP  albuterol (PROVENTIL HFA;VENTOLIN HFA) 108 (90 Base) MCG/ACT inhaler Inhale 2 puffs into the lungs every  6 (six) hours as needed for wheezing or shortness of breath. Patient not taking: Reported on 11/21/2019 10/12/17   Rutherford Guys, MD  amLODipine (NORVASC) 2.5 MG tablet TAKE 1 TABLET BY MOUTH EVERY DAY 09/11/19   Rutherford Guys, MD  amoxicillin (AMOXIL) 875 MG tablet Take 1 tablet (875 mg total) by mouth 2 (two) times daily. 11/21/19   Maximiano Coss, NP  dexlansoprazole (DEXILANT) 60 MG capsule Take 1 capsule (60 mg total) by mouth daily. 03/17/19   Rutherford Guys, MD  fluticasone (FLONASE) 50 MCG/ACT nasal spray Place 2 sprays into both nostrils daily. 11/07/19   Wendall Mola, NP  guaiFENesin-dextromethorphan (ROBITUSSIN DM) 100-10 MG/5ML syrup Take 5 mLs by mouth every 4 (four) hours as needed for cough. 11/21/19   Maximiano Coss, NP  hydrOXYzine (VISTARIL) 25 MG capsule  Take 1 capsule (25 mg total) by mouth 3 (three) times daily as needed. 05/04/19   Rutherford Guys, MD  ibuprofen (ADVIL) 800 MG tablet Take 1 tablet (800 mg total) by mouth 3 (three) times daily as needed. 03/17/19   Rutherford Guys, MD  Multiple Vitamin (MULTI-VITAMIN PO) Take by mouth daily.    [provider]  Omega-3 Fatty Acids (FISH OIL PO) Take by mouth daily.    [provider]  ondansetron (ZOFRAN) 4 MG tablet Take 1 tablet (4 mg total) by mouth every 6 (six) hours. 11/24/19   Hall-Potvin, [place], PA-C  OVER THE COUNTER MEDICATION daily.    [provider]  oxymetazoline (AFRIN) 0.05 % nasal spray Place 1 spray into both nostrils 2 (two) times daily. 11/21/19   Maximiano Coss, NP  Promethazine-Codeine 6.25-10 MG/5ML SOLN Take 5 mLs by mouth at bedtime. 11/21/19   Maximiano Coss, NP  tamoxifen (NOLVADEX) 20 MG tablet TAKE 1 TABLET BY MOUTH EVERY DAY 10/10/19   Magrinat, Virgie Dad, MD  valACYclovir (VALTREX) 500 MG tablet TAKE ONE TAB TWICE DAILY FOR 3-5 DAYS WITH OUTBREAK. 03/17/19   Rutherford Guys, MD  venlafaxine XR (EFFEXOR-XR) 37.5 MG 24 hr capsule Take 1 capsule (37.5 mg total) by mouth daily with breakfast. 05/10/19   Magrinat, Virgie Dad, MD    Family History Family History  Problem Relation Age of Onset  . Breast cancer Mother   . Cancer Mother        breast  . Hyperlipidemia Mother   . Diabetes Father   . Hypertension Father   . Cancer Father   . Hyperlipidemia Father   . Cancer Brother        NEUROENDOCRINE  . Hypertension Brother   . Arthritis Brother        hips  . Colon cancer Neg Hx     Social History Social History   Tobacco Use  . Smoking status: Former Smoker    Types: Cigarettes    Quit date: 1983    Years since quitting: 38.1  . Smokeless tobacco: Never Used  Substance Use Topics  . Alcohol use: Yes    Alcohol/week: 0.0 standard drinks    Comment: rare  . Drug use: No     Allergies   Meloxicam, Miconazole nitrate,  and Latex   Review of Systems As per HPI   Physical Exam Triage Vital Signs ED Triage Vitals  Enc Vitals Group     BP      Pulse      Resp      Temp      Temp src  SpO2      Weight      Height      Head Circumference      Peak Flow      Pain Score      Pain Loc      Pain Edu?      Excl. in [place]?    No data found.  Updated Vital Signs BP (!) 137/91 (BP Location: Left Arm)   Pulse (!) 123   Temp (!) 102.4 F (39.1 C) (Oral)   Resp 18   SpO2 94%   Visual Acuity Right Eye Distance:   Left Eye Distance:   Bilateral Distance:    Right Eye Near:   Left Eye Near:    Bilateral Near:     Physical Exam Constitutional:      General: She is not in acute distress.    Appearance: She is obese. She is ill-appearing. She is not toxic-appearing or diaphoretic.  HENT:     Head: Normocephalic and atraumatic.     Right Ear: Tympanic membrane, ear canal and external ear normal.     Left Ear: Tympanic membrane, ear canal and external ear normal.     Nose: Nose normal.     Mouth/Throat:     Mouth: Mucous membranes are moist.     Pharynx: Oropharynx is clear. No oropharyngeal exudate or posterior oropharyngeal erythema.  Eyes:     General: No scleral icterus.    Conjunctiva/sclera: Conjunctivae normal.     Pupils: Pupils are equal, round, and reactive to light.  Neck:     Comments: Trachea midline, negative JVD Cardiovascular:     Rate and Rhythm: Normal rate and regular rhythm.     Heart sounds: No murmur. No gallop.   Pulmonary:     Effort: Pulmonary effort is normal. No respiratory distress.     Breath sounds: No wheezing, rhonchi or rales.  Abdominal:     General: Bowel sounds are normal.     Tenderness: There is no abdominal tenderness.  Musculoskeletal:     Cervical back: Neck supple. No tenderness.     Right lower leg: No edema.     Left lower leg: No edema.  Lymphadenopathy:     Cervical: No cervical adenopathy.  Skin:    Capillary Refill: Capillary  refill takes less than 2 seconds.     Coloration: Skin is not jaundiced or pale.     Findings: No rash.  Neurological:     General: No focal deficit present.     Mental Status: She is alert and oriented to person, place, and time.      UC Treatments / Results  Labs (all labs ordered are listed, but only abnormal results are displayed) Labs Reviewed  POC SARS CORONAVIRUS 2 AG -  ED - Abnormal; Notable for the following components:      Result Value   SARS Coronavirus 2 Ag Positive (*)    All other components within normal limits    EKG   Radiology No results found.  Procedures Procedures (including critical care time)  Medications Ordered in UC Medications - No data to display  Initial Impression / Assessment and Plan / UC Course  I have reviewed the triage vital signs and the nursing notes.  Pertinent labs & imaging results that were available during my care of the patient were reviewed by me and considered in my medical decision making (see chart for details).    Patient febrile, nontoxic in office today.  Patient took home Tylenol in office (1 g) which brought temperature from 102.26F to 101.79F and mild improvement of symptoms.  Patient previously underwent Covid testing 11/07/2019 for predominantly GI symptoms.  Given patient's fever, tachycardia, appearance rapid Covid was obtained in office: Positive.No respiratory distress or focal cardiopulmonary findings on exam today.  Patient reports good oral intake.  Will provide Zofran for nausea relief and stress importance of pushing fluids.  This provider contacted infusion clinic-counseled patient she should receive a call later today/tomorrow to see if she is eligible for therapy.  Return precautions discussed, patient verbalized understanding and is agreeable to plan. Final Clinical Impressions(s) / UC Diagnoses   Final diagnoses:  [id number]     Discharge Instructions     It is very important to remember that since you  have tested positive for Covid you need to be staying home and quarantining to help keep others safe and healthy in the community.  If you would like further evaluation for persistent or worsening symptoms, you may schedule an E-visit or virtual (video) visit throughout the [facility] app or website.  PLEASE NOTE: If you develop severe chest pain or shortness of breath please go to the ER or call 9-1-1 for further evaluation --> DO NOT schedule electronic or virtual visits for this. Please call our office for further guidance / recommendations as needed.  For information about the Covid vaccine, please visit [email or web address]    ED Prescriptions    Medication Sig Dispense Auth. Provider   ondansetron (ZOFRAN) 4 MG tablet Take 1 tablet (4 mg total) by mouth every 6 (six) hours. 12 tablet Hall-Potvin, [place], PA-C     PDMP not reviewed this encounter.   [place]don [place], [place] 11/24/19 1922

## 2019-11-24 NOTE — ED Triage Notes (Signed)
Pt states cough, fatigue and nausea for past 2wks. States this week having fever, congestion, dizziness, and bilateral ear pain. States dx'd with a acute URI 2 days ago. States at fever of 101.8 at 6am, states didn't treat. States her PCP won't see her in person and wants to been seen.

## 2019-11-24 NOTE — Telephone Encounter (Signed)
Please Advise

## 2019-11-28 ENCOUNTER — Telehealth (INDEPENDENT_AMBULATORY_CARE_PROVIDER_SITE_OTHER): Payer: 59 | Admitting: Family Medicine

## 2019-11-28 DIAGNOSIS — U071 COVID-19: Secondary | ICD-10-CM | POA: Diagnosis not present

## 2019-11-28 MED ORDER — AMLODIPINE BESYLATE 2.5 MG PO TABS: 2.5000 mg | ORAL_TABLET | Freq: Every day | ORAL | 1 refills | Status: DC

## 2019-11-28 MED ORDER — ONDANSETRON HCL 4 MG PO TABS: 4.0000 mg | ORAL_TABLET | Freq: Four times a day (QID) | ORAL | 0 refills | Status: DC

## 2019-11-28 MED ORDER — PROMETHAZINE-CODEINE 6.25-10 MG/5ML PO SOLN: 5.0000 mL | Freq: Every day | ORAL | 0 refills | Status: DC

## 2019-11-28 MED ORDER — GUAIFENESIN-DM 100-10 MG/5ML PO SYRP: 5.0000 mL | ORAL_SOLUTION | ORAL | 0 refills | Status: DC | PRN

## 2019-11-28 NOTE — Progress Notes (Signed)
Virtual Visit Note  I connected with patient on 11/28/19 at 519pm by video doximity and verified that I am speaking with the correct person using two identifiers. April Robinson is currently located at home and patient is currently with them during visit. The provider, Rutherford Guys, MD is located in their office at time of visit.  I discussed the limitations, risks, security and privacy concerns of performing an evaluation and management service by telephone and the availability of in person appointments. I also discussed with the patient that there may be a patient responsible charge related to this service. The patient expressed understanding and agreed to proceed.   CC: covid positive  HPI ? Seen in UC on Jan 29th, tested positive for covid, sx started 2 weeks prior with cough, fatigue, intermittent nausea but fever started on the 29th  She is not feeling well Continues to have fevers, taking APAP as needed Trying to push fluids, but hard at time Very tired and nauseous, no diarrhea Having coughing but no SOB Would like refill of cough meds Reports loss of taste or smell No chest pain or leg edema Work would like her to try to work from home Has not seen Pharmacist, community message for monoclonal ab infusion  Allergies  Allergen Reactions  . Meloxicam Shortness Of Breath and Nausea Only  . Miconazole Nitrate Other (See Comments)    SWELLING OF SOFT PALATE THAT CAUSED DIFFICULTY SWALLOWING  . Latex Itching, Rash and Other (See Comments)    Fine, little bumps    Prior to Admission medications   Medication Sig Start Date End Date Taking? Authorizing Provider  albuterol (PROVENTIL HFA;VENTOLIN HFA) 108 (90 Base) MCG/ACT inhaler Inhale 2 puffs into the lungs every 6 (six) hours as needed for wheezing or shortness of breath. 10/12/17  Yes Rutherford Guys, MD  amLODipine (NORVASC) 2.5 MG tablet TAKE 1 TABLET BY MOUTH EVERY DAY 09/11/19  Yes Rutherford Guys, MD  dexlansoprazole  (DEXILANT) 60 MG capsule Take 1 capsule (60 mg total) by mouth daily. 03/17/19  Yes Rutherford Guys, MD  fluticasone [facility]) 50 MCG/ACT nasal spray Place 2 sprays into both nostrils daily. 11/07/19  Yes Wendall Mola, NP  guaiFENesin-dextromethorphan (ROBITUSSIN DM) 100-10 MG/5ML syrup Take 5 mLs by mouth every 4 (four) hours as needed for cough. 11/21/19  Yes Maximiano Coss, NP  hydrOXYzine (VISTARIL) 25 MG capsule Take 1 capsule (25 mg total) by mouth 3 (three) times daily as needed. 05/04/19  Yes Rutherford Guys, MD  ibuprofen (ADVIL) 800 MG tablet Take 1 tablet (800 mg total) by mouth 3 (three) times daily as needed. 03/17/19  Yes Rutherford Guys, MD  Multiple Vitamin (MULTI-VITAMIN PO) Take by mouth daily.   Yes [provider]  Omega-3 Fatty Acids (FISH OIL PO) Take by mouth daily.   Yes [provider]  ondansetron (ZOFRAN) 4 MG tablet Take 1 tablet (4 mg total) by mouth every 6 (six) hours. 11/24/19  Yes Hall-Potvin, [place], PA-C  OVER THE COUNTER MEDICATION daily.   Yes [provider]  oxymetazoline (AFRIN) 0.05 % nasal spray Place 1 spray into both nostrils 2 (two) times daily. 11/21/19  Yes Maximiano Coss, NP  Promethazine-Codeine 6.25-10 MG/5ML SOLN Take 5 mLs by mouth at bedtime. 11/21/19  Yes Maximiano Coss, NP  tamoxifen (NOLVADEX) 20 MG tablet TAKE 1 TABLET BY MOUTH EVERY DAY 10/10/19  Yes Magrinat, Virgie Dad, MD  valACYclovir (VALTREX) 500 MG tablet TAKE ONE TAB TWICE DAILY  FOR 3-5 DAYS WITH OUTBREAK. 03/17/19  Yes Rutherford Guys, MD  venlafaxine XR (EFFEXOR-XR) 37.5 MG 24 hr capsule Take 1 capsule (37.5 mg total) by mouth daily with breakfast. 05/10/19  Yes Magrinat, Virgie Dad, MD  amoxicillin (AMOXIL) 875 MG tablet Take 1 tablet (875 mg total) by mouth 2 (two) times daily. Patient not taking: Reported on 11/28/2019 11/21/19   Maximiano Coss, NP  benzonatate (TESSALON) 200 MG capsule Take 1 capsule (200 mg total) by mouth 2 (two) times daily as needed  for cough. Patient not taking: Reported on 11/28/2019 11/21/19   Maximiano Coss, NP    Past Medical History:  Diagnosis Date  . Alcoholism ([place])   . Arthritis   . Cancer ([place])    breast L  . Esophageal reflux   . Hiatal hernia   . Hx of radiation therapy 08/27/11 to 10/15/11   L breast  . Hyperlipemia   . Hypertension    past not on any medications currently  . Personal history of colonic polyps    TUBULAR ADENOMA  . Personal history of radiation therapy     Past Surgical History:  Procedure Laterality Date  . ABDOMINAL HYSTERECTOMY     partial for DUB  . BREAST LUMPECTOMY  07/23/11   left breast  . COLONOSCOPY    . LAPAROSCOPIC HYSTERECTOMY  1995    Social History   Tobacco Use  . Smoking status: Former Smoker    Types: Cigarettes    Quit date: 1983    Years since quitting: 38.1  . Smokeless tobacco: Never Used  Substance Use Topics  . Alcohol use: Yes    Alcohol/week: 0.0 standard drinks    Comment: rare    Family History  Problem Relation Age of Onset  . Breast cancer Mother   . Cancer Mother        breast  . Hyperlipidemia Mother   . Diabetes Father   . Hypertension Father   . Cancer Father   . Hyperlipidemia Father   . Cancer Brother        NEUROENDOCRINE  . Hypertension Brother   . Arthritis Brother        hips  . Colon cancer Neg Hx     ROS Per hpi  Objective  Vitals as reported by the patient: none  GEN: AAOx3, NAD, ill appearing, very tired looking HEENT: /AT, pupils are symmetrical, EOMI, non-icteric sclera, dry MMM Resp: breathing comfortably, speaking in full sentences Skin: no rashes noted, no pallor   ASSESSMENT and PLAN  1. COVID-19 virus infection Clinically stable. Push fluids. Discussed quarantine guidelines. Letter given stating that she is not to work while ill. Discussed infusion with monoclonal ab, provided phone. ER precautions given.  - guaiFENesin-dextromethorphan (ROBITUSSIN DM) 100-10 MG/5ML syrup; Take 5 mLs by  mouth every 4 (four) hours as needed for cough. - Promethazine-Codeine 6.25-10 MG/5ML SOLN; Take 5 mLs by mouth at bedtime.  Other orders - amLODipine (NORVASC) 2.5 MG tablet; Take 1 tablet (2.5 mg total) by mouth daily. - ondansetron (ZOFRAN) 4 MG tablet; Take 1 tablet (4 mg total) by mouth every 6 (six) hours.  FOLLOW-UP: 1 week   The above assessment and management plan was discussed with the patient. The patient verbalized understanding of and has agreed to the management plan. Patient is aware to call the clinic if symptoms persist or worsen. Patient is aware when to return to the clinic for a follow-up visit. Patient educated on when it is appropriate to  go to the emergency department.    I provided 15 minutes of non-face-to-face time during this encounter.  Rutherford Guys, MD [facility] at [facility] [place], [place] [address] Ph.  [phone] Fax [phone]

## 2019-11-28 NOTE — Progress Notes (Signed)
follow up with covid, says not feeling good at all. Having fever that will not break, no smell or taste, fatigue, cough and no appetite. Went to urgent care last wk for meds. Asking for refill on the cough med. Overall just feels really bad

## 2019-11-29 ENCOUNTER — Emergency Department (HOSPITAL_COMMUNITY): Payer: 59

## 2019-11-29 ENCOUNTER — Other Ambulatory Visit: Payer: Self-pay

## 2019-11-29 ENCOUNTER — Encounter (HOSPITAL_COMMUNITY): Payer: Self-pay | Admitting: Internal Medicine

## 2019-11-29 ENCOUNTER — Telehealth: Payer: Self-pay | Admitting: Internal Medicine

## 2019-11-29 ENCOUNTER — Emergency Department (HOSPITAL_COMMUNITY)
Admission: EM | Admit: 2019-11-29 | Discharge: 2019-11-29 | Disposition: A | Discharge status: Home / Self Care | Payer: 59 | Attending: Emergency Medicine | Admitting: Emergency Medicine

## 2019-11-29 DIAGNOSIS — R509 Fever, unspecified: Secondary | ICD-10-CM | POA: Diagnosis present

## 2019-11-29 DIAGNOSIS — I10 Essential (primary) hypertension: Secondary | ICD-10-CM | POA: Diagnosis not present

## 2019-11-29 DIAGNOSIS — Z87891 Personal history of nicotine dependence: Secondary | ICD-10-CM | POA: Diagnosis not present

## 2019-11-29 DIAGNOSIS — Z853 Personal history of malignant neoplasm of breast: Secondary | ICD-10-CM | POA: Insufficient documentation

## 2019-11-29 DIAGNOSIS — R5383 Other fatigue: Secondary | ICD-10-CM | POA: Diagnosis not present

## 2019-11-29 DIAGNOSIS — U071 COVID-19: Secondary | ICD-10-CM | POA: Diagnosis not present

## 2019-11-29 DIAGNOSIS — E860 Dehydration: Secondary | ICD-10-CM | POA: Insufficient documentation

## 2019-11-29 DIAGNOSIS — Z79899 Other long term (current) drug therapy: Secondary | ICD-10-CM | POA: Insufficient documentation

## 2019-11-29 DIAGNOSIS — R05 Cough: Secondary | ICD-10-CM | POA: Diagnosis not present

## 2019-11-29 LAB — BASIC METABOLIC PANEL
Anion gap: 11 (ref 5–15)
BUN: 11 mg/dL (ref 6–20)
CO2: 24 mmol/L (ref 22–32)
Calcium: 8.3 mg/dL — ABNORMAL LOW (ref 8.9–10.3)
Chloride: 104 mmol/L (ref 98–111)
Creatinine, Ser: 1.27 mg/dL — ABNORMAL HIGH (ref 0.44–1.00)
GFR calc Af Amer: 53 mL/min — ABNORMAL LOW (ref >60)
GFR calc non Af Amer: 46 mL/min — ABNORMAL LOW (ref >60)
Glucose, Bld: 99 mg/dL (ref 70–99)
Potassium: 3.6 mmol/L (ref 3.5–5.1)
Sodium: 139 mmol/L (ref 135–145)

## 2019-11-29 LAB — CBC
HCT: 43 % (ref 36.0–46.0)
Hemoglobin: 14 g/dL (ref 12.0–15.0)
MCH: 28.7 pg (ref 26.0–34.0)
MCHC: 32.6 g/dL (ref 30.0–36.0)
MCV: 88.1 fL (ref 80.0–100.0)
Platelets: 186 10*3/uL (ref 150–400)
RBC: 4.88 MIL/uL (ref 3.87–5.11)
RDW: 13.5 % (ref 11.5–15.5)
WBC: 3.4 10*3/uL — ABNORMAL LOW (ref 4.0–10.5)
nRBC: 0 % (ref 0.0–0.2)

## 2019-11-29 MED ORDER — LACTATED RINGERS IV BOLUS
1000.0000 mL | Freq: Once | INTRAVENOUS | Status: AC
  Administered 2019-11-29: 1000 mL via INTRAVENOUS

## 2019-11-29 MED ORDER — ACETAMINOPHEN 325 MG PO TABS
325.0000 mg | ORAL_TABLET | Freq: Once | ORAL | Status: AC
  Administered 2019-11-29: 325 mg via ORAL
  Filled 2019-11-29: qty 1

## 2019-11-29 NOTE — ED Triage Notes (Signed)
Pt here with c/o dehydration and difficulty getting enough fluids.  Diagnosed with Covid on 11-24-2019, has telehealth visits since.   She reports continued fevers despite taking tylenol.  Denies resp difficulty or SOB, no pain sx noted.  Alert and in NAD.

## 2019-11-29 NOTE — Telephone Encounter (Signed)
Called to discuss with Victorino Sparrow Trageser about Covid symptoms and the use of bamlanivimab, a monoclonal antibody infusion for those with mild to moderate Covid symptoms and at a high risk of hospitalization.    Pt does not qualify for infusion therapy as her symptoms first presented > 10 days prior to timing of infusion. Symptoms tier reviewed as well as criteria for ending isolation. She initially called PCP office c c/o cough on 1/12, seen on 1/17 for GI complaints and 1/26 for upper respiratory symptoms. Pt continues to feel weak with fever and little po intake. She denies any sob. She has been instructed to f/u with PCP or ED if she feels necessary. Patient verbalized understanding   Patient Active Problem List   Diagnosis Date Noted  . Suspected COVID-19 virus infection 11/07/2019  . Cough 12/08/2018  . Tendonitis, Achilles, right 08/05/2017  . Left knee pain 04/23/2015  . Knee pain, left 04/11/2015  . Popliteal pain 04/11/2015  . Hypertension 10/17/2014  . Acute pharyngitis 09/04/2014  . Left shoulder pain 02/19/2014  . Benign paroxysmal positional vertigo 09/29/2013  . Hot flashes due to tamoxifen 08/16/2013  . Hx of vaginitis 04/15/2012  . De Quervain's disease (tenosynovitis) 04/05/2012  . Malignant neoplasm of upper-outer quadrant of left breast in female, estrogen receptor positive ([place])   . IBS 12/03/2010  . ELEVATED BP READING WITHOUT DX HYPERTENSION 12/03/2010  . NECK SPRAIN AND STRAIN 12/03/2010  . ANXIETY STATE, UNSPECIFIED 11/07/2010  . ADD 11/07/2010  . GERD 11/07/2010  . CHEST PAIN UNSPECIFIED 11/07/2010    Alan Ripper, NP-C [facility] [place]  pgr [phone]

## 2019-11-29 NOTE — ED Provider Notes (Signed)
[place] EMERGENCY DEPARTMENT Provider Note   CSN: [id number] Arrival date & time: 11/29/19  1158     History No chief complaint on file.  Ms. Tirah Dassow is a 59 y/o female. With a PMH of HTN, HLD, and L breast cancer, presents to the [facility] with COVID. Patient states that she tested positive for COVID-19 5 days ago after experiencing symptoms of fatigue, fever, cough, and loss of smell/taste. Before her arrival in the ED, she spoke with her PCP and was given promethazine codeine with relief to her cough. Patient comes today due to feelings of fatigue and dehydration. Patient states that she is unable to drink fluids or eat food. Per the patient, she can eat half and Eggo waffle and several slices of peaches before feeling nauseated. She states that she breaths well and has a home pulse ox that stays at 95% with a low of 92% and a high of 97%. She states that she has not been drinking enough fluids, and her urine has started to turn dark. She also has complaints of fever that has not been relieved with home dose of tylenol. She denies diaphoresis, vomiting, SHOB, chest pain, abdominal pain, constipation or diarrhea.         Past Medical History:  Diagnosis Date  . Alcoholism ([place])   . Arthritis   . Cancer ([place])    breast L  . Esophageal reflux   . Hiatal hernia   . Hx of radiation therapy 08/27/11 to 10/15/11   L breast  . Hyperlipemia   . Hypertension    past not on any medications currently  . Personal history of colonic polyps    TUBULAR ADENOMA  . Personal history of radiation therapy     Patient Active Problem List   Diagnosis Date Noted  . Suspected COVID-19 virus infection 11/07/2019  . Cough 12/08/2018  . Tendonitis, Achilles, right 08/05/2017  . Left knee pain 04/23/2015  . Knee pain, left 04/11/2015  . Popliteal pain 04/11/2015  . Hypertension 10/17/2014  . Acute pharyngitis 09/04/2014  . Left shoulder pain 02/19/2014  . Benign paroxysmal  positional vertigo 09/29/2013  . Hot flashes due to tamoxifen 08/16/2013  . Hx of vaginitis 04/15/2012  . De Quervain's disease (tenosynovitis) 04/05/2012  . Malignant neoplasm of upper-outer quadrant of left breast in female, estrogen receptor positive ([place])   . IBS 12/03/2010  . ELEVATED BP READING WITHOUT DX HYPERTENSION 12/03/2010  . NECK SPRAIN AND STRAIN 12/03/2010  . ANXIETY STATE, UNSPECIFIED 11/07/2010  . ADD 11/07/2010  . GERD 11/07/2010  . CHEST PAIN UNSPECIFIED 11/07/2010    Past Surgical History:  Procedure Laterality Date  . ABDOMINAL HYSTERECTOMY     partial for DUB  . BREAST LUMPECTOMY  07/23/11   left breast  . COLONOSCOPY    . LAPAROSCOPIC HYSTERECTOMY  1995     OB History    Gravida  3   Para  3   Term      Preterm      AB      Living  3     SAB      TAB      Ectopic      Multiple      Live Births              Family History  Problem Relation Age of Onset  . Breast cancer Mother   . Cancer Mother        breast  .  Hyperlipidemia Mother   . Diabetes Father   . Hypertension Father   . Cancer Father   . Hyperlipidemia Father   . Cancer Brother        NEUROENDOCRINE  . Hypertension Brother   . Arthritis Brother        hips  . Colon cancer Neg Hx     Social History   Tobacco Use  . Smoking status: Former Smoker    Types: Cigarettes    Quit date: 1983    Years since quitting: 38.1  . Smokeless tobacco: Never Used  Substance Use Topics  . Alcohol use: Yes    Alcohol/week: 0.0 standard drinks    Comment: rare  . Drug use: No    Home Medications Prior to Admission medications   Medication Sig Start Date End Date Taking? Authorizing Provider  albuterol (PROVENTIL HFA;VENTOLIN HFA) 108 (90 Base) MCG/ACT inhaler Inhale 2 puffs into the lungs every 6 (six) hours as needed for wheezing or shortness of breath. 10/12/17   Rutherford Guys, MD  amLODipine (NORVASC) 2.5 MG tablet Take 1 tablet (2.5 mg total) by mouth daily.  11/28/19   Rutherford Guys, MD  dexlansoprazole (DEXILANT) 60 MG capsule Take 1 capsule (60 mg total) by mouth daily. 03/17/19   Rutherford Guys, MD  fluticasone (FLONASE) 50 MCG/ACT nasal spray Place 2 sprays into both nostrils daily. 11/07/19   Wendall Mola, NP  guaiFENesin-dextromethorphan (ROBITUSSIN DM) 100-10 MG/5ML syrup Take 5 mLs by mouth every 4 (four) hours as needed for cough. 11/28/19   Rutherford Guys, MD  hydrOXYzine (VISTARIL) 25 MG capsule Take 1 capsule (25 mg total) by mouth 3 (three) times daily as needed. 05/04/19   Rutherford Guys, MD  ibuprofen (ADVIL) 800 MG tablet Take 1 tablet (800 mg total) by mouth 3 (three) times daily as needed. 03/17/19   Rutherford Guys, MD  Multiple Vitamin (MULTI-VITAMIN PO) Take by mouth daily.    [provider]  Omega-3 Fatty Acids (FISH OIL PO) Take by mouth daily.    [provider]  ondansetron (ZOFRAN) 4 MG tablet Take 1 tablet (4 mg total) by mouth every 6 (six) hours. 11/28/19   Rutherford Guys, MD  OVER THE COUNTER MEDICATION daily.    [provider]  oxymetazoline (AFRIN) 0.05 % nasal spray Place 1 spray into both nostrils 2 (two) times daily. 11/21/19   Maximiano Coss, NP  Promethazine-Codeine 6.25-10 MG/5ML SOLN Take 5 mLs by mouth at bedtime. 11/28/19   Rutherford Guys, MD  tamoxifen (NOLVADEX) 20 MG tablet TAKE 1 TABLET BY MOUTH EVERY DAY 10/10/19   Magrinat, Virgie Dad, MD  valACYclovir (VALTREX) 500 MG tablet TAKE ONE TAB TWICE DAILY FOR 3-5 DAYS WITH OUTBREAK. 03/17/19   Rutherford Guys, MD  venlafaxine XR (EFFEXOR-XR) 37.5 MG 24 hr capsule Take 1 capsule (37.5 mg total) by mouth daily with breakfast. 05/10/19   Magrinat, Virgie Dad, MD    Allergies    Meloxicam, Miconazole nitrate, and Latex  Review of Systems   Review of Systems  Constitutional: Positive for appetite change, chills, fatigue and fever. Negative for diaphoresis.  Respiratory: Positive for cough. Negative for chest tightness, shortness  of breath, wheezing and stridor.   Cardiovascular: Negative for chest pain and leg swelling.  Gastrointestinal: Positive for nausea. Negative for constipation, diarrhea and vomiting.  Endocrine: Positive for heat intolerance.  Genitourinary:       Dark Urine  Musculoskeletal: Positive for myalgias. Negative  for back pain.  Skin: Negative for pallor and rash.  Neurological: Negative for numbness.    Physical Exam Updated Vital Signs BP 117/76   Pulse (!) 104   Temp (!) 100.4 F (38 C) (Oral)   Resp (!) 25   SpO2 91%   Physical Exam Vitals and nursing note reviewed.  Constitutional:      General: She is not in acute distress.    Appearance: She is normal weight. She is ill-appearing.  HENT:     Head: Normocephalic and atraumatic.  Cardiovascular:     Rate and Rhythm: Normal rate and regular rhythm.     Pulses: Normal pulses.     Heart sounds: Normal heart sounds. No murmur. No friction rub. No gallop.   Pulmonary:     Effort: No respiratory distress.     Breath sounds: Normal breath sounds. No wheezing, rhonchi or rales.     Comments: tachypnea  Abdominal:     General: Abdomen is flat. Bowel sounds are normal.     Tenderness: There is no abdominal tenderness. There is no guarding.     Hernia: No hernia is present.  Musculoskeletal:        General: No swelling or tenderness. Normal range of motion.     Right lower leg: No edema.     Left lower leg: No edema.  Skin:    General: Skin is warm.  Neurological:     Mental Status: She is alert and oriented to person, place, and time.  Psychiatric:        Mood and Affect: Mood normal.        Behavior: Behavior normal.     ED Results / Procedures / Treatments   Labs (all labs ordered are listed, but only abnormal results are displayed) Labs Reviewed  CBC  BASIC METABOLIC PANEL    EKG None  Radiology No results found.  Procedures Procedures (including critical care time)  Medications Ordered in ED Medications   lactated ringers bolus 1,000 mL (has no administration in time range)    ED Course  I have reviewed the triage vital signs and the nursing notes.  Pertinent labs & imaging results that were available during my care of the patient were reviewed by me and considered in my medical decision making (see chart for details).   MDM Rules/Calculators/A&P                      Analiya Sato is a 59 y/o patient, with a PMH of HTN, HLD, L breast cancer, presents to the [facility] after a recent diagnosis of COVID 19. On physical examination, patient is dehydrated with no other significant examination findings. CBC and BMP were ordered and patient is being resuscitated with LR bolus. Patient was assessed and is safe to discharge home with instructions to come back if she experiences [facility], AMS, or acute decline in her condition.  Final Clinical Impression(s) / ED Diagnoses Final diagnoses:  None    Rx / DC Orders ED Discharge Orders    None       Maudie Mercury, MD 11/29/19 1549    Carmin Muskrat, MD 11/29/19 [phone]

## 2019-11-29 NOTE — Discharge Instructions (Addendum)
To April Robinson,  Thank you for coming to [facility] for your healthcare needs. During your stay, you had imaging of your chest, and your labs were checked in order to ensure that your COVID diagnosis has not progressed. Due to your dehydration you were given a liter of fluid. Please continue to monitor your symptoms at home. Please come back to the emergency department if you experience confusion, shortness of breath, or an acute decline in your health.

## 2019-12-04 ENCOUNTER — Telehealth (INDEPENDENT_AMBULATORY_CARE_PROVIDER_SITE_OTHER): Payer: 59 | Admitting: Family Medicine

## 2019-12-04 ENCOUNTER — Other Ambulatory Visit: Payer: Self-pay

## 2019-12-04 DIAGNOSIS — U071 COVID-19: Secondary | ICD-10-CM | POA: Diagnosis not present

## 2019-12-04 MED ORDER — BENZONATATE 100 MG PO CAPS: 100.0000 mg | ORAL_CAPSULE | Freq: Three times a day (TID) | ORAL | 0 refills | Status: DC | PRN

## 2019-12-04 NOTE — Progress Notes (Signed)
Virtual Visit Note  I connected with patient on 12/04/19 at 533pm by phone and verified that I am speaking with the correct person using two identifiers. April Robinson is currently located at home and patient is currently with them during visit. The provider, Rutherford Guys, MD is located in their office at time of visit.  I discussed the limitations, risks, security and privacy concerns of performing an evaluation and management service by telephone and the availability of in person appointments. I also discussed with the patient that there may be a patient responsible charge related to this service. The patient expressed understanding and agreed to proceed.   CC: covid positive Jan 29th  HPI ? ER feb 3rd - dehydrated, given IVF She reports that she since then she has been feeling a bit better, trying to sit in a chair for a couple of hours a day, trying to walk the hallway, still getting very winded and dizzy when she does these things Feel into a chair earlier today Her pulse ox remains in the mid 90s% No chest pain Still coughing whenever she talks more than couple of sentences, dry cough - cough med helps Last fever several days, today it was 98.5 She has been breaking water and sucking on popsicles Eating more, no diarrhea or vomiting She was supposed to go back to work today, which was 10 days from day of positive test  Allergies  Allergen Reactions  . Meloxicam Shortness Of Breath and Nausea Only  . Miconazole Nitrate Other (See Comments)    SWELLING OF SOFT PALATE THAT CAUSED DIFFICULTY SWALLOWING  . Latex Itching, Rash and Other (See Comments)    Fine, little bumps    Prior to Admission medications   Medication Sig Start Date End Date Taking? Authorizing Provider  albuterol (PROVENTIL HFA;VENTOLIN HFA) 108 (90 Base) MCG/ACT inhaler Inhale 2 puffs into the lungs every 6 (six) hours as needed for wheezing or shortness of breath. 10/12/17  Yes Rutherford Guys, MD    amLODipine (NORVASC) 2.5 MG tablet Take 1 tablet (2.5 mg total) by mouth daily. 11/28/19  Yes Rutherford Guys, MD  dexlansoprazole (DEXILANT) 60 MG capsule Take 1 capsule (60 mg total) by mouth daily. 03/17/19  Yes Rutherford Guys, MD  fluticasone [facility]) 50 MCG/ACT nasal spray Place 2 sprays into both nostrils daily. 11/07/19  Yes Wendall Mola, NP  guaiFENesin-dextromethorphan (ROBITUSSIN DM) 100-10 MG/5ML syrup Take 5 mLs by mouth every 4 (four) hours as needed for cough. 11/28/19  Yes Rutherford Guys, MD  hydrOXYzine (VISTARIL) 25 MG capsule Take 1 capsule (25 mg total) by mouth 3 (three) times daily as needed. 05/04/19  Yes Rutherford Guys, MD  ibuprofen (ADVIL) 800 MG tablet Take 1 tablet (800 mg total) by mouth 3 (three) times daily as needed. 03/17/19  Yes Rutherford Guys, MD  Multiple Vitamin (MULTI-VITAMIN PO) Take by mouth daily.   Yes [provider]  Omega-3 Fatty Acids (FISH OIL PO) Take by mouth daily.   Yes [provider]  ondansetron (ZOFRAN) 4 MG tablet Take 1 tablet (4 mg total) by mouth every 6 (six) hours. 11/28/19  Yes Rutherford Guys, MD  OVER THE COUNTER MEDICATION daily.   Yes [provider]  oxymetazoline (AFRIN) 0.05 % nasal spray Place 1 spray into both nostrils 2 (two) times daily. 11/21/19  Yes Maximiano Coss, NP  Promethazine-Codeine 6.25-10 MG/5ML SOLN Take 5 mLs by mouth at bedtime. 11/28/19  Yes Pamella Pert,  Lilia Argue, MD  tamoxifen (NOLVADEX) 20 MG tablet TAKE 1 TABLET BY MOUTH EVERY DAY 10/10/19  Yes Magrinat, Virgie Dad, MD  valACYclovir (VALTREX) 500 MG tablet TAKE ONE TAB TWICE DAILY FOR 3-5 DAYS WITH OUTBREAK. 03/17/19  Yes Rutherford Guys, MD  venlafaxine XR (EFFEXOR-XR) 37.5 MG 24 hr capsule Take 1 capsule (37.5 mg total) by mouth daily with breakfast. 05/10/19  Yes Magrinat, Virgie Dad, MD  [facility] TUSSIN DM MAX ST 20-400 MG/20ML LIQD Take 5 mLs by mouth every 4 (four) hours as needed. 11/21/19   [provider]    Past Medical  History:  Diagnosis Date  . Alcoholism ([place])   . Arthritis   . Cancer ([place])    breast L  . Esophageal reflux   . Hiatal hernia   . Hx of radiation therapy 08/27/11 to 10/15/11   L breast  . Hyperlipemia   . Hypertension    past not on any medications currently  . Personal history of colonic polyps    TUBULAR ADENOMA  . Personal history of radiation therapy     Past Surgical History:  Procedure Laterality Date  . ABDOMINAL HYSTERECTOMY     partial for DUB  . BREAST LUMPECTOMY  07/23/11   left breast  . COLONOSCOPY    . LAPAROSCOPIC HYSTERECTOMY  1995    Social History   Tobacco Use  . Smoking status: Former Smoker    Types: Cigarettes    Quit date: 1983    Years since quitting: 38.1  . Smokeless tobacco: Never Used  Substance Use Topics  . Alcohol use: Yes    Alcohol/week: 0.0 standard drinks    Comment: rare    Family History  Problem Relation Age of Onset  . Breast cancer Mother   . Cancer Mother        breast  . Hyperlipidemia Mother   . Diabetes Father   . Hypertension Father   . Cancer Father   . Hyperlipidemia Father   . Cancer Brother        NEUROENDOCRINE  . Hypertension Brother   . Arthritis Brother        hips  . Colon cancer Neg Hx     ROS Per hpi  Objective  Vitals as reported by the patient: per above  Gen: AAOx3, NAD Speaking interrupted by coughing fits But otherwise no apparent resp distress    ASSESSMENT and PLAN  1. COVID-19 virus infection Patient clinically stable but wo sign improvement. Continues to have cough, fatigue and DOE, normal O2 sats. Discussed cont supportive measures.  Discussed cont O2 and temp monitoring. Discussed no return to work and RTC precautions given.  Other orders - benzonatate (TESSALON) 100 MG capsule; Take 1-2 capsules (100-200 mg total) by mouth 3 (three) times daily as needed for cough.  FOLLOW-UP: 1 week   The above assessment and management plan was discussed with the patient. The  patient verbalized understanding of and has agreed to the management plan. Patient is aware to call the clinic if symptoms persist or worsen. Patient is aware when to return to the clinic for a follow-up visit. Patient educated on when it is appropriate to go to the emergency department.    I provided 16 minutes of non-face-to-face time during this encounter.  Rutherford Guys, MD Primary Care at [place],  [address] Ph.  [phone] Fax [phone]

## 2019-12-04 NOTE — Progress Notes (Signed)
Pt says she is feeling a little better. She is still fatigue, having sob when she tries to move around, no fever, she can taste on some days, no sense of smell yet, and having some brain fog. Feels she still is not ready to return bk to work.

## 2019-12-14 ENCOUNTER — Telehealth (INDEPENDENT_AMBULATORY_CARE_PROVIDER_SITE_OTHER): Payer: 59 | Admitting: Family Medicine

## 2019-12-14 ENCOUNTER — Other Ambulatory Visit: Payer: Self-pay

## 2019-12-14 DIAGNOSIS — R4701 Aphasia: Secondary | ICD-10-CM | POA: Diagnosis not present

## 2019-12-14 DIAGNOSIS — Z20822 Contact with and (suspected) exposure to covid-19: Secondary | ICD-10-CM

## 2019-12-14 DIAGNOSIS — R260 Ataxic gait: Secondary | ICD-10-CM

## 2019-12-14 NOTE — Progress Notes (Signed)
having some return on taste, not so much smell. Still extremely fatigue and dry cough. Did try to start bk with working from home. Only able to do a half a day at this point. Feels as though she can not function. 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 She has began to have fogginess and cannot recall things. Gave the example of looking at the remote for tv, not being able to recall the name of it to ask for it.She finds that her gait is also off. Does lots of staggering and has to hold wall. Notices that she has began to studder when speaking. This has never happen before. Says it happens now when she is having those foggy brain moments. Still taking the medication for the cough

## 2019-12-14 NOTE — Progress Notes (Signed)
Virtual Visit Note  I connected with patient on 12/14/19 at 459pm by video that had to be converted phone due to technical difficulties and verified that I am speaking with the correct person using two identifiers. April Robinson is currently located at home and patient is currently with them during visit. The provider, Rutherford Guys, MD is located in their office at time of visit.  I discussed the limitations, risks, security and privacy concerns of performing an evaluation and management service by telephone and the availability of in person appointments. I also discussed with the patient that there may be a patient responsible charge related to this service. The patient expressed understanding and agreed to proceed.   CC: covid   HPI covid positive jan 29th 2021 Has been working remotely 1/2 day every day this past week She reports that towards the end of her shift she is exhausted and needing to nap  She reports that towards the end of the day she has noticed that her legs feel weak, staggers, a bit dizzy if she stands up too quickly, denies focal weakness She has noticed problems with finding words, such as cup and remote, the word just gets lost Has been having intermittent vision changes. Was seeing spots last week, was having difficulties reading No upper extremity weakness Still having some SOB with exertion, O2 sats > 90 Dry cough remains, uses tessalon and tussin Right handed   Allergies  Allergen Reactions  . Meloxicam Shortness Of Breath and Nausea Only  . Miconazole Nitrate Other (See Comments)    SWELLING OF SOFT PALATE THAT CAUSED DIFFICULTY SWALLOWING  . Latex Itching, Rash and Other (See Comments)    Fine, little bumps    Prior to Admission medications   Medication Sig Start Date End Date Taking? Authorizing Provider  albuterol (PROVENTIL HFA;VENTOLIN HFA) 108 (90 Base) MCG/ACT inhaler Inhale 2 puffs into the lungs every 6 (six) hours as needed for wheezing  or shortness of breath. 10/12/17  Yes Rutherford Guys, MD  amLODipine (NORVASC) 2.5 MG tablet Take 1 tablet (2.5 mg total) by mouth daily. 11/28/19  Yes Rutherford Guys, MD  benzonatate (TESSALON) 100 MG capsule Take 1-2 capsules (100-200 mg total) by mouth 3 (three) times daily as needed for cough. 12/04/19  Yes Rutherford Guys, MD  [facility] TUSSIN DM MAX ST 20-400 MG/20ML LIQD Take 5 mLs by mouth every 4 (four) hours as needed. 11/21/19  Yes [provider]  dexlansoprazole (DEXILANT) 60 MG capsule Take 1 capsule (60 mg total) by mouth daily. 03/17/19  Yes Rutherford Guys, MD  fluticasone [facility]) 50 MCG/ACT nasal spray Place 2 sprays into both nostrils daily. 11/07/19  Yes Wendall Mola, NP  guaiFENesin-dextromethorphan (ROBITUSSIN DM) 100-10 MG/5ML syrup Take 5 mLs by mouth every 4 (four) hours as needed for cough. 11/28/19  Yes Rutherford Guys, MD  hydrOXYzine (VISTARIL) 25 MG capsule Take 1 capsule (25 mg total) by mouth 3 (three) times daily as needed. 05/04/19  Yes Rutherford Guys, MD  ibuprofen (ADVIL) 800 MG tablet Take 1 tablet (800 mg total) by mouth 3 (three) times daily as needed. 03/17/19  Yes Rutherford Guys, MD  Multiple Vitamin (MULTI-VITAMIN PO) Take by mouth daily.   Yes [provider]  Omega-3 Fatty Acids (FISH OIL PO) Take by mouth daily.   Yes [provider]  ondansetron (ZOFRAN) 4 MG tablet Take 1 tablet (4 mg total) by mouth every 6 (six) hours. 11/28/19  Yes Rutherford Guys, MD  OVER THE COUNTER MEDICATION daily.   Yes [provider]  oxymetazoline (AFRIN) 0.05 % nasal spray Place 1 spray into both nostrils 2 (two) times daily. 11/21/19  Yes Maximiano Coss, NP  tamoxifen (NOLVADEX) 20 MG tablet TAKE 1 TABLET BY MOUTH EVERY DAY 10/10/19  Yes Magrinat, Virgie Dad, MD  valACYclovir (VALTREX) 500 MG tablet TAKE ONE TAB TWICE DAILY FOR 3-5 DAYS WITH OUTBREAK. 03/17/19  Yes Rutherford Guys, MD  venlafaxine XR (EFFEXOR-XR) 37.5 MG 24 hr capsule Take  1 capsule (37.5 mg total) by mouth daily with breakfast. 05/10/19  Yes Magrinat, Virgie Dad, MD    Past Medical History:  Diagnosis Date  . Alcoholism ([place])   . Arthritis   . Cancer ([place])    breast L  . Esophageal reflux   . Hiatal hernia   . Hx of radiation therapy 08/27/11 to 10/15/11   L breast  . Hyperlipemia   . Hypertension    past not on any medications currently  . Personal history of colonic polyps    TUBULAR ADENOMA  . Personal history of radiation therapy     Past Surgical History:  Procedure Laterality Date  . ABDOMINAL HYSTERECTOMY     partial for DUB  . BREAST LUMPECTOMY  07/23/11   left breast  . COLONOSCOPY    . LAPAROSCOPIC HYSTERECTOMY  1995    Social History   Tobacco Use  . Smoking status: Former Smoker    Types: Cigarettes    Quit date: 1983    Years since quitting: 38.1  . Smokeless tobacco: Never Used  Substance Use Topics  . Alcohol use: Yes    Alcohol/week: 0.0 standard drinks    Comment: rare    Family History  Problem Relation Age of Onset  . Breast cancer Mother   . Cancer Mother        breast  . Hyperlipidemia Mother   . Diabetes Father   . Hypertension Father   . Cancer Father   . Hyperlipidemia Father   . Cancer Brother        NEUROENDOCRINE  . Hypertension Brother   . Arthritis Brother        hips  . Colon cancer Neg Hx     ROS Per hpi  Objective  Vitals as reported by the patient: none  GEN: AAOx3, NAD HEENT: [place]/AT, pupils are symmetrical, EOMI, non-icteric sclera Resp: breathing comfortably, speaking in full sentences Skin: no rashes noted, no pallor Psych: good eye contact, normal mood and affect   ASSESSMENT and PLAN  1. Suspected COVID-19 virus infection 2. Aphasia 3. Staggering gait - MR Brain W Wo Contrast; Future History concerning for neurological symptoms suggestive of CVA, subacute at this time. MRI ordered to further evaluate. Otherwise cont working part time for now.   FOLLOW-UP: 1 week in  office    The above assessment and management plan was discussed with the patient. The patient verbalized understanding of and has agreed to the management plan. Patient is aware to call the clinic if symptoms persist or worsen. Patient is aware when to return to the clinic for a follow-up visit. Patient educated on when it is appropriate to go to the emergency department.    I provided 25 minutes of non-face-to-face time during this encounter.  Rutherford Guys, MD Primary Care at [place],  [address] Ph.  [phone] Fax [phone]

## 2019-12-29 ENCOUNTER — Encounter: Payer: Self-pay | Admitting: Family Medicine

## 2019-12-29 ENCOUNTER — Other Ambulatory Visit: Payer: Self-pay

## 2019-12-29 ENCOUNTER — Ambulatory Visit: Payer: 59 | Admitting: Family Medicine

## 2019-12-29 VITALS — BP 150/98 | HR 86 | Temp 98.6°F | Resp 18 | Ht 62.0 in | Wt 182.0 lb

## 2019-12-29 DIAGNOSIS — Z8616 Personal history of COVID-19: Secondary | ICD-10-CM

## 2019-12-29 DIAGNOSIS — I10 Essential (primary) hypertension: Secondary | ICD-10-CM

## 2019-12-29 MED ORDER — VENLAFAXINE HCL ER 37.5 MG PO CP24: 37.5000 mg | ORAL_CAPSULE | Freq: Every day | ORAL | 4 refills | Status: DC

## 2019-12-29 MED ORDER — AMLODIPINE BESYLATE 2.5 MG PO TABS: 2.5000 mg | ORAL_TABLET | Freq: Every day | ORAL | 1 refills | Status: DC

## 2019-12-29 MED ORDER — BENZONATATE 100 MG PO CAPS: 100.0000 mg | ORAL_CAPSULE | Freq: Three times a day (TID) | ORAL | 2 refills | Status: DC | PRN

## 2019-12-29 NOTE — Progress Notes (Signed)
3/5/20212:09 PM  April Robinson [birth year], 59 y.o., female [id number]  Chief Complaint  Patient presents with  . 10/2019 post covid check    fatigue, weakness, brain fog, no taste or smell, tickle in throat     HPI:   Patient is a 59 y.o. female with past medical history significant for HTN and GAD who presents today for post covid symptoms  Last OV 2 weeks ago - telemedicine - ordered MRI given concerns for reported ataxia and problems with word finding MRI scheduled for March 20th Positive covid test Nov 24 2019  Patient reports that she is improving since last OV She feels about 50% better Cough remains major issue, specially in the afternoon, as she works in IT support with customers all day She is slowly getting taste back, smell not so much Still feeling fatigued and brain fog, specially towards end of the day DOE much improved Gait and speech back to normal Had eye exam last week, normal x for mild cataracts No fever or chills  Has been slowly increasing time back at work Has worked full time from home this entire week Ready to go back to office  Has not taken BP meds today She does check BP at home  Depression screen [facility] 2/9 12/29/2019 11/28/2019 11/21/2019  Decreased Interest 0 0 0  Down, Depressed, Hopeless 0 0 0  PHQ - 2 Score 0 0 0  Altered sleeping 0 - -  Tired, decreased energy 0 - -  Change in appetite 0 - -  Feeling bad or failure about yourself  0 - -  Trouble concentrating 0 - -  Moving slowly or fidgety/restless - - -  Suicidal thoughts 0 - -  PHQ-9 Score 0 - -  Difficult doing work/chores - - -  Some recent data might be hidden    Fall Risk  12/29/2019 11/28/2019 11/21/2019 11/07/2019 10/06/2019  Falls in the past year? 0 0 0 0 0  Number falls in past yr: 0 0 0 0 0  Injury with Fall? 0 0 0 0 0  Follow up Falls evaluation completed - Falls evaluation completed Falls evaluation completed Falls evaluation completed     Allergies  Allergen  Reactions  . Meloxicam Shortness Of Breath and Nausea Only  . Miconazole Nitrate Other (See Comments)    SWELLING OF SOFT PALATE THAT CAUSED DIFFICULTY SWALLOWING  . Latex Itching, Rash and Other (See Comments)    Fine, little bumps    Prior to Admission medications   Medication Sig Start Date End Date Taking? Authorizing Provider  albuterol (PROVENTIL HFA;VENTOLIN HFA) 108 (90 Base) MCG/ACT inhaler Inhale 2 puffs into the lungs every 6 (six) hours as needed for wheezing or shortness of breath. 10/12/17  Yes Rutherford Guys, MD  benzonatate (TESSALON) 100 MG capsule Take 1-2 capsules (100-200 mg total) by mouth 3 (three) times daily as needed for cough. 12/04/19  Yes Rutherford Guys, MD  [facility] TUSSIN DM MAX ST 20-400 MG/20ML LIQD Take 5 mLs by mouth every 4 (four) hours as needed. 11/21/19  Yes [provider]  dexlansoprazole (DEXILANT) 60 MG capsule Take 1 capsule (60 mg total) by mouth daily. 03/17/19  Yes Rutherford Guys, MD  ibuprofen (ADVIL) 800 MG tablet Take 1 tablet (800 mg total) by mouth 3 (three) times daily as needed. 03/17/19  Yes Rutherford Guys, MD  tamoxifen (NOLVADEX) 20 MG tablet TAKE 1 TABLET BY MOUTH EVERY DAY 10/10/19  Yes Magrinat, Virgie Dad,  MD  valACYclovir (VALTREX) 500 MG tablet TAKE ONE TAB TWICE DAILY FOR 3-5 DAYS WITH OUTBREAK. 03/17/19  Yes Rutherford Guys, MD  amLODipine (NORVASC) 2.5 MG tablet Take 1 tablet (2.5 mg total) by mouth daily. Patient not taking: Reported on 12/29/2019 11/28/19   Rutherford Guys, MD  fluticasone [facility]) 50 MCG/ACT nasal spray Place 2 sprays into both nostrils daily. Patient not taking: Reported on 12/29/2019 11/07/19   Wendall Mola, NP  guaiFENesin-dextromethorphan (ROBITUSSIN DM) 100-10 MG/5ML syrup Take 5 mLs by mouth every 4 (four) hours as needed for cough. Patient not taking: Reported on 12/29/2019 11/28/19   Rutherford Guys, MD  hydrOXYzine (VISTARIL) 25 MG capsule Take 1 capsule (25 mg total) by mouth 3 (three) times  daily as needed. Patient not taking: Reported on 12/29/2019 05/04/19   Rutherford Guys, MD  Multiple Vitamin (MULTI-VITAMIN PO) Take by mouth daily.    [provider]  Omega-3 Fatty Acids (FISH OIL PO) Take by mouth daily.    [provider]  ondansetron (ZOFRAN) 4 MG tablet Take 1 tablet (4 mg total) by mouth every 6 (six) hours. Patient not taking: Reported on 12/29/2019 11/28/19   Rutherford Guys, MD  OVER THE COUNTER MEDICATION daily.    [provider]  oxymetazoline (AFRIN) 0.05 % nasal spray Place 1 spray into both nostrils 2 (two) times daily. Patient not taking: Reported on 12/29/2019 11/21/19   Maximiano Coss, NP  venlafaxine XR (EFFEXOR-XR) 37.5 MG 24 hr capsule Take 1 capsule (37.5 mg total) by mouth daily with breakfast. Patient not taking: Reported on 12/29/2019 05/10/19   Magrinat, Virgie Dad, MD    Past Medical History:  Diagnosis Date  . Alcoholism ([place])   . Arthritis   . Cancer ([place])    breast L  . Esophageal reflux   . Hiatal hernia   . Hx of radiation therapy 08/27/11 to 10/15/11   L breast  . Hyperlipemia   . Hypertension    past not on any medications currently  . Personal history of colonic polyps    TUBULAR ADENOMA  . Personal history of radiation therapy     Past Surgical History:  Procedure Laterality Date  . ABDOMINAL HYSTERECTOMY     partial for DUB  . BREAST LUMPECTOMY  07/23/11   left breast  . COLONOSCOPY    . LAPAROSCOPIC HYSTERECTOMY  1995    Social History   Tobacco Use  . Smoking status: Former Smoker    Types: Cigarettes    Quit date: 1983    Years since quitting: 38.2  . Smokeless tobacco: Never Used  Substance Use Topics  . Alcohol use: Yes    Alcohol/week: 0.0 standard drinks    Comment: rare    Family History  Problem Relation Age of Onset  . Breast cancer Mother   . Cancer Mother        breast  . Hyperlipidemia Mother   . Diabetes Father   . Hypertension Father   . Cancer Father   . Hyperlipidemia  Father   . Cancer Brother        NEUROENDOCRINE  . Hypertension Brother   . Arthritis Brother        hips  . Colon cancer Neg Hx     ROS Per hpi  OBJECTIVE:  Today's Vitals   12/29/19 1337 12/29/19 1352  BP: (!) 152/87 (!) 150/98  Pulse: 86   Resp: 18   Temp: 98.6 F (37 C)  TempSrc: Oral   SpO2: 99%   Weight: 182 lb (82.6 kg)   Height: 5\' 2"  (1.575 m)    Body mass index is 33.29 kg/m.   Physical Exam Vitals and nursing note reviewed.  Constitutional:      Appearance: She is well-developed.  HENT:     Head: Normocephalic and atraumatic.     Mouth/Throat:     Pharynx: No oropharyngeal exudate.  Eyes:     General: No scleral icterus.    Conjunctiva/sclera: Conjunctivae normal.     Pupils: Pupils are equal, round, and reactive to light.  Cardiovascular:     Rate and Rhythm: Normal rate and regular rhythm.     Heart sounds: Normal heart sounds. No murmur. No friction rub. No gallop.   Pulmonary:     Effort: Pulmonary effort is normal.     Breath sounds: Normal breath sounds. No wheezing or rales.  Musculoskeletal:     Cervical back: Neck supple.  Skin:    General: Skin is warm and dry.  Neurological:     Mental Status: She is alert and oriented to person, place, and time.     Cranial Nerves: Cranial nerves are intact.     Sensory: Sensation is intact.     Motor: Motor function is intact. No weakness or pronator drift.     Coordination: Coordination is intact. Romberg sign negative. Heel to [facility] Test normal. Rapid alternating movements normal.     Gait: Gait is intact. Tandem walk normal.     Deep Tendon Reflexes: Reflexes are normal and symmetric.     No results found for this or any previous visit (from the past 24 hour(s)).  No results found.   ASSESSMENT and PLAN  1. History of COVID-19 Recovering well. Able to return to work in office wo restrictions. Cont with tessalon pearls as needed.   2. Essential hypertension Above goal as off meds.  Restart. Cont home BP monitoring. Reach out if BP 140/90 or greater - Basic Metabolic Panel  Other orders - benzonatate (TESSALON) 100 MG capsule; Take 1-2 capsules (100-200 mg total) by mouth 3 (three) times daily as needed for cough. - venlafaxine XR (EFFEXOR-XR) 37.5 MG 24 hr capsule; Take 1 capsule (37.5 mg total) by mouth daily with breakfast. - amLODipine (NORVASC) 2.5 MG tablet; Take 1 tablet (2.5 mg total) by mouth daily.  Return in about 2 months (around 02/28/2020).    Rutherford Guys, MD Primary Care at [place], [place] [address] Ph.  [phone] Fax [phone]

## 2019-12-29 NOTE — Patient Instructions (Signed)
° ° ° °  If you have lab work done today you will be contacted with your lab results within the next 2 weeks.  If you have not heard from us then please contact us. The fastest way to get your results is to register for My Chart. ° ° °IF you received an x-ray today, you will receive an invoice from [facility]. Please contact [facility] at [phone] with questions or concerns regarding your invoice.  ° °IF you received labwork today, you will receive an invoice from [facility]. Please contact [facility] at [phone] with questions or concerns regarding your invoice.  ° °Our billing staff will not be able to assist you with questions regarding bills from these companies. ° °You will be contacted with the lab results as soon as they are available. The fastest way to get your results is to activate your My Chart account. Instructions are located on the last page of this paperwork. If you have not heard from us regarding the results in 2 weeks, please contact this office. °  ° ° ° °

## 2019-12-30 LAB — BASIC METABOLIC PANEL
BUN/Creatinine Ratio: 11 — ABNORMAL LOW (ref 12–28)
BUN: 10 mg/dL (ref 8–27)
CO2: 22 mmol/L (ref 20–29)
Calcium: 9.1 mg/dL (ref 8.7–10.3)
Chloride: 108 mmol/L — ABNORMAL HIGH (ref 96–106)
Creatinine, Ser: 0.94 mg/dL (ref 0.57–1.00)
GFR calc Af Amer: 76 mL/min/{1.73_m2} (ref >59)
GFR calc non Af Amer: 66 mL/min/{1.73_m2} (ref >59)
Glucose: 95 mg/dL (ref 65–99)
Potassium: 4 mmol/L (ref 3.5–5.2)
Sodium: 144 mmol/L (ref 134–144)

## 2020-01-13 ENCOUNTER — Ambulatory Visit
Admission: RE | Admit: 2020-01-13 | Discharge: 2020-01-13 | Disposition: A | Discharge status: Home / Self Care | Payer: 59 | Source: Ambulatory Visit | Attending: Family Medicine | Admitting: Family Medicine

## 2020-01-13 ENCOUNTER — Other Ambulatory Visit: Payer: Self-pay

## 2020-01-13 DIAGNOSIS — R4701 Aphasia: Secondary | ICD-10-CM

## 2020-01-13 DIAGNOSIS — Z20822 Contact with and (suspected) exposure to covid-19: Secondary | ICD-10-CM

## 2020-01-13 DIAGNOSIS — R260 Ataxic gait: Secondary | ICD-10-CM

## 2020-01-13 MED ORDER — GADOBENATE DIMEGLUMINE 529 MG/ML IV SOLN
17.0000 mL | Freq: Once | INTRAVENOUS | Status: AC | PRN
  Administered 2020-01-13: 17 mL via INTRAVENOUS

## 2020-01-23 ENCOUNTER — Telehealth: Payer: Self-pay | Admitting: Family Medicine

## 2020-01-23 NOTE — Telephone Encounter (Signed)
Pt states she had covid 11/24/2019. Covid vaccine is now available at her workplace and she would like to receive it.  States Dr. Pamella Pert told her to wait until May or June.  Pt is questioning if she can go ahead and get the vaccine and why she was directed to wait so long. Asymptomatic.   CB# [phone]

## 2020-01-23 NOTE — Telephone Encounter (Signed)
Called pt she hung up on me will try again later.

## 2020-01-23 NOTE — Telephone Encounter (Signed)
Called pt and left message to call back.

## 2020-02-02 ENCOUNTER — Other Ambulatory Visit: Payer: Self-pay | Admitting: Oncology

## 2020-03-01 ENCOUNTER — Ambulatory Visit: Payer: 59 | Admitting: Family Medicine

## 2020-03-01 ENCOUNTER — Other Ambulatory Visit: Payer: Self-pay

## 2020-03-01 ENCOUNTER — Encounter: Payer: Self-pay | Admitting: Family Medicine

## 2020-03-01 VITALS — BP 132/86 | HR 91 | Temp 98.2°F | Ht 62.0 in | Wt 183.0 lb

## 2020-03-01 DIAGNOSIS — I10 Essential (primary) hypertension: Secondary | ICD-10-CM

## 2020-03-01 DIAGNOSIS — K219 Gastro-esophageal reflux disease without esophagitis: Secondary | ICD-10-CM

## 2020-03-01 DIAGNOSIS — Z566 Other physical and mental strain related to work: Secondary | ICD-10-CM | POA: Diagnosis not present

## 2020-03-01 NOTE — Patient Instructions (Signed)
° ° ° °  If you have lab work done today you will be contacted with your lab results within the next 2 weeks.  If you have not heard from us then please contact us. The fastest way to get your results is to register for My Chart. ° ° °IF you received an x-ray today, you will receive an invoice from [facility]. Please contact [facility] at [phone] with questions or concerns regarding your invoice.  ° °IF you received labwork today, you will receive an invoice from [facility]. Please contact [facility] at [phone] with questions or concerns regarding your invoice.  ° °Our billing staff will not be able to assist you with questions regarding bills from these companies. ° °You will be contacted with the lab results as soon as they are available. The fastest way to get your results is to activate your My Chart account. Instructions are located on the last page of this paperwork. If you have not heard from us regarding the results in 2 weeks, please contact this office. °  ° ° ° °

## 2020-03-01 NOTE — Progress Notes (Signed)
5/7/20214:47 PM  April Robinson 1961/05/14, 59 y.o., female [id number]  Chief Complaint  Patient presents with  . Hypertension    HPI:   Patient is a 59 y.o. female with past medical history significant for HTN and GAD who presents today for followup on HTN  Last OV march 2021 - restarted amlodipine 2.5mg   She continues to slowly get taste and smell Had a job interview last week Continues to be at stressful job Her mother has advanced dementia, hospice involved Has not been taking effexor phq9 and gad7 noted  Lab Results  Component Value Date   CREATININE 0.94 12/29/2019   BUN 10 12/29/2019   NA 144 12/29/2019   K 4.0 12/29/2019   CL 108 (H) 12/29/2019   CO2 22 12/29/2019    Depression screen PHQ 2/9 03/01/2020 12/29/2019 11/28/2019  Decreased Interest 0 0 0  Down, Depressed, Hopeless 1 0 0  PHQ - 2 Score 1 0 0  Altered sleeping 1 0 -  Tired, decreased energy 1 0 -  Change in appetite 0 0 -  Feeling bad or failure about yourself  1 0 -  Trouble concentrating 0 0 -  Moving slowly or fidgety/restless 1 - -  Suicidal thoughts 0 0 -  PHQ-9 Score 5 0 -  Difficult doing work/chores Somewhat difficult - -  Some recent data might be hidden   GAD 7 : Generalized Anxiety Score 10/06/2019 05/04/2019  Nervous, Anxious, on Edge 1 1  Control/stop worrying 1 1  Worry too much - different things 1 2  Trouble relaxing 2 1  Restless 0 0  Easily annoyed or irritable 0 1  Afraid - awful might happen 0 2  Total GAD 7 Score 5 8  Anxiety Difficulty Somewhat difficult Somewhat difficult     Fall Risk  03/01/2020 12/29/2019 11/28/2019 11/21/2019 11/07/2019  Falls in the past year? 0 0 0 0 0  Number falls in past yr: 0 0 0 0 0  Injury with Fall? 0 0 0 0 0  Follow up Falls evaluation completed Falls evaluation completed - Falls evaluation completed Falls evaluation completed     Allergies  Allergen Reactions  . Meloxicam Shortness Of Breath and Nausea Only  . Miconazole Nitrate Other  (See Comments)    SWELLING OF SOFT PALATE THAT CAUSED DIFFICULTY SWALLOWING  . Latex Itching, Rash and Other (See Comments)    Fine, little bumps    Prior to Admission medications   Medication Sig Start Date End Date Taking? Authorizing Provider  albuterol (PROVENTIL HFA;VENTOLIN HFA) 108 (90 Base) MCG/ACT inhaler Inhale 2 puffs into the lungs every 6 (six) hours as needed for wheezing or shortness of breath. 10/12/17  Yes Rutherford Guys, MD  amLODipine (NORVASC) 2.5 MG tablet Take 1 tablet (2.5 mg total) by mouth daily. 12/29/19  Yes Rutherford Guys, MD  dexlansoprazole (DEXILANT) 60 MG capsule Take 1 capsule (60 mg total) by mouth daily. 03/17/19  Yes Rutherford Guys, MD  Multiple Vitamin (MULTI-VITAMIN PO) Take by mouth daily.   Yes [provider]  Omega-3 Fatty Acids (FISH OIL PO) Take by mouth daily.   Yes [provider]  valACYclovir (VALTREX) 500 MG tablet TAKE ONE TAB TWICE DAILY FOR 3-5 DAYS WITH OUTBREAK. 03/17/19  Yes Rutherford Guys, MD  guaiFENesin-dextromethorphan [facility] DM) 100-10 MG/5ML syrup Take 5 mLs by mouth every 4 (four) hours as needed for cough. Patient not taking: Reported on 12/29/2019 11/28/19   Rutherford Guys,  MD  ibuprofen (ADVIL) 800 MG tablet Take 1 tablet (800 mg total) by mouth 3 (three) times daily as needed. Patient not taking: Reported on 03/01/2020 03/17/19   Rutherford Guys, MD  [place] daily.    [provider]  tamoxifen (NOLVADEX) 20 MG tablet TAKE 1 TABLET BY MOUTH EVERY DAY 02/02/20   Magrinat, Virgie Dad, MD  venlafaxine XR (EFFEXOR-XR) 37.5 MG 24 hr capsule Take 1 capsule (37.5 mg total) by mouth daily with breakfast. Patient not taking: Reported on 03/01/2020 12/29/19   Rutherford Guys, MD    Past Medical History:  Diagnosis Date  . Alcoholism ([place])   . Arthritis   . Cancer ([place])    breast L  . Esophageal reflux   . Hiatal hernia   . Hx of radiation therapy 08/27/11 to 10/15/11   L breast  .  Hyperlipemia   . Hypertension    past not on any medications currently  . Personal history of colonic polyps    TUBULAR ADENOMA  . Personal history of radiation therapy     Past Surgical History:  Procedure Laterality Date  . ABDOMINAL HYSTERECTOMY     partial for DUB  . BREAST LUMPECTOMY  07/23/11   left breast  . COLONOSCOPY    . LAPAROSCOPIC HYSTERECTOMY  1995    Social History   Tobacco Use  . Smoking status: Former Smoker    Types: Cigarettes    Quit date: 1983    Years since quitting: 38.3  . Smokeless tobacco: Never Used  Substance Use Topics  . Alcohol use: Yes    Alcohol/week: 0.0 standard drinks    Comment: rare    Family History  Problem Relation Age of Onset  . Breast cancer Mother   . Cancer Mother        breast  . Hyperlipidemia Mother   . Diabetes Father   . Hypertension Father   . Cancer Father   . Hyperlipidemia Father   . Cancer Brother        NEUROENDOCRINE  . Hypertension Brother   . Arthritis Brother        hips  . Colon cancer Neg Hx     ROS Per hpi  OBJECTIVE:  Today's Vitals   03/01/20 1637  BP: 132/86  Pulse: 91  Temp: 98.2 F (36.8 C)  SpO2: 100%  Weight: 183 lb (83 kg)  Height: 5\' 2"  (1.575 m)   Body mass index is 33.47 kg/m.   Physical Exam Vitals and nursing note reviewed.  Constitutional:      Appearance: She is well-developed.  HENT:     Head: Normocephalic and atraumatic.  Eyes:     General: No scleral icterus.    Conjunctiva/sclera: Conjunctivae normal.     Pupils: Pupils are equal, round, and reactive to light.  Pulmonary:     Effort: Pulmonary effort is normal.  Musculoskeletal:     Cervical back: Neck supple.  Skin:    General: Skin is warm and dry.  Neurological:     Mental Status: She is alert and oriented to person, place, and time.       No results found for this or any previous visit (from the past 24 hour(s)).  No results found.   ASSESSMENT and PLAN  1. Essential  hypertension Controlled. Continue current regime.   2. Gastroesophageal reflux disease without esophagitis Controlled. Continue current regime.   3. Work-related stress Stable. Patient taking steps towards solution. Discussed ok  to restart effexor if needed.   Return in about 6 months (around 09/01/2020).    Rutherford Guys, MD Primary [facility] at [facility] [place], [place] [address] Ph.  [phone] Fax [phone]

## 2020-04-05 ENCOUNTER — Other Ambulatory Visit: Payer: Self-pay | Admitting: Oncology

## 2020-04-05 DIAGNOSIS — Z1231 Encounter for screening mammogram for malignant neoplasm of breast: Secondary | ICD-10-CM

## 2020-05-06 ENCOUNTER — Other Ambulatory Visit: Payer: Self-pay | Admitting: Oncology

## 2020-05-07 ENCOUNTER — Ambulatory Visit
Admission: RE | Admit: 2020-05-07 | Discharge: 2020-05-07 | Disposition: A | Discharge status: Home / Self Care | Payer: Managed Care, Other (non HMO) | Source: Ambulatory Visit | Attending: Oncology | Admitting: Oncology

## 2020-05-07 ENCOUNTER — Other Ambulatory Visit: Payer: Self-pay

## 2020-05-07 DIAGNOSIS — Z1231 Encounter for screening mammogram for malignant neoplasm of breast: Secondary | ICD-10-CM

## 2020-05-09 ENCOUNTER — Inpatient Hospital Stay: Payer: Managed Care, Other (non HMO)

## 2020-05-09 ENCOUNTER — Other Ambulatory Visit: Payer: Self-pay

## 2020-05-09 ENCOUNTER — Inpatient Hospital Stay: Payer: Managed Care, Other (non HMO) | Attending: Oncology | Admitting: Oncology

## 2020-05-09 VITALS — BP 149/75 | HR 83 | Temp 98.5°F | Resp 20 | Ht 62.0 in | Wt 182.8 lb

## 2020-05-09 DIAGNOSIS — Z923 Personal history of irradiation: Secondary | ICD-10-CM | POA: Diagnosis not present

## 2020-05-09 DIAGNOSIS — Z8616 Personal history of COVID-19: Secondary | ICD-10-CM | POA: Insufficient documentation

## 2020-05-09 DIAGNOSIS — Z79810 Long term (current) use of selective estrogen receptor modulators (SERMs): Secondary | ICD-10-CM | POA: Diagnosis not present

## 2020-05-09 DIAGNOSIS — Z170 Estrogen receptor positive status [ER+]: Secondary | ICD-10-CM | POA: Diagnosis not present

## 2020-05-09 DIAGNOSIS — C50412 Malignant neoplasm of upper-outer quadrant of left female breast: Secondary | ICD-10-CM | POA: Diagnosis present

## 2020-05-09 LAB — COMPREHENSIVE METABOLIC PANEL
ALT: 14 U/L (ref 0–44)
AST: 13 U/L — ABNORMAL LOW (ref 15–41)
Albumin: 3.6 g/dL (ref 3.5–5.0)
Alkaline Phosphatase: 71 U/L (ref 38–126)
Anion gap: 7 (ref 5–15)
BUN: 15 mg/dL (ref 6–20)
CO2: 26 mmol/L (ref 22–32)
Calcium: 8.8 mg/dL — ABNORMAL LOW (ref 8.9–10.3)
Chloride: 110 mmol/L (ref 98–111)
Creatinine, Ser: 1.05 mg/dL — ABNORMAL HIGH (ref 0.44–1.00)
GFR calc Af Amer: >60 mL/min (ref >60)
GFR calc non Af Amer: 58 mL/min — ABNORMAL LOW (ref >60)
Glucose, Bld: 107 mg/dL — ABNORMAL HIGH (ref 70–99)
Potassium: 3.5 mmol/L (ref 3.5–5.1)
Sodium: 143 mmol/L (ref 135–145)
Total Bilirubin: 0.4 mg/dL (ref 0.3–1.2)
Total Protein: 6.7 g/dL (ref 6.5–8.1)

## 2020-05-09 LAB — CBC WITH DIFFERENTIAL/PLATELET
Abs Immature Granulocytes: 0.02 10*3/uL (ref 0.00–0.07)
Basophils Absolute: 0 10*3/uL (ref 0.0–0.1)
Basophils Relative: 1 %
Eosinophils Absolute: 0.1 10*3/uL (ref 0.0–0.5)
Eosinophils Relative: 2 %
HCT: 41.6 % (ref 36.0–46.0)
Hemoglobin: 13.6 g/dL (ref 12.0–15.0)
Immature Granulocytes: 0 %
Lymphocytes Relative: 40 %
Lymphs Abs: 2.2 10*3/uL (ref 0.7–4.0)
MCH: 28.3 pg (ref 26.0–34.0)
MCHC: 32.7 g/dL (ref 30.0–36.0)
MCV: 86.7 fL (ref 80.0–100.0)
Monocytes Absolute: 0.4 10*3/uL (ref 0.1–1.0)
Monocytes Relative: 7 %
Neutro Abs: 2.7 10*3/uL (ref 1.7–7.7)
Neutrophils Relative %: 50 %
Platelets: 220 10*3/uL (ref 150–400)
RBC: 4.8 MIL/uL (ref 3.87–5.11)
RDW: 14.1 % (ref 11.5–15.5)
WBC: 5.4 10*3/uL (ref 4.0–10.5)
nRBC: 0 % (ref 0.0–0.2)

## 2020-05-09 NOTE — Progress Notes (Signed)
ID: April Robinson   DOB: Mar 19, 1961  MR#: [id number]  NWG#:[id number]  Patient Care Team: Rutherford Guys, MD as PCP - General (Family Medicine) Mycala Warshawsky, Virgie Dad, MD as Consulting Physician (Hematology and Oncology) Huel Cote, NP as Consulting Physician (Obstetrics and Gynecology) Hilarie Fredrickson, Lajuan Lines, MD as Consulting Physician (Gastroenterology) OTHER MD:   CHIEF COMPLAINT:  Hx of Left Breast Cancer  CURRENT TREATMENT: tamoxifen    INTERVAL HISTORY:  April Robinson returns today for follow-up and treatment of her history of left breast cancer. She was last seen here on 05/10/2019.   She continues on tamoxifen.  She does have significant hot flashes from this but she manages and does not want to take any other medications to control that symptom.  Since her last visit here, she was diagnosed with COVID-19 in 10/2019.  It was pretty rough she says but she and her whole family (who also had it) got through it.  She subsequently received the majority of vaccine and tolerated that well.    She noted a speech issue and muscle weakness following her infection. MRI head with and without contrast on 01/13/2020 revealed a negative brain MRI.   She underwent a bilateral screening mammogram with tomography on 05/07/2020 at [facility]. Results of this study are still pending at the time of visit   REVIEW OF SYSTEMS:  Parul quit her job because of the terrible stress it was causing her.  She now feels "happy", and is enjoying line dancing, taking care of the home, and normal activities.  A detailed review of systems today was otherwise stable     BREAST CANCER HISTORY: From the original intake note:  The patient had a questionable palpable finding in the left breast which was evaluated in July 2009 and it was recommended that she return for further evaluation but actually did not show.  She had bilateral diagnostic mammography in March 2011 which showed minimal duct ectasia of the left  breast at the location in question.  Again, 38-monthinterval mammography was recommended and this was repeated August 2012.  There was minimally a more prominent ductal ectasia and MRI was recommended for further evaluation.  This was performed on June 22, 2011, and it showed a 1.2 cm enhancing mass in the posterior aspect of an area of irregular linear enhancement.  Anteriorly to this, there was a 6 mm area which is the area of dilated duct noted by prior evaluation.   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 The tumor was estrogen receptor positive at 72%, progesterone receptor positive at 5%, with a very low MIB1 at 4%.  HER2 was not amplified with a ratio by [facility] of 1.10.   The patient's subsequent history is as detailed below.  PAST MEDICAL HISTORY: Past Medical History:  Diagnosis Date  . [place]   . Ar[place]ritis   . [place]    breast L  . Esophageal reflux   . Hiatal hernia   . Hx of radiation therapy 08/27/11 to 10/15/11   L breast  . Hyperlipemia   . Hypertension    past not on any medications currently  . Personal history of colonic polyps    TUBULAR ADENOMA  . Personal history of radiation therapy     PAST SURGICAL HISTORY: Past Surgical History:  Procedure  Laterality Date  . ABDOMINAL HYSTERECTOMY     partial for DUB  . BREAST LUMPECTOMY  07/23/11   left breast  . COLONOSCOPY    . LAPAROSCOPIC HYSTERECTOMY  1995  no BSO, still has both ovaries   FAMILY HISTORY Family History  Problem Relation Age of Onset  . Breast cancer Mother   . Cancer Mother        breast  . Hyperlipidemia Mother   . Diabetes Father   . Hypertension Father   . Cancer Father   . Hyperlipidemia Father   . Cancer Brother        NEUROENDOCRINE  . Hypertension Brother   . Arthritis Brother         hips  . Colon cancer Neg Hx   The patient's father is alive at age 38. The patient's father has breast cancer. The patient's mother is alive at age 82.  She was diagnosed with breast cancer at age 53.  The patient had 2 brothers; one of them died from a neuroendocrine tumor.  She has 1 sister.  There is no other breast or ovarian cancer in the immediate family.   GYNECOLOGIC HISTORY:  (Reviewed 02/19/2014) She had menarche, age 69. She is GX P3 with first live birth at age 33. She had a hysterectomy in 1993, but did not have her ovaries removed.  She took hormone replacement for at least 2 years.     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  She quit her job in June 2021 because it was causing her too much stress.  Her husband, Norvell Ureste, works as a Administrator.  The patient's children from a prior marriage are:  April Robinson, 34, who lives in [place] and works as a Production assistant, radio.  April Robinson who works as a Pharmacist, hospital of ages between 24 and 12; she is also a Presenter, broadcasting.  April Robinson, 29, who studies accounting at [employer]. The patient has 8 grandchildren. She is very active in her local church    ADVANCED DIRECTIVES: Not in place   HEALTH MAINTENANCE:  Social History   Tobacco Use  . Smoking status: Former Smoker    Types: Cigarettes    Quit date: 1983    Years since quitting: 38.5  . Smokeless tobacco: Never Used  Vaping Use  . Vaping Use: Never used  Substance Use Topics  . Alcohol use: Yes    Alcohol/week: 0.0 standard drinks    Comment: rare  . Drug use: No     Colonoscopy: 10/02/2016/ normal/ Dr. Raquel James  PAP:  [facility], Dr. Toney Rakes  Bone density:  09/18/2013, normal  Lipid panel:  [facility], Dr. Etter Sjogren   Allergies  Allergen Reactions  . Meloxicam Shortness Of Breath and Nausea Only  . Miconazole Nitrate Other (See Comments)     SWELLING OF SOFT PALATE THAT CAUSED DIFFICULTY SWALLOWING  . Latex Itching, Rash and Other (See Comments)    Fine, little bumps    Current Outpatient Medications  Medication Sig Dispense Refill  . albuterol (PROVENTIL HFA;VENTOLIN HFA) 108 (90 Base) MCG/ACT inhaler Inhale 2 puffs into the lungs every 6 (six) hours as needed for wheezing or shortness of breath. 1 Inhaler 0  . amLODipine (NORVASC) 2.5 MG tablet Take 1 tablet (2.5 mg total) by mouth daily. 90 tablet 1  . dexlansoprazole (DEXILANT) 60 MG capsule Take 1 capsule (  60 mg total) by mouth daily. 90 capsule 3  . guaiFENesin-dextromethorphan (ROBITUSSIN DM) 100-10 MG/5ML syrup Take 5 mLs by mouth every 4 (four) hours as needed for cough. (Patient not taking: Reported on 12/29/2019) 118 mL 0  . ibuprofen (ADVIL) 800 MG tablet Take 1 tablet (800 mg total) by mouth 3 (three) times daily as needed. (Patient not taking: Reported on 03/01/2020) 30 tablet 2  . Multiple Vitamin (MULTI-VITAMIN PO) Take by mouth daily.    . Omega-3 Fatty Acids (FISH OIL PO) Take by mouth daily.    Marland Kitchen OVER THE COUNTER MEDICATION daily.    . tamoxifen (NOLVADEX) 20 MG tablet TAKE 1 TABLET BY MOUTH EVERY DAY 90 tablet 0  . valACYclovir (VALTREX) 500 MG tablet TAKE ONE TAB TWICE DAILY FOR 3-5 DAYS WITH OUTBREAK. 10 tablet 2  . venlafaxine XR (EFFEXOR-XR) 37.5 MG 24 hr capsule Take 1 capsule (37.5 mg total) by mouth daily with breakfast. (Patient not taking: Reported on 03/01/2020) 90 capsule 4   No current facility-administered medications for this visit.    OBJECTIVE:  African American woman in no acute distress  Vitals:   05/09/20 1357  BP: (!) 149/75  Pulse: 83  Resp: 20  Temp: 98.5 F (36.9 C)  SpO2: 100%     Body mass index is 33.43 kg/m.    ECOG FS: 1 Filed Weights   05/09/20 1357  Weight: 182 lb 12.8 oz (82.9 kg)   Sclerae unicteric, EOMs intact Wearing a mask No cervical or supraclavicular adenopathy Lungs no rales or rhonchi Heart regular rate and  rhythm Abd soft, nontender, positive bowel sounds MSK no focal spinal tenderness Neuro: nonfocal, well oriented, appropriate affect Breasts: The right breast is benign.  The left breast is status post lumpectomy followed by radiation but there is no evidence of local recurrence.  Both axillae are benign.  LAB RESULTS: Lab Results  Component Value Date   WBC 5.4 05/09/2020   NEUTROABS 2.7 05/09/2020   HGB 13.6 05/09/2020   HCT 41.6 05/09/2020   MCV 86.7 05/09/2020   PLT 220 05/09/2020      Chemistry      Component Value Date/Time   NA 143 05/09/2020 1300   NA 144 12/29/2019 1542   NA 143 09/03/2016 0830   K 3.5 05/09/2020 1300   K 4.0 09/03/2016 0830   CL 110 05/09/2020 1300   CL 107 01/30/2013 1509   CO2 26 05/09/2020 1300   CO2 26 09/03/2016 0830   BUN 15 05/09/2020 1300   BUN 10 12/29/2019 1542   BUN 14.6 09/03/2016 0830   CREATININE 1.05 (H) 05/09/2020 1300   CREATININE 1.1 09/03/2016 0830      Component Value Date/Time   CALCIUM 8.8 (L) 05/09/2020 1300   CALCIUM 9.3 09/03/2016 0830   ALKPHOS 71 05/09/2020 1300   ALKPHOS 67 09/03/2016 0830   AST 13 (L) 05/09/2020 1300   AST 21 09/03/2016 0830   ALT 14 05/09/2020 1300   ALT 22 09/03/2016 0830   BILITOT 0.4 05/09/2020 1300   BILITOT 0.4 03/17/2019 1427   BILITOT 0.51 09/03/2016 0830        STUDIES: MM 3D SCREEN BREAST BILATERAL  Result Date: 05/09/2020 CLINICAL DATA:  Screening. EXAM: DIGITAL SCREENING BILATERAL MAMMOGRAM WITH TOMO AND CAD COMPARISON:  Previous exam(s). ACR Breast Density Category b: There are scattered areas of fibroglandular density. FINDINGS: There are no findings suspicious for malignancy. Stable post lumpectomy changes on the left. Images were processed with CAD. IMPRESSION:  No mammographic evidence of malignancy. A result letter of this screening mammogram will be mailed directly to the patient. RECOMMENDATION: Screening mammogram in one year. (Code:SM-B-01Y) BI-RADS CATEGORY  2: Benign.  Electronically Signed   By: Lajean Manes M.D.   On: 05/09/2020 12:27   ASSESSMENT: 59 y.o.  Savage woman   (1)  status post left lumpectomy and sentinel lymph node biopsy September of 2012 for a T1c N0, Stage IA  invasive ductal carcinoma, grade 2, estrogen and progesterone receptor positive with a very low MIB-1 and no HER-2 amplification.   (2)  She completed adjuvant radiation 10/15/2011   (3) started tamoxifen 10/27/2011, the plan being to continue for total of 10 years until 2023.  (4) s/p remote TAH, with ovaries still intact    PLAN:  Marcee is now nearly 9 years out from definitive surgery for breast cancer with no evidence of disease recurrence.  This is very favorable.    She is tolerating tamoxifen well and the plan is to continue that an additional 2 years to make a total of 10.  I have encouraged her to exercise regularly.  She knows to call for any other issues that may develop before the next visit  Total encounter time 20 minutes.*  Leannah Guse, Virgie Dad, MD  05/09/20 2:27 PM Medical Oncology and Hematology [facility] [address] [place], [place] [address] Tel. [phone]    Fax. [phone]    I, Jacqualyn Posey am acting as a Education administrator for Chauncey Cruel, MD.   I, Lurline Del MD, have reviewed the above documentation for accuracy and completeness, and I agree with the above.    *Total Encounter Time as defined by the Centers for Medicare and Medicaid Services includes, in addition to the face-to-face time of a patient visit (documented in the note above) non-face-to-face time: obtaining and reviewing outside history, ordering and reviewing medications, tests or procedures, care coordination (communications with other health care professionals or caregivers) and documentation in the medical record.

## 2020-05-31 ENCOUNTER — Other Ambulatory Visit: Payer: Self-pay | Admitting: Family Medicine

## 2020-06-02 ENCOUNTER — Other Ambulatory Visit: Payer: Self-pay | Admitting: Family Medicine

## 2020-06-07 ENCOUNTER — Other Ambulatory Visit: Payer: Self-pay | Admitting: Family Medicine

## 2020-08-22 ENCOUNTER — Other Ambulatory Visit: Payer: Self-pay

## 2020-08-22 ENCOUNTER — Ambulatory Visit (INDEPENDENT_AMBULATORY_CARE_PROVIDER_SITE_OTHER): Payer: Managed Care, Other (non HMO) | Admitting: Family Medicine

## 2020-08-22 DIAGNOSIS — Z23 Encounter for immunization: Secondary | ICD-10-CM | POA: Diagnosis not present

## 2020-08-22 NOTE — Patient Instructions (Signed)
° ° ° °  If you have lab work done today you will be contacted with your lab results within the next 2 weeks.  If you have not heard from us then please contact us. The fastest way to get your results is to register for My Chart. ° ° °IF you received an x-ray today, you will receive an invoice from [facility]. Please contact [facility] at [phone] with questions or concerns regarding your invoice.  ° °IF you received labwork today, you will receive an invoice from [facility]. Please contact [facility] at [phone] with questions or concerns regarding your invoice.  ° °Our billing staff will not be able to assist you with questions regarding bills from these companies. ° °You will be contacted with the lab results as soon as they are available. The fastest way to get your results is to activate your My Chart account. Instructions are located on the last page of this paperwork. If you have not heard from us regarding the results in 2 weeks, please contact this office. °  ° ° ° °

## 2020-09-29 IMAGING — CR DG LUMBAR SPINE COMPLETE 4+V
5 series · 5 of 5 positions shown · non-contrast
Comparison: None.

CLINICAL DATA: Left side low back pain.

EXAM:
LUMBAR SPINE - COMPLETE 4+ VIEW

[t lumbar spine ap]
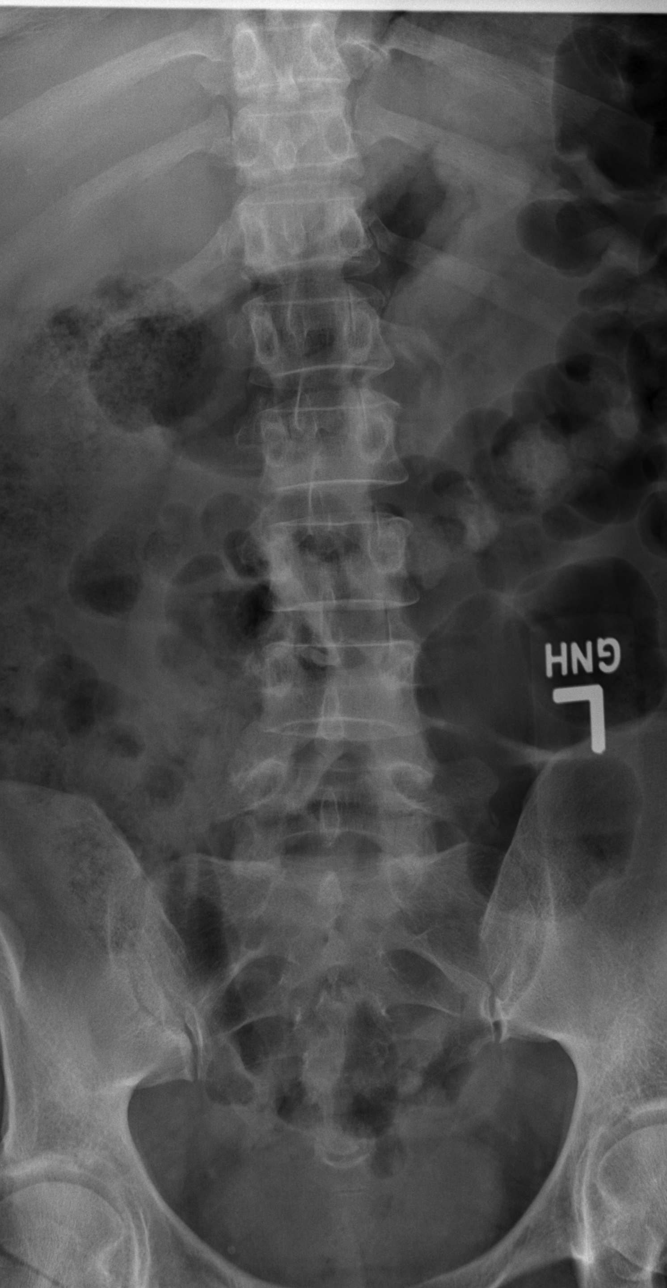

[t lumbar spine obl (1 of 2)]
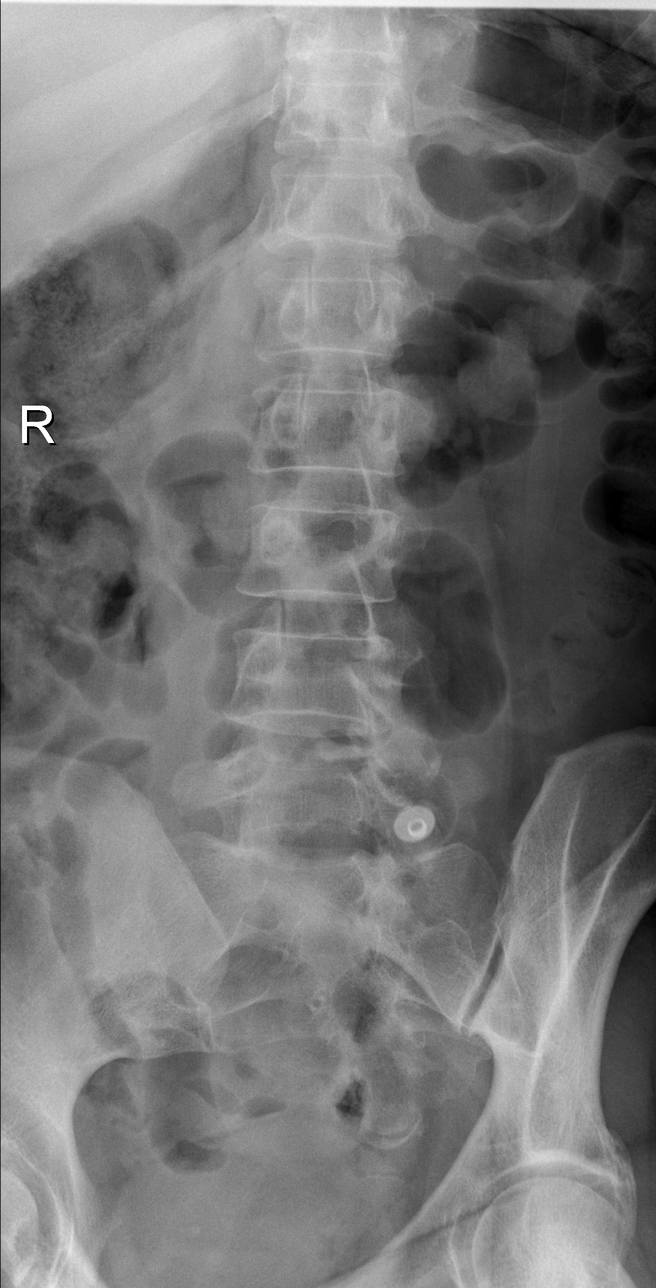

[t lumbar spine obl (2 of 2)]
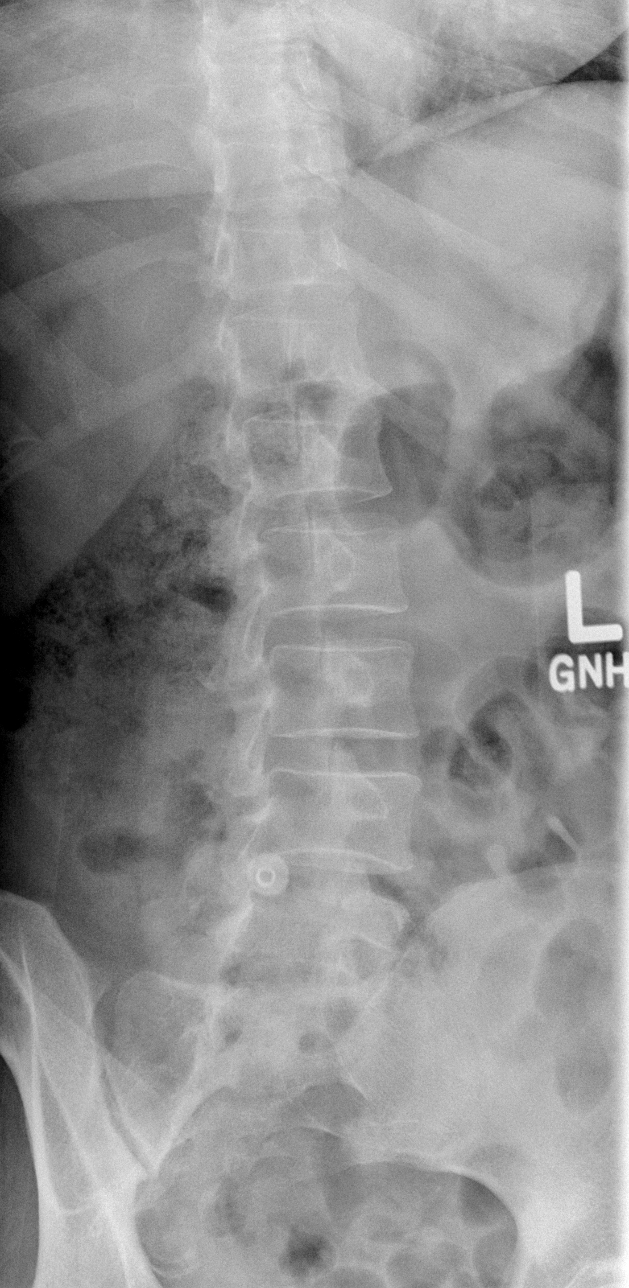

[t lumbar spine lat]
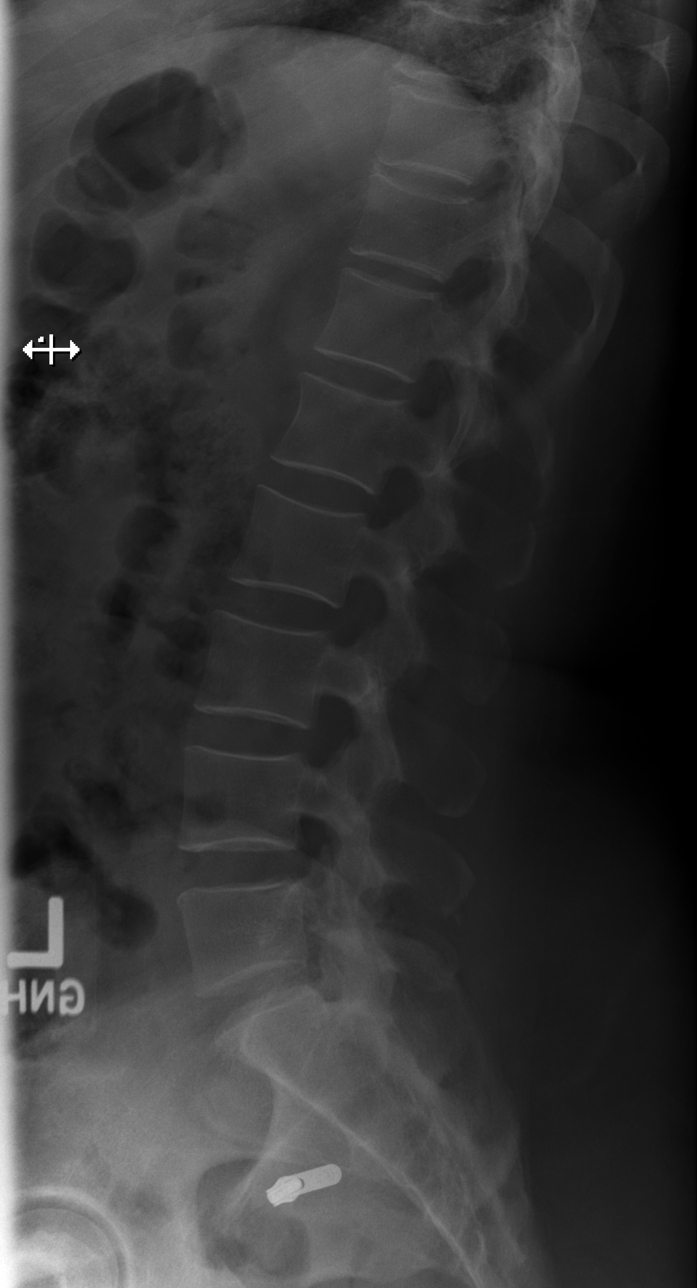

[t lumbar l-5 s-1 spot]
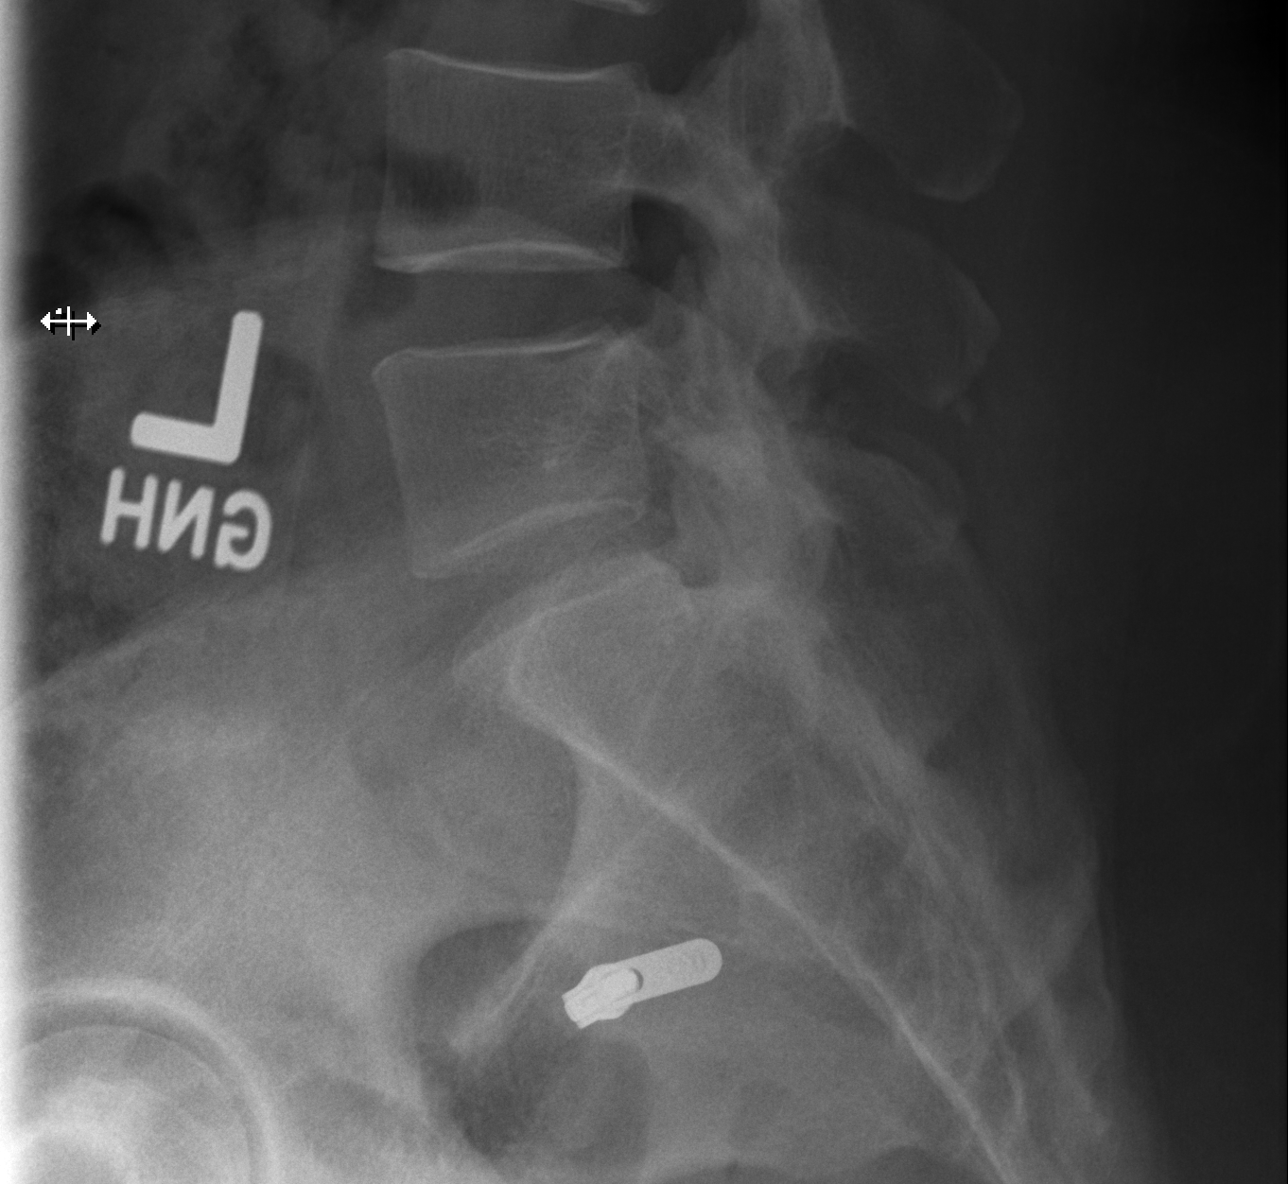

[5 of 5 positions shown; findings below may reference images not displayed]

FINDINGS: There is no evidence of lumbar spine fracture. Alignment is normal.
Intervertebral disc spaces are maintained.
IMPRESSION: Negative.

## 2020-10-28 ENCOUNTER — Other Ambulatory Visit: Payer: Managed Care, Other (non HMO)

## 2020-10-30 ENCOUNTER — Other Ambulatory Visit: Payer: Self-pay

## 2020-10-30 ENCOUNTER — Other Ambulatory Visit: Payer: Managed Care, Other (non HMO)

## 2020-10-30 DIAGNOSIS — Z20822 Contact with and (suspected) exposure to covid-19: Secondary | ICD-10-CM

## 2020-11-01 LAB — SARS-COV-2, NAA 2 DAY TAT

## 2020-11-01 LAB — NOVEL CORONAVIRUS, NAA: SARS-CoV-2, NAA: NOT DETECTED

## 2020-12-18 ENCOUNTER — Other Ambulatory Visit: Payer: Self-pay | Admitting: Oncology

## 2021-01-30 ENCOUNTER — Ambulatory Visit
Admission: EM | Admit: 2021-01-30 | Discharge: 2021-01-30 | Disposition: A | Discharge status: Home / Self Care | Payer: Managed Care, Other (non HMO) | Attending: Emergency Medicine | Admitting: Emergency Medicine

## 2021-01-30 ENCOUNTER — Other Ambulatory Visit: Payer: Self-pay

## 2021-01-30 DIAGNOSIS — K219 Gastro-esophageal reflux disease without esophagitis: Secondary | ICD-10-CM | POA: Diagnosis not present

## 2021-01-30 DIAGNOSIS — Z760 Encounter for issue of repeat prescription: Secondary | ICD-10-CM | POA: Diagnosis not present

## 2021-01-30 MED ORDER — DEXLANSOPRAZOLE 60 MG PO CPDR: 60.0000 mg | DELAYED_RELEASE_CAPSULE | Freq: Every day | ORAL | 0 refills | Status: DC

## 2021-01-30 MED ORDER — DEXLANSOPRAZOLE 30 MG PO CPDR: 60.0000 mg | DELAYED_RELEASE_CAPSULE | Freq: Every day | ORAL | 0 refills | Status: DC

## 2021-01-30 NOTE — ED Provider Notes (Signed)
[facility]    CSN: [id number] Arrival date & time: 01/30/21  1709      History   Chief Complaint Chief Complaint  Patient presents with  . Medication Refill    HPI April Robinson is a 60 y.o. female history of GERD presenting today for medication refill.  Reports that she ran out of her Dexilant a few days ago and reports her acid has been flaring since.  Reports burning sensation and discomfort within her chest/throat.  Denies any nausea or vomiting.  Symptoms very typical for her acid symptoms.  Has been using Tums with some relief.  Denies abdominal pain.  Denies change in bowel movements. HPI  Past Medical History:  Diagnosis Date  . Alcoholism ([place])   . Arthritis   . Cancer ([place])    breast L  . Esophageal reflux   . Hiatal hernia   . Hx of radiation therapy 08/27/11 to 10/15/11   L breast  . Hyperlipemia   . Hypertension    past not on any medications currently  . Personal history of colonic polyps    TUBULAR ADENOMA  . Personal history of radiation therapy     Patient Active Problem List   Diagnosis Date Noted  . Suspected COVID-19 virus infection 11/07/2019  . Cough 12/08/2018  . Tendonitis, Achilles, right 08/05/2017  . Left knee pain 04/23/2015  . Knee pain, left 04/11/2015  . Popliteal pain 04/11/2015  . Hypertension 10/17/2014  . Acute pharyngitis 09/04/2014  . Left shoulder pain 02/19/2014  . Benign paroxysmal positional vertigo 09/29/2013  . Hot flashes due to tamoxifen 08/16/2013  . Hx of vaginitis 04/15/2012  . De Quervain's disease (tenosynovitis) 04/05/2012  . Malignant neoplasm of upper-outer quadrant of left breast in female, estrogen receptor positive ([place])   . IBS 12/03/2010  . ELEVATED BP READING WITHOUT DX HYPERTENSION 12/03/2010  . NECK SPRAIN AND STRAIN 12/03/2010  . ANXIETY STATE, UNSPECIFIED 11/07/2010  . ADD 11/07/2010  . GERD 11/07/2010  . CHEST PAIN UNSPECIFIED 11/07/2010    Past Surgical History:   Procedure Laterality Date  . ABDOMINAL HYSTERECTOMY     partial for DUB  . BREAST LUMPECTOMY  07/23/11   left breast  . COLONOSCOPY    . LAPAROSCOPIC HYSTERECTOMY  1995    OB History    Gravida  3   Para  3   Term      Preterm      AB      Living  3     SAB      IAB      Ectopic      Multiple      Live Births               Home Medications    Prior to Admission medications   Medication Sig Start Date End Date Taking? Authorizing Provider  albuterol (PROVENTIL HFA;VENTOLIN HFA) 108 (90 Base) MCG/ACT inhaler Inhale 2 puffs into the lungs every 6 (six) hours as needed for wheezing or shortness of breath. 10/12/17   Jacelyn Pi, Lilia Argue, MD  amLODipine (NORVASC) 2.5 MG tablet TAKE 1 TABLET BY MOUTH EVERY DAY 05/31/20   Jacelyn Pi, Lilia Argue, MD  dexlansoprazole (DEXILANT) 60 MG capsule Take 1 capsule (60 mg total) by mouth daily. 01/30/21   Tkai Serfass C, PA-C  Multiple Vitamin (MULTI-VITAMIN PO) Take by mouth daily.    [provider]  Omega-3 Fatty Acids (FISH OIL PO) Take by mouth daily.  [provider]  OVER THE COUNTER MEDICATION daily.    [provider]  tamoxifen (NOLVADEX) 20 MG tablet TAKE 1 TABLET BY MOUTH EVERY DAY 12/18/20   Magrinat, Virgie Dad, MD  valACYclovir (VALTREX) 500 MG tablet TAKE 1 TABLET BY MOUTH TWICE DAILY FOR 3-5 DAYS WITH OUTBREAK. 06/07/20   Jacelyn Pi, Lilia Argue, MD    Family History Family History  Problem Relation Age of Onset  . Breast cancer Mother   . Cancer Mother        breast  . Hyperlipidemia Mother   . Diabetes Father   . Hypertension Father   . Cancer Father   . Hyperlipidemia Father   . Cancer Brother        NEUROENDOCRINE  . Hypertension Brother   . Arthritis Brother        hips  . Colon cancer Neg Hx     Social History Social History   Tobacco Use  . Smoking status: Former Smoker    Types: Cigarettes    Quit date: 1983    Years since quitting: 39.2  . Smokeless  tobacco: Never Used  Vaping Use  . Vaping Use: Never used  Substance Use Topics  . Alcohol use: Yes    Alcohol/week: 0.0 standard drinks    Comment: rare  . Drug use: No     Allergies   Meloxicam, Miconazole nitrate, and Latex   Review of Systems Review of Systems  Constitutional: Negative for fatigue and fever.  Eyes: Negative for visual disturbance.  Respiratory: Negative for shortness of breath.   Cardiovascular: Negative for chest pain.  Gastrointestinal: Negative for abdominal pain, nausea and vomiting.  Musculoskeletal: Negative for arthralgias and joint swelling.  Skin: Negative for color change, rash and wound.  Neurological: Negative for dizziness, weakness, light-headedness and headaches.     Physical Exam Triage Vital Signs ED Triage Vitals  Enc Vitals Group     BP 01/30/21 1850 131/86     Pulse Rate 01/30/21 1850 80     Resp 01/30/21 1850 18     Temp 01/30/21 1850 98.4 F (36.9 C)     Temp Source 01/30/21 1850 Oral     SpO2 01/30/21 1850 98 %     Weight --      Height --      Head Circumference --      Peak Flow --      Pain Score 01/30/21 1851 0     Pain Loc --      Pain Edu? --      Excl. in [place]? --    No data found.  Updated Vital Signs BP 131/86 (BP Location: Right Arm)   Pulse 80   Temp 98.4 F (36.9 C) (Oral)   Resp 18   SpO2 98%   Visual Acuity Right Eye Distance:   Left Eye Distance:   Bilateral Distance:    Right Eye Near:   Left Eye Near:    Bilateral Near:     Physical Exam Vitals and nursing note reviewed.  Constitutional:      Appearance: She is well-developed.     Comments: No acute distress  HENT:     Head: Normocephalic and atraumatic.     Nose: Nose normal.  Eyes:     Conjunctiva/sclera: Conjunctivae normal.  Cardiovascular:     Rate and Rhythm: Normal rate and regular rhythm.  Pulmonary:     Effort: Pulmonary effort is normal. No respiratory distress.  Comments: Breathing comfortably at rest, CTABL, no  wheezing, rales or other adventitious sounds auscultated Abdominal:     General: There is no distension.     Comments: Soft, nondistended, nontender to palpation throughout abdomen  Musculoskeletal:        General: Normal range of motion.     Cervical back: Neck supple.  Skin:    General: Skin is warm and dry.  Neurological:     Mental Status: She is alert and oriented to person, place, and time.      UC Treatments / Results  Labs (all labs ordered are listed, but only abnormal results are displayed) Labs Reviewed - No data to display  EKG   Radiology No results found.  Procedures Procedures (including critical care time)  Medications Ordered in UC Medications - No data to display  Initial Impression / Assessment and Plan / UC Course  I have reviewed the triage vital signs and the nursing notes.  Pertinent labs & imaging results that were available during my care of the patient were reviewed by me and considered in my medical decision making (see chart for details).     GERD/medication refill-refilled Dexilant, patient declined GI cocktail.  May continue with Tums/Pepcid as needed for more immediate relief as Dexilant suppressing symptoms.  Patient to follow-up if symptoms different from typical GERD symptoms.  Discussed strict return precautions. Patient verbalized understanding and is agreeable with plan.  Final Clinical Impressions(s) / UC Diagnoses   Final diagnoses:  Gastroesophageal reflux disease, unspecified whether esophagitis present  Medication refill     Discharge Instructions     Dexilant refilled Follow-up as needed    ED Prescriptions    Medication Sig Dispense Auth. Provider   dexlansoprazole (DEXILANT) 60 MG capsule Take 1 capsule (60 mg total) by mouth daily. 90 capsule Deane Wattenbarger, [place] C, PA-C     PDMP not reviewed this encounter.   Janith Lima, [place] 01/30/21 1916

## 2021-01-30 NOTE — Discharge Instructions (Signed)
Dexilant refilled Follow-up as needed

## 2021-01-30 NOTE — ED Triage Notes (Signed)
Pt states had to get a new PCP and next appt is 5/12. States out of her dexilant, states her acid reflux is bothering her. Eating tums with little relief.

## 2021-02-19 ENCOUNTER — Other Ambulatory Visit: Payer: Self-pay | Admitting: Oncology

## 2021-02-19 DIAGNOSIS — Z1231 Encounter for screening mammogram for malignant neoplasm of breast: Secondary | ICD-10-CM

## 2021-03-02 ENCOUNTER — Other Ambulatory Visit: Payer: Self-pay | Admitting: Oncology

## 2021-03-03 ENCOUNTER — Telehealth: Payer: Self-pay | Admitting: Oncology

## 2021-03-03 NOTE — Telephone Encounter (Signed)
R/s appts per 5/8 sch msg. Pt aware.  

## 2021-03-14 ENCOUNTER — Other Ambulatory Visit: Payer: Self-pay | Admitting: Gastroenterology

## 2021-03-14 ENCOUNTER — Encounter: Payer: Self-pay | Admitting: Gastroenterology

## 2021-03-14 ENCOUNTER — Ambulatory Visit: Payer: Managed Care, Other (non HMO) | Admitting: Gastroenterology

## 2021-03-14 VITALS — BP 110/80 | HR 79 | Ht 62.0 in | Wt 183.0 lb

## 2021-03-14 DIAGNOSIS — K219 Gastro-esophageal reflux disease without esophagitis: Secondary | ICD-10-CM

## 2021-03-14 DIAGNOSIS — R1012 Left upper quadrant pain: Secondary | ICD-10-CM

## 2021-03-14 MED ORDER — PANTOPRAZOLE SODIUM 40 MG PO TBEC
40.0000 mg | DELAYED_RELEASE_TABLET | Freq: Every day | ORAL | 4 refills | Status: DC
Start: 2021-03-14 — End: 2022-04-20

## 2021-03-14 MED ORDER — HYOSCYAMINE SULFATE 0.125 MG SL SUBL: 0.1250 mg | SUBLINGUAL_TABLET | Freq: Four times a day (QID) | SUBLINGUAL | 2 refills | Status: DC | PRN

## 2021-03-14 NOTE — Progress Notes (Signed)
03/14/2021 April Robinson [id number] 01/30/1961   HISTORY OF PRESENT ILLNESS: This is a 60 year old female who is a patient of Dr. Vena Rua.  She is here today with complaints of left upper quadrant abdominal pain.  She says that the pain is intermittent and she describes it as a sharp, cramping pain.  She says that it seems to be present mostly or worse when she is stressed and having a lot of anxiety.  There are times when she has no pain at all.  She admits to a lot of issues with her acid reflux.  Says that it is very severe at times.  She is on Dexilant 60 mg daily and has found that to be the most effective PPI over the years, but unfortunately her insurance no longer wants to cover that.  She is willing to try something else again.  She says that she is moving her bowels well without issues.  She denies any rectal bleeding.  Her last EGD was in 2012.  Her last colonoscopy was in December 2017 at which time she was found to have one 5 mm polyp that was removed and was a tubular adenoma on pathology.  She was referred here by her PCP, Dr. Theda Sers, on this occasion for complaints of left upper quadrant abdominal pain   Past Medical History:  Diagnosis Date  . Alcoholism ([place])   . Arthritis   . Cancer ([place])    breast L  . Esophageal reflux   . Hiatal hernia   . Hx of radiation therapy 08/27/11 to 10/15/11   L breast  . Hyperlipemia   . Hypertension    past not on any medications currently  . Personal history of colonic polyps    TUBULAR ADENOMA  . Personal history of radiation therapy    Past Surgical History:  Procedure Laterality Date  . ABDOMINAL HYSTERECTOMY     partial for DUB  . BREAST LUMPECTOMY  07/23/11   left breast  . COLONOSCOPY    . [place]    reports that she quit smoking about 39 years ago. Her smoking use included cigarettes. She has never used smokeless tobacco. She reports current alcohol use. She reports that she does not  use drugs. family history includes Arthritis in her brother; Breast cancer in her mother; Cancer in her brother, father, and mother; Colon cancer in her father; Diabetes in her father; Gastric cancer in her father and mother; Hyperlipidemia in her father and mother; Hypertension in her brother and father. Allergies  Allergen Reactions  . Meloxicam Shortness Of Breath and Nausea Only  . Miconazole Nitrate Other (See Comments)    SWELLING OF SOFT PALATE THAT CAUSED DIFFICULTY SWALLOWING  . Latex Itching, Rash and Other (See Comments)    Fine, little bumps      Outpatient Encounter Medications as of 03/14/2021  Medication Sig  . albuterol (PROVENTIL HFA;VENTOLIN HFA) 108 (90 Base) MCG/ACT inhaler Inhale 2 puffs into the lungs every 6 (six) hours as needed for wheezing or shortness of breath.  Marland Kitchen amLODipine (NORVASC) 2.5 MG tablet TAKE 1 TABLET BY MOUTH EVERY DAY  . hyoscyamine (LEVSIN SL) 0.125 MG SL tablet Place 1 tablet (0.125 mg total) under the tongue every 6 (six) hours as needed.  . Multiple Vitamin (MULTI-VITAMIN PO) Take by mouth daily.  . Omega-3 Fatty Acids (FISH OIL PO) Take by mouth daily.  Marland Kitchen OVER THE COUNTER MEDICATION daily.  . pantoprazole (PROTONIX) 40 MG tablet  Take 1 tablet (40 mg total) by mouth daily.  . tamoxifen (NOLVADEX) 20 MG tablet TAKE 1 TABLET BY MOUTH EVERY DAY  . valACYclovir (VALTREX) 500 MG tablet TAKE 1 TABLET BY MOUTH TWICE DAILY FOR 3-5 DAYS WITH OUTBREAK.  Marland Kitchen Dexlansoprazole (DEXILANT) 30 MG capsule Take 2 capsules (60 mg total) by mouth daily. (Patient not taking: Reported on 03/14/2021)   No facility-administered encounter medications on file as of 03/14/2021.     REVIEW OF SYSTEMS  : All other systems reviewed and negative except where noted in the History of Present Illness.   PHYSICAL EXAM: BP 110/80   Pulse 79   Ht 5\' 2"  (1.575 m)   Wt 183 lb (83 kg)   SpO2 97%   BMI 33.47 kg/m  General: Well developed AA female in no acute distress Head:  Normocephalic and atraumatic Eyes:  Sclerae anicteric, conjunctiva pink. Ears: Normal auditory acuity Lungs: Clear throughout to auscultation; no W/R/R. Heart: Regular rate and rhythm; no M/R/G. Abdomen: Soft, non-distended.  BS present.  Non-tender. Musculoskeletal: Symmetrical with no gross deformities  Skin: No lesions on visible extremities Extremities: No edema  Neurological: Alert oriented x 4, grossly non-focal Psychological:  Alert and cooperative. Normal mood and affect  ASSESSMENT AND PLAN: *LUQ abdominal pain and GERD: She describes the left upper quadrant abdominal pain as intermittent and crampy, worse with stress and anxiety.  Very likely could be IBS related/intestinal spasm/cramping.  She has had a lot of issues with her acid reflux recently as well, however.  Has not had an EGD since 2012.  We will plan for EGD with Dr. Hilarie Fredrickson.  In regards to her reflux, her Dexilant is no longer being covered by her insurance.  She has not had great success with other PPIs in the past.  She is willing to try something different again, however.  We will prescribe pantoprazole 40 mg daily.  We will try Levsin 0.125 mg as needed for the abdominal pain.  Prescription sent to pharmacy.  If EGD negative and pain persists then may need CT scan/cross-sectional imaging. *Personal history of colon polyps:  UTD. Is due for colonoscopy surveillance in December.   CC:  Jacelyn Pi, Irma M, *

## 2021-03-14 NOTE — Addendum Note (Signed)
Addended by: Alonza Bogus D on: 03/14/2021 01:59 PM   Modules accepted: Level of Service

## 2021-03-14 NOTE — Patient Instructions (Addendum)
If you are age 61 or older, your body mass index should be between 23-30. Your Body mass index is 33.47 kg/m. If this is out of the aforementioned range listed, please consider follow up with your Primary Care Provider.  If you are age 2 or younger, your body mass index should be between 19-25. Your Body mass index is 33.47 kg/m. If this is out of the aformentioned range listed, please consider follow up with your Primary Care Provider.   We have sent the following medications to your pharmacy for you to pick up at your convenience: Pantoprazole 40 mg daily, Levsin 0.125 mg   The [facility] providers would like to encourage you to use [facility] to communicate with providers for non-urgent requests or questions.  Due to long hold times on the telephone, sending your provider a message by [facility] may be a faster and more efficient way to get a response.  Please allow 48 business hours for a response.  Please remember that this is for non-urgent requests.   Due to recent changes in healthcare laws, you may see the results of your imaging and laboratory studies on MyChart before your provider has had a chance to review them.  We understand that in some cases there may be results that are confusing or concerning to you. Not all laboratory results come back in the same time frame and the provider may be waiting for multiple results in order to interpret others.  Please give [place] 48 hours in order for your provider to thoroughly review all the results before contacting the office for clarification of your results.   Thank you for choosing me and [facility].  Alonza Bogus, PA-C

## 2021-03-16 ENCOUNTER — Other Ambulatory Visit: Payer: Self-pay | Admitting: Oncology

## 2021-03-26 ENCOUNTER — Telehealth: Payer: Self-pay | Admitting: Gastroenterology

## 2021-03-26 NOTE — Telephone Encounter (Signed)
Before returning patients call I checked and saw that Prior authorization was still pending and called number provided via cover my meds. I called [employer] and did Prior Authorization over the phone. Prior authorization was approved  prior authorization number is [id number] .

## 2021-03-27 NOTE — Progress Notes (Signed)
Addendum: Reviewed and agree with assessment and management plan. Janaysha Depaulo M, MD  

## 2021-03-31 NOTE — Telephone Encounter (Signed)
Pt called stating that her pharmacy still has not received PA approval for Pantoprazole. Her pharmacy is [facility] on [address].

## 2021-03-31 NOTE — Telephone Encounter (Signed)
Returned patients call after contacting her pharmacy and [place]. [name] stated that prior authorization was approved for Quantity and not the actual medication. I called patient to let her know that I had to complete an appeal process and would call her once I had a response.

## 2021-04-02 ENCOUNTER — Telehealth: Payer: Self-pay

## 2021-04-02 NOTE — Telephone Encounter (Signed)
Called patient to let her know that I had the pharmacy to apply a Good Rx coupon so that he medication was $35 dollars so she would have it to take on her trip.

## 2021-04-25 ENCOUNTER — Encounter: Payer: Managed Care, Other (non HMO) | Admitting: Internal Medicine

## 2021-05-12 ENCOUNTER — Other Ambulatory Visit: Payer: Self-pay

## 2021-05-12 ENCOUNTER — Ambulatory Visit
Admission: RE | Admit: 2021-05-12 | Discharge: 2021-05-12 | Disposition: A | Discharge status: Home / Self Care | Payer: Managed Care, Other (non HMO) | Source: Ambulatory Visit | Attending: Oncology | Admitting: Oncology

## 2021-05-12 ENCOUNTER — Other Ambulatory Visit: Payer: Managed Care, Other (non HMO)

## 2021-05-12 ENCOUNTER — Ambulatory Visit: Payer: Managed Care, Other (non HMO) | Admitting: Oncology

## 2021-05-12 DIAGNOSIS — Z1231 Encounter for screening mammogram for malignant neoplasm of breast: Secondary | ICD-10-CM

## 2021-05-18 NOTE — Progress Notes (Signed)
ID: April Robinson   DOB: 30-Oct-1960  MR#: [id number]  OFB#:[id number]  Patient Care Team: Janie Morning, DO as PCP - General (Family Medicine) Julian Askin, Virgie Dad, MD as Consulting Physician (Hematology and Oncology) Pyrtle, Lajuan Lines, MD as Consulting Physician (Gastroenterology) OTHER MD:   CHIEF COMPLAINT:  Hx of Left Breast Cancer  CURRENT TREATMENT: Completing 10 years of tamoxifen    INTERVAL HISTORY:  April Robinson returns today for follow-up of her history of left breast cancer.    She continues on tamoxifen.  Aside from hot flashes she has done very well with this medication.  She is now ready to discontinue it as she approaches 10 years on it.  Since her last visit, she underwent bilateral screening mammography with tomography at [facility] on 05/12/2021 showing: breast density category B; no evidence of malignancy in either breast.    REVIEW OF SYSTEMS:  April Robinson has been very busy taking care of both her parents.  Her dad did die August 2021.  Her mother who lives in [place] has Alzheimer's disease and the family takes turns taking care of her in her home.  They do get some help from palliative care.  Because of this April Robinson is not exercising as much as she would like but she does get some dancing and and that is her preferred form of exercise.   COVID 19 VACCINATION STATUS: infection 10/2019, subsequently Moderna x3, most recently 08/2020     BREAST CANCER HISTORY: From the original intake note:  The patient had a questionable palpable finding in the left breast which was evaluated in July 2009 and it was recommended that she return for further evaluation but actually did not show.  She had bilateral diagnostic mammography in March 2011 which showed minimal duct ectasia of the left breast at the location in question.  Again, 26-monthinterval mammography was recommended and this was repeated August 2012.  There was minimally a more prominent ductal ectasia and MRI was  recommended for further evaluation.  This was performed on June 22, 2011, and it showed a 1.2 cm enhancing mass in the posterior aspect of an area of irregular linear enhancement.  Anteriorly to this, there was a 6 mm area which is the area of dilated duct noted by prior evaluation.   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 The tumor was estrogen receptor positive at 72%, progesterone receptor positive at 5%, with a very low MIB1 at 4%.  HER2 was not amplified with a ratio by [facility] of 1.10.   The patient's subsequent history is as detailed below.   PAST MEDICAL HISTORY: Past Medical History:  Diagnosis Date   Alcoholism ([place]    Arthritis    Cancer ([place]    breast L   Esophageal reflux    Hiatal hernia    Hx of radiation therapy 08/27/11 to 10/15/11   L breast   Hyperlipemia    Hypertension    past not on any medications currently   Personal history of colonic polyps    TUBULAR ADENOMA   Personal history of radiation therapy     PAST SURGICAL HISTORY: Past Surgical History:  Procedure Laterality Date   ABDOMINAL HYSTERECTOMY     partial for DUB   BREAST LUMPECTOMY  07/23/11   left breast   COLONOSCOPY  LAPAROSCOPIC HYSTERECTOMY  1995  no BSO, still has both ovaries   FAMILY HISTORY Family History  Problem Relation Age of Onset   Breast cancer Mother    Cancer Mother        breast   Hyperlipidemia Mother    Gastric cancer Mother        Gallbladder cancer   Diabetes Father    Hypertension Father    Cancer Father    Hyperlipidemia Father    Colon cancer Father    Gastric cancer Father    Cancer Brother        NEUROENDOCRINE   Hypertension Brother    Arthritis Brother        hips  The patient's father is alive at age 19. The patient's father has breast cancer. The patient's  mother is alive at age 38.  She was diagnosed with breast cancer at age 6.  The patient had 2 brothers; one of them died from a neuroendocrine tumor.  She has 1 sister.  There is no other breast or ovarian cancer in the immediate family.   GYNECOLOGIC HISTORY:  (Reviewed 02/19/2014) She had menarche, age 6. She is GX P3 with first live birth at age 30. She had a hysterectomy in 1993, but did not have her ovaries removed.  She took hormone replacement for at least 2 years.     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  She quit her job in June 2021 because it was causing her too much stress.  Her husband, Saphyre Cillo, works as a Administrator.  The patient's children from a prior marriage are:  [name], 34, who lives in [place] and works as a Production assistant, radio.  [place] who works as a Pharmacist, hospital of ages between 87 and 12; she is also a Presenter, broadcasting.  [place]ina [place] Quivira, 29, who studies accounting at [employer]. The patient has 8 grandchildren. She is very active in her local church    ADVANCED DIRECTIVES: Not in place   HEALTH MAINTENANCE:  Social History   Tobacco Use   Smoking status: Former    Types: Cigarettes    Quit date: 1983    Years since quitting: 39.5   Smokeless tobacco: Never  Vaping Use   Vaping Use: Never used  Substance Use Topics   Alcohol use: Yes    Alcohol/week: 0.0 standard drinks    Comment: rare   Drug use: No     Colonoscopy: 10/02/2016/ normal/ Dr. Raquel James  PAP:  [facility], Dr. Toney Rakes  Bone density:  09/18/2013, normal  Lipid panel:  [facility], Dr. Etter Sjogren   Allergies  Allergen Reactions   Meloxicam Shortness Of Breath and Nausea Only   Miconazole Nitrate Other (See Comments)    SWELLING OF SOFT PALATE THAT CAUSED DIFFICULTY SWALLOWING   Latex Itching, Rash and Other (See Comments)    Fine, little bumps    Current  Outpatient Medications  Medication Sig Dispense Refill   albuterol (PROVENTIL HFA;VENTOLIN HFA) 108 (90 Base) MCG/ACT inhaler Inhale 2 puffs into the lungs every 6 (six) hours as needed for wheezing or shortness of breath. 1 Inhaler 0   amLODipine (NORVASC) 2.5 MG tablet TAKE 1 TABLET BY MOUTH EVERY DAY 90 tablet 1   Dexlansoprazole (DEXILANT) 30 MG capsule Take 2 capsules (60 mg total) by mouth daily. (Patient not taking: Reported on 03/14/2021) 120 capsule 0  hyoscyamine (LEVSIN SL) 0.125 MG SL tablet Place 1 tablet (0.125 mg total) under the tongue every 6 (six) hours as needed. 30 tablet 2   Multiple Vitamin (MULTI-VITAMIN PO) Take by mouth daily.     Omega-3 Fatty Acids (FISH OIL PO) Take by mouth daily.     OVER THE COUNTER MEDICATION daily.     pantoprazole (PROTONIX) 40 MG tablet Take 1 tablet (40 mg total) by mouth daily. 90 tablet 4   tamoxifen (NOLVADEX) 20 MG tablet TAKE 1 TABLET BY MOUTH EVERY DAY 90 tablet 0   valACYclovir (VALTREX) 500 MG tablet TAKE 1 TABLET BY MOUTH TWICE DAILY FOR 3-5 DAYS WITH OUTBREAK. 10 tablet 2   No current facility-administered medications for this visit.    OBJECTIVE:  African American woman who appears stated age  60:   05/19/21 1109  BP: (!) 142/75  Pulse: 67  Resp: 18  Temp: 97.7 F (36.5 C)  SpO2: 100%      Body mass index is 33.93 kg/m.    ECOG FS: 1 Filed Weights   05/19/21 1109  Weight: 185 lb 8 oz (84.1 kg)    Sclerae unicteric, EOMs intact Wearing a mask No cervical or supraclavicular adenopathy Lungs no rales or rhonchi Heart regular rate and rhythm Abd soft, nontender, positive bowel sounds MSK no focal spinal tenderness, no upper extremity lymphedema Neuro: nonfocal, well oriented, appropriate affect Breasts: The right breast is unremarkable.  The left breast has undergone lumpectomy followed by radiation.  There is the usual slight coarsening of the skin but no evidence of local recurrence.  Both axillae are  benign.   LAB RESULTS: Lab Results  Component Value Date   WBC 4.9 05/19/2021   NEUTROABS 2.5 05/19/2021   HGB 14.0 05/19/2021   HCT 41.4 05/19/2021   MCV 85.7 05/19/2021   PLT 224 05/19/2021      Chemistry      Component Value Date/Time   NA 143 05/09/2020 1300   NA 144 12/29/2019 1542   NA 143 09/03/2016 0830   K 3.5 05/09/2020 1300   K 4.0 09/03/2016 0830   CL 110 05/09/2020 1300   CL 107 01/30/2013 1509   CO2 26 05/09/2020 1300   CO2 26 09/03/2016 0830   BUN 15 05/09/2020 1300   BUN 10 12/29/2019 1542   BUN 14.6 09/03/2016 0830   CREATININE 1.05 (H) 05/09/2020 1300   CREATININE 1.1 09/03/2016 0830      Component Value Date/Time   CALCIUM 8.8 (L) 05/09/2020 1300   CALCIUM 9.3 09/03/2016 0830   ALKPHOS 71 05/09/2020 1300   ALKPHOS 67 09/03/2016 0830   AST 13 (L) 05/09/2020 1300   AST 21 09/03/2016 0830   ALT 14 05/09/2020 1300   ALT 22 09/03/2016 0830   BILITOT 0.4 05/09/2020 1300   BILITOT 0.4 03/17/2019 1427   BILITOT 0.51 09/03/2016 0830      STUDIES: MM 3D SCREEN BREAST BILATERAL  Result Date: 05/13/2021 CLINICAL DATA:  Screening. EXAM: DIGITAL SCREENING BILATERAL MAMMOGRAM WITH TOMOSYNTHESIS AND CAD TECHNIQUE: Bilateral screening digital craniocaudal and mediolateral oblique mammograms were obtained. Bilateral screening digital breast tomosynthesis was performed. The images were evaluated with computer-aided detection. COMPARISON:  Previous exam(s). ACR Breast Density Category b: There are scattered areas of fibroglandular density. FINDINGS: There are no findings suspicious for malignancy. IMPRESSION: No mammographic evidence of malignancy. A result letter of this screening mammogram will be mailed directly to the patient. RECOMMENDATION: Screening mammogram in one year. (Code:SM-B-01Y) BI-RADS CATEGORY  1: Negative. Electronically Signed   By: Everlean Alstrom M.D.   On: 05/13/2021 10:58     ASSESSMENT: 60 y.o.  [place] woman   (1)  status post left  lumpectomy and sentinel lymph node biopsy September of 2012 for a T1c N0, Stage IA  invasive ductal carcinoma, grade 2, estrogen and progesterone receptor positive with a very low MIB-1 and no HER-2 amplification.   (2)  She completed adjuvant radiation 10/15/2011   (3) started tamoxifen 10/27/2011, completing 10 years late 2022  (4) s/p remote TAH, with ovaries still intact    PLAN:  April Robinson is now just about 10 years out from definitive surgery for her breast cancer with no evidence of disease recurrence.  This is very favorable.  She will complete 10 years of tamoxifen later this year.  She will simply take the supply that she still has on hand and that will carry her through.  She does not need to taper the medication to off.  We reviewed her breast mammography report which is very favorable and shows her breasts are not dense.  This means that the mammograms are sensitive and informative  As far as breast cancer follow-up is concerned all she will need is yearly screening mammography and a yearly physician breast exam.  I will be glad to see April Robinson again at any point in the future if and when the need arises but as of now are making no further routine appointments for her here.  Total encounter time 25 minutes.*   Joniya Boberg, Virgie Dad, MD  05/19/21 11:29 AM Medical Oncology and Hematology [facility] [place], [place] [address] Tel. [phone]    Fax. [phone]    I, Wilburn Mylar, am acting as scribe for Dr. Virgie Dad. April Robinson.  I, Lurline Del MD, have reviewed the above documentation for accuracy and completeness, and I agree with the above.    *Total Encounter Time as defined by the Centers for Medicare and Medicaid Services includes, in addition to the face-to-face time of a patient visit (documented in the note above) non-face-to-face time: obtaining and reviewing outside history, ordering and reviewing medications, tests or  procedures, care coordination (communications with other health care professionals or caregivers) and documentation in the medical record.

## 2021-05-19 ENCOUNTER — Other Ambulatory Visit: Payer: Self-pay

## 2021-05-19 ENCOUNTER — Other Ambulatory Visit: Payer: Self-pay | Admitting: *Deleted

## 2021-05-19 ENCOUNTER — Inpatient Hospital Stay: Payer: Managed Care, Other (non HMO) | Admitting: Oncology

## 2021-05-19 ENCOUNTER — Inpatient Hospital Stay: Payer: Managed Care, Other (non HMO) | Attending: Oncology

## 2021-05-19 VITALS — BP 142/75 | HR 67 | Temp 97.7°F | Resp 18 | Ht 62.0 in | Wt 185.5 lb

## 2021-05-19 DIAGNOSIS — C50412 Malignant neoplasm of upper-outer quadrant of left female breast: Secondary | ICD-10-CM | POA: Diagnosis not present

## 2021-05-19 DIAGNOSIS — Z923 Personal history of irradiation: Secondary | ICD-10-CM | POA: Diagnosis not present

## 2021-05-19 DIAGNOSIS — Z79810 Long term (current) use of selective estrogen receptor modulators (SERMs): Secondary | ICD-10-CM | POA: Diagnosis not present

## 2021-05-19 DIAGNOSIS — R232 Flushing: Secondary | ICD-10-CM | POA: Diagnosis not present

## 2021-05-19 DIAGNOSIS — Z170 Estrogen receptor positive status [ER+]: Secondary | ICD-10-CM

## 2021-05-19 LAB — CMP (CANCER CENTER ONLY)
ALT: 17 U/L (ref 0–44)
AST: 16 U/L (ref 15–41)
Albumin: 3.8 g/dL (ref 3.5–5.0)
Alkaline Phosphatase: 59 U/L (ref 38–126)
Anion gap: 6 (ref 5–15)
BUN: 15 mg/dL (ref 8–23)
CO2: 29 mmol/L (ref 22–32)
Calcium: 9.2 mg/dL (ref 8.9–10.3)
Chloride: 107 mmol/L (ref 98–111)
Creatinine: 1.11 mg/dL — ABNORMAL HIGH (ref 0.44–1.00)
GFR, Estimated: 57 mL/min — ABNORMAL LOW (ref >60)
Glucose, Bld: 93 mg/dL (ref 70–99)
Potassium: 4.3 mmol/L (ref 3.5–5.1)
Sodium: 142 mmol/L (ref 135–145)
Total Bilirubin: 0.5 mg/dL (ref 0.3–1.2)
Total Protein: 6.9 g/dL (ref 6.5–8.1)

## 2021-05-19 LAB — CBC WITH DIFFERENTIAL (CANCER CENTER ONLY)
Abs Immature Granulocytes: 0.01 10*3/uL (ref 0.00–0.07)
Basophils Absolute: 0 10*3/uL (ref 0.0–0.1)
Basophils Relative: 1 %
Eosinophils Absolute: 0.1 10*3/uL (ref 0.0–0.5)
Eosinophils Relative: 1 %
HCT: 41.4 % (ref 36.0–46.0)
Hemoglobin: 14 g/dL (ref 12.0–15.0)
Immature Granulocytes: 0 %
Lymphocytes Relative: 40 %
Lymphs Abs: 1.9 10*3/uL (ref 0.7–4.0)
MCH: 29 pg (ref 26.0–34.0)
MCHC: 33.8 g/dL (ref 30.0–36.0)
MCV: 85.7 fL (ref 80.0–100.0)
Monocytes Absolute: 0.4 10*3/uL (ref 0.1–1.0)
Monocytes Relative: 7 %
Neutro Abs: 2.5 10*3/uL (ref 1.7–7.7)
Neutrophils Relative %: 51 %
Platelet Count: 224 10*3/uL (ref 150–400)
RBC: 4.83 MIL/uL (ref 3.87–5.11)
RDW: 13.7 % (ref 11.5–15.5)
WBC Count: 4.9 10*3/uL (ref 4.0–10.5)
nRBC: 0 % (ref 0.0–0.2)

## 2021-05-22 ENCOUNTER — Telehealth: Payer: Self-pay | Admitting: *Deleted

## 2021-05-22 NOTE — Telephone Encounter (Signed)
Called patient to discuss EGD instructions. Patient advised that insurance would not cover procedure based on how it was coded. Patient wanted to know what needs to be done to have this procedure covered. Please advise.

## 2021-06-13 ENCOUNTER — Other Ambulatory Visit: Payer: Self-pay | Admitting: Oncology

## 2021-06-19 ENCOUNTER — Encounter: Payer: Managed Care, Other (non HMO) | Admitting: Internal Medicine

## 2021-07-01 ENCOUNTER — Other Ambulatory Visit: Payer: Self-pay

## 2021-07-01 ENCOUNTER — Ambulatory Visit
Admission: EM | Admit: 2021-07-01 | Discharge: 2021-07-01 | Disposition: A | Discharge status: Home / Self Care | Payer: Managed Care, Other (non HMO) | Attending: Internal Medicine | Admitting: Internal Medicine

## 2021-07-01 DIAGNOSIS — S29019A Strain of muscle and tendon of unspecified wall of thorax, initial encounter: Secondary | ICD-10-CM

## 2021-07-01 DIAGNOSIS — M546 Pain in thoracic spine: Secondary | ICD-10-CM

## 2021-07-01 MED ORDER — KETOROLAC TROMETHAMINE 60 MG/2ML IM SOLN
30.0000 mg | Freq: Once | INTRAMUSCULAR | Status: AC
  Administered 2021-07-01: 30 mg via INTRAMUSCULAR
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
## 2021-07-01 NOTE — Discharge Instructions (Addendum)
You were given ketorolac injection in urgent care today to decrease pain and inflammation.  Please avoid any ibuprofen, Advil, Aleve for at least 24 hours following injection.  After this 24 hours, you may alternate ibuprofen and Tylenol as needed for pain.  Also alternate ice and heat application.  Follow-up with provided contact information for orthopedist if pain persists over the next 1.5 to 2 weeks.
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
## 2021-08-27 ENCOUNTER — Encounter: Payer: Self-pay | Admitting: Internal Medicine

## 2021-10-28 ENCOUNTER — Ambulatory Visit (AMBULATORY_SURGERY_CENTER): Payer: Self-pay | Admitting: *Deleted

## 2021-10-28 ENCOUNTER — Other Ambulatory Visit: Payer: Self-pay

## 2021-10-28 VITALS — Ht 62.0 in | Wt 189.4 lb

## 2021-10-28 DIAGNOSIS — H269 Unspecified cataract: Secondary | ICD-10-CM | POA: Insufficient documentation

## 2021-10-28 DIAGNOSIS — Z86010 Personal history of colonic polyps: Secondary | ICD-10-CM

## 2021-10-28 MED ORDER — ONDANSETRON HCL 4 MG PO TABS: ORAL_TABLET | ORAL | 0 refills | Status: DC

## 2021-10-28 MED ORDER — NA SULFATE-K SULFATE-MG SULF 17.5-3.13-1.6 GM/177ML PO SOLN
1.0000 | Freq: Once | ORAL | 0 refills | Status: AC
Start: 2021-10-28 — End: 2021-10-28

## 2021-10-28 NOTE — Progress Notes (Signed)
No egg or soy allergy known to patient  No issues known to pt with past sedation with any surgeries or procedures Patient denies ever being told they had issues or difficulty with intubation  No FH of Malignant Hyperthermia Pt is not on diet pills Pt is not on  home 02  Pt is not on blood thinners  Pt denies issues with constipation  No A fib or A flutter  Pt is fully vaccinated  for Covid   Discussed with pt there will be an out-of-pocket cost for prep and that varies from $0 to 70 +  dollars - pt verbalized understanding   Due to the COVID-19 pandemic we are asking patients to follow certain guidelines in [place] and the [place]   Pt aware of COVID protocols and [facility] guidelines    Pt. Stated "I get sick from drinking that" zofran rx given.

## 2021-11-05 ENCOUNTER — Encounter: Payer: Self-pay | Admitting: Internal Medicine

## 2021-11-07 ENCOUNTER — Encounter: Payer: Self-pay | Admitting: Emergency Medicine

## 2021-11-07 ENCOUNTER — Other Ambulatory Visit: Payer: Self-pay

## 2021-11-07 ENCOUNTER — Ambulatory Visit
Admission: EM | Admit: 2021-11-07 | Discharge: 2021-11-07 | Disposition: A | Discharge status: Home / Self Care | Payer: Managed Care, Other (non HMO) | Attending: Internal Medicine | Admitting: Internal Medicine

## 2021-11-07 ENCOUNTER — Ambulatory Visit
Admission: EM | Admit: 2021-11-07 | Discharge: 2021-11-07 | Discharge status: Absent without leave | Payer: Managed Care, Other (non HMO)

## 2021-11-07 DIAGNOSIS — R0789 Other chest pain: Secondary | ICD-10-CM

## 2021-11-07 NOTE — ED Provider Notes (Signed)
[facility]    CSN: [id number] Arrival date & time: 11/07/21  0807      History   Chief Complaint Chief Complaint  Patient presents with   Chest Pain    HPI April Robinson is a 61 y.o. female.   Patient presents with chest pain that has been present intermittent over the past 3 weeks.  Patient reports that she thinks it is musculoskeletal in nature as she has to take care of her mother who needs full assistance and thinks that she may have pulled a muscle in her chest lifting her.  The chest pain worsened last night when she picked up her great-grandson.  Denies shortness of breath, headache, nausea, vomiting, dizziness.  Pain is worsened with movement.  The pain does radiate into left shoulder and slightly down left arm.  Denies any numbness or tingling.  Patient does not have any history of cardiac problems.  Has not taken any medications to help alleviate pain.  Patient denies that it is cardiac as she did an exercise class and chest pain did not worsen with this.   Chest Pain  Past Medical History:  Diagnosis Date   Alcoholism ([place])    Arthritis    Blood transfusion without reported diagnosis    "40 years ago"   Cancer [facility])    breast L   Cataract    bilateral "starting"   Esophageal reflux    Hiatal hernia    Hx of radiation therapy 08/27/11 to 10/15/11   L breast   Hyperlipemia    Hypertension    past not on any medications currently   Personal history of colonic polyps    TUBULAR ADENOMA   Personal history of radiation therapy     Patient Active Problem List   Diagnosis Date Noted   Cataract 10/28/2021   Suspected COVID-19 virus infection 11/07/2019   Cough 12/08/2018   Tendonitis, Achilles, right 08/05/2017   Left knee pain 04/23/2015   Knee pain, left 04/11/2015   Popliteal pain 04/11/2015   Hypertension 10/17/2014   Acute pharyngitis 09/04/2014   Left shoulder pain 02/19/2014   Benign paroxysmal positional vertigo 09/29/2013    Hot flashes due to tamoxifen 08/16/2013   Hx of vaginitis 04/15/2012   De Quervain's disease (tenosynovitis) 04/05/2012   Malignant neoplasm of upper-outer quadrant of left breast in female, estrogen receptor positive ([place])    LUQ abdominal pain 12/16/2010   IBS 12/03/2010   ELEVATED BP READING WITHOUT DX HYPERTENSION 12/03/2010   NECK SPRAIN AND STRAIN 12/03/2010   ANXIETY STATE, UNSPECIFIED 11/07/2010   ADD 11/07/2010   GERD 11/07/2010   CHEST PAIN UNSPECIFIED 11/07/2010    Past Surgical History:  Procedure Laterality Date   ABDOMINAL HYSTERECTOMY     partial for DUB   BREAST LUMPECTOMY  07/23/2011   left breast   COLONOSCOPY     LAPAROSCOPIC HYSTERECTOMY  1995   POLYPECTOMY      OB History     Gravida  3   Para  3   Term      Preterm      AB      Living  3      SAB      IAB      Ectopic      Multiple      Live Births               Home Medications    Prior to Admission medications   Medication  Sig Start Date End Date Taking? Authorizing Provider  amLODipine (NORVASC) 2.5 MG tablet TAKE 1 TABLET BY MOUTH EVERY DAY 05/31/20  Yes Jacelyn Pi, Lilia Argue, MD  hyoscyamine (LEVSIN SL) 0.125 MG SL tablet Place 1 tablet (0.125 mg total) under the tongue every 6 (six) hours as needed. 03/14/21  Yes Zehr, Laban Emperor, PA-C  ondansetron (ZOFRAN) 4 MG tablet Take 1 tablet 30-60 minutes prior to each prep dose. 10/28/21  Yes Pyrtle, Lajuan Lines, MD  pantoprazole (PROTONIX) 40 MG tablet Take 1 tablet (40 mg total) by mouth daily. 03/14/21  Yes Zehr, Laban Emperor, PA-C  albuterol (PROVENTIL HFA;VENTOLIN HFA) 108 (90 Base) MCG/ACT inhaler Inhale 2 puffs into the lungs every 6 (six) hours as needed for wheezing or shortness of breath. Patient not taking: Reported on 10/28/2021 10/12/17   Jacelyn Pi, Lilia Argue, MD  Dexlansoprazole (DEXILANT) 30 MG capsule Take 2 capsules (60 mg total) by mouth daily. Patient not taking: Reported on 03/14/2021 01/30/21 03/31/21  Wieters, Madelynn Done C, PA-C   Multiple Vitamin (MULTI-VITAMIN PO) Take by mouth daily. Patient not taking: Reported on 10/28/2021    [provider]  Omega-3 Fatty Acids (FISH OIL PO) Take by mouth daily. Patient not taking: Reported on 10/28/2021    [provider]  OVER THE COUNTER MEDICATION daily. Patient not taking: Reported on 10/28/2021    [provider]  tamoxifen (NOLVADEX) 20 MG tablet TAKE 1 TABLET BY MOUTH EVERY DAY Patient not taking: Reported on 10/28/2021 06/13/21   Magrinat, Virgie Dad, MD  valACYclovir (VALTREX) 500 MG tablet TAKE 1 TABLET BY MOUTH TWICE DAILY FOR 3-5 DAYS WITH OUTBREAK. Patient not taking: Reported on 10/28/2021 06/07/20   Jacelyn Pi, Lilia Argue, MD    Family History Family History  Problem Relation Age of Onset   Breast cancer Mother    Cancer Mother        breast   Hyperlipidemia Mother    Gastric cancer Mother        Gallbladder cancer   Diabetes Father    Hypertension Father    Cancer Father    Hyperlipidemia Father    Colon cancer Father    Gastric cancer Father    Cancer Brother        NEUROENDOCRINE   Hypertension Brother    Arthritis Brother        hips    Social History Social History   Tobacco Use   Smoking status: Former    Types: Cigarettes    Quit date: 1983    Years since quitting: 40.0   Smokeless tobacco: Never  Vaping Use   Vaping Use: Never used  Substance Use Topics   Alcohol use: Yes    Alcohol/week: 0.0 standard drinks    Comment: rare   Drug use: No     Allergies   Meloxicam, Miconazole nitrate, and Latex   Review of Systems Review of Systems Per HPI  Physical Exam Triage Vital Signs ED Triage Vitals  Enc Vitals Group     BP 11/07/21 0815 128/85     Pulse Rate 11/07/21 0815 77     Resp 11/07/21 0815 18     Temp 11/07/21 0815 97.7 F (36.5 C)     Temp Source 11/07/21 0815 Oral     SpO2 11/07/21 0815 98 %     Weight 11/07/21 0817 185 lb (83.9 kg)     Height 11/07/21 0817 5\' 2"  (1.575 m)     Head  Circumference --  Peak Flow --      Pain Score 11/07/21 0817 7     Pain Loc --      Pain Edu? --      Excl. in [place]? --    No data found.  Updated Vital Signs BP 128/85 (BP Location: Right Arm)    Pulse 77    Temp 97.7 F (36.5 C) (Oral)    Resp 18    Ht 5\' 2"  (1.575 m)    Wt 185 lb (83.9 kg)    SpO2 98%    BMI 33.84 kg/m   Visual Acuity Right Eye Distance:   Left Eye Distance:   Bilateral Distance:    Right Eye Near:   Left Eye Near:    Bilateral Near:     Physical Exam Constitutional:      General: She is not in acute distress.    Appearance: Normal appearance. She is not toxic-appearing or diaphoretic.  HENT:     Head: Normocephalic and atraumatic.  Eyes:     Extraocular Movements: Extraocular movements intact.     Conjunctiva/sclera: Conjunctivae normal.  Cardiovascular:     Rate and Rhythm: Normal rate and regular rhythm.     Pulses: Normal pulses.     Heart sounds: Normal heart sounds.  Pulmonary:     Effort: Pulmonary effort is normal. No respiratory distress.     Breath sounds: Normal breath sounds.  Chest:     Chest wall: Tenderness present.     Comments: No tenderness to palpation to left chest or left shoulder.  Patient does have tenderness to left chest that extends into left shoulder with range of motion of the left upper extremity.  Neurovascular intact to left upper extremity.  Grip strength 5/5. Neurological:     General: No focal deficit present.     Mental Status: She is alert and oriented to person, place, and time. Mental status is at baseline.     Cranial Nerves: Cranial nerves 2-12 are intact.     Sensory: Sensation is intact.     Motor: Motor function is intact.     Coordination: Coordination is intact.     Gait: Gait is intact.  Psychiatric:        Mood and Affect: Mood normal.        Behavior: Behavior normal.        Thought Content: Thought content normal.        Judgment: Judgment normal.     UC Treatments / Results  Labs (all  labs ordered are listed, but only abnormal results are displayed) Labs Reviewed - No data to display  EKG   Radiology No results found.  Procedures Procedures (including critical care time)  Medications Ordered in UC Medications - No data to display  Initial Impression / Assessment and Plan / UC Course  I have reviewed the triage vital signs and the nursing notes.  Pertinent labs & imaging results that were available during my care of the patient were reviewed by me and considered in my medical decision making (see chart for details).     EKG was normal sinus rhythm.  Vital signs are stable.  Chest pain appears musculoskeletal in nature as it is reproducible with range of motion.  Do not think that patient is in need of immediate medical attention at the hospital at this time for further evaluation of chest pain as it appears musculoskeletal.  Discussed supportive care with patient.  Patient may take Tylenol as patient  had elevated kidney levels on previous blood work and is to avoid NSAIDs.  Ice medication as well.  Discussed strict ER precautions.  Advised patient to go to the hospital if chest pain does not improve or if it worsens.  Patient verbalized understanding and was agreeable with plan. Final Clinical Impressions(s) / UC Diagnoses   Final diagnoses:  Other chest pain     Discharge Instructions      Your chest pain appears musculoskeletal in nature.  Please use Tylenol and ice application as needed.  Go to the hospital if symptoms do not improve or if they worsen.    ED Prescriptions   None    PDMP not reviewed this encounter.   Teodora Medici, [place] 11/07/21 [phone]

## 2021-11-07 NOTE — ED Triage Notes (Signed)
Patient feels she has pulled a muscle in her chest from lifting her great grandson last night.  Patient is having some pain in her left shoulder to her back.  No numbness or pain in extremities.  Patient denies any OTC meds.  No cardiac history.

## 2021-11-07 NOTE — Discharge Instructions (Addendum)
Your chest pain appears musculoskeletal in nature.  Please use Tylenol and ice application as needed.  Go to the hospital if symptoms do not improve or if they worsen.

## 2021-11-11 ENCOUNTER — Other Ambulatory Visit: Payer: Self-pay

## 2021-11-11 ENCOUNTER — Encounter: Payer: Self-pay | Admitting: Internal Medicine

## 2021-11-11 ENCOUNTER — Ambulatory Visit (AMBULATORY_SURGERY_CENTER): Payer: Managed Care, Other (non HMO) | Admitting: Internal Medicine

## 2021-11-11 VITALS — BP 168/80 | HR 69 | Temp 97.5°F | Resp 16 | Ht 62.0 in | Wt 189.4 lb

## 2021-11-11 DIAGNOSIS — Z86010 Personal history of colonic polyps: Secondary | ICD-10-CM | POA: Diagnosis present

## 2021-11-11 DIAGNOSIS — D123 Benign neoplasm of transverse colon: Secondary | ICD-10-CM | POA: Diagnosis not present

## 2021-11-11 MED ORDER — SODIUM CHLORIDE 0.9 % IV SOLN: 500.0000 mL | Freq: Once | INTRAVENOUS | Status: DC

## 2021-11-11 NOTE — Progress Notes (Signed)
PT taken to PACU. Monitors in place. VSS. Report given to RN. 

## 2021-11-11 NOTE — Op Note (Signed)
[place] Patient Name: April Robinson Procedure Date: 11/11/2021 11:23 AM MRN: [id number] Endoscopist: Jerene Bears , MD Age: 61 Referring MD:  Date of Birth: [birth year] Gender: Female Account #: [id number] Procedure:                Colonoscopy Indications:              High risk colon cancer surveillance: Personal                            history of non-advanced adenoma, Last colonoscopy:                            December 2017 Medicines:                Monitored Anesthesia Care Procedure:                Pre-Anesthesia Assessment:                           - Prior to the procedure, a History and Physical                            was performed, and patient medications and                            allergies were reviewed. The patient's tolerance of                            previous anesthesia was also reviewed. The risks                            and benefits of the procedure and the sedation                            options and risks were discussed with the patient.                            All questions were answered, and informed consent                            was obtained. Prior Anticoagulants: The patient has                            taken no previous anticoagulant or antiplatelet                            agents. ASA Grade Assessment: II - A patient with                            mild systemic disease. After reviewing the risks                            and benefits, the patient was deemed in  satisfactory condition to undergo the procedure.                           After obtaining informed consent, the colonoscope                            was passed under direct vision. Throughout the                            procedure, the patient's blood pressure, pulse, and                            oxygen saturations were monitored continuously. The                            CF [id number] #[id number] was introduced through the anus                             and advanced to the cecum, identified by                            appendiceal orifice and ileocecal valve. The                            colonoscopy was performed without difficulty. The                            patient tolerated the procedure well. The quality                            of the bowel preparation was good. The ileocecal                            valve, appendiceal orifice, and rectum were                            photographed. Scope In: 11:32:58 AM Scope Out: 11:44:48 AM Scope Withdrawal Time: 0 hours 7 minutes 59 seconds  Total Procedure Duration: 0 hours 11 minutes 50 seconds  Findings:                 The digital rectal exam was normal.                           A 5 mm polyp was found in the transverse colon. The                            polyp was sessile. The polyp was removed with a                            cold snare. Resection and retrieval were complete.                           The exam was otherwise without abnormality on  direct and retroflexion views. Complications:            No immediate complications. Estimated Blood Loss:     Estimated blood loss was minimal. Impression:               - One 5 mm polyp in the transverse colon, removed                            with a cold snare. Resected and retrieved.                           - The examination was otherwise normal on direct                            and retroflexion views. Recommendation:           - Patient has a contact number available for                            emergencies. The signs and symptoms of potential                            delayed complications were discussed with the                            patient. Return to normal activities tomorrow.                            Written discharge instructions were provided to the                            patient.                           - Resume previous diet.                            - Continue present medications.                           - Await pathology results.                           - Repeat colonoscopy is recommended for                            surveillance. The colonoscopy date will be                            determined after pathology results from today's                            exam become available for review. Jerene Bears, MD 11/11/2021 11:46:27 AM This report has been signed electronically.

## 2021-11-11 NOTE — Progress Notes (Signed)
Called to room to assist during endoscopic procedure.  Patient ID and intended procedure confirmed with present staff. Received instructions for my participation in the procedure from the performing physician.  

## 2021-11-11 NOTE — Patient Instructions (Signed)
Handouts given for polyps.  YOU HAD AN ENDOSCOPIC PROCEDURE TODAY AT THE [facility]:   Refer to the procedure report that was given to you for any specific questions about what was found during the examination.  If the procedure report does not answer your questions, please call your gastroenterologist to clarify.  If you requested that your care partner not be given the details of your procedure findings, then the procedure report has been included in a sealed envelope for you to review at your convenience later.  YOU SHOULD EXPECT: Some feelings of bloating in the abdomen. Passage of more gas than usual.  Walking can help get rid of the air that was put into your GI tract during the procedure and reduce the bloating. If you had a lower endoscopy (such as a colonoscopy or flexible sigmoidoscopy) you may notice spotting of blood in your stool or on the toilet paper. If you underwent a bowel prep for your procedure, you may not have a normal bowel movement for a few days.  Please Note:  You might notice some irritation and congestion in your nose or some drainage.  This is from the oxygen used during your procedure.  There is no need for concern and it should clear up in a day or so.  SYMPTOMS TO REPORT IMMEDIATELY:  Following lower endoscopy (colonoscopy or flexible sigmoidoscopy):  Excessive amounts of blood in the stool  Significant tenderness or worsening of abdominal pains  Swelling of the abdomen that is new, acute  Fever of 100F or higher  For urgent or emergent issues, a gastroenterologist can be reached at any hour by calling [phone]. Do not use MyChart messaging for urgent concerns.    DIET:  We do recommend a small meal at first, but then you may proceed to your regular diet.  Drink plenty of fluids but you should avoid alcoholic beverages for 24 hours.  ACTIVITY:  You should plan to take it easy for the rest of today and you should NOT DRIVE or use heavy  machinery until tomorrow (because of the sedation medicines used during the test).    FOLLOW UP: Our staff will call the number listed on your records 48-72 hours following your procedure to check on you and address any questions or concerns that you may have regarding the information given to you following your procedure. If we do not reach you, we will leave a message.  We will attempt to reach you two times.  During this call, we will ask if you have developed any symptoms of COVID 19. If you develop any symptoms (ie: fever, flu-like symptoms, shortness of breath, cough etc.) before then, please call [phone].  If you test positive for Covid 19 in the 2 weeks post procedure, please call and report this information to us.    If any biopsies were taken you will be contacted by phone or by letter within the next 1-3 weeks.  Please call us at [phone] if you have not heard about the biopsies in 3 weeks.    SIGNATURES/CONFIDENTIALITY: You and/or your care partner have signed paperwork which will be entered into your electronic medical record.  These signatures attest to the fact that that the information above on your After Visit Summary has been reviewed and is understood.  Full responsibility of the confidentiality of this discharge information lies with you and/or your care-partner.  

## 2021-11-11 NOTE — Progress Notes (Signed)
Pt's states no medical or surgical changes since previsit or office visit. 

## 2021-11-11 NOTE — Progress Notes (Signed)
GASTROENTEROLOGY PROCEDURE H&P NOTE   Primary Care Physician: Janie Morning, DO    Reason for Procedure:  History of adenomatous colon polyps  Plan:    Surveillance colonoscopy  Patient is appropriate for endoscopic procedure(s) in the ambulatory ([facility]) setting.  The nature of the procedure, as well as the risks, benefits, and alternatives were carefully and thoroughly reviewed with the patient. Ample time for discussion and questions allowed. The patient understood, was satisfied, and agreed to proceed.     HPI: April Robinson is a 61 y.o. female who presents for surveillance colonoscopy.  Medical history as below.  Last colonoscopy December 2017.  Subcentimeter adenoma removed.  Tolerated the prep.  No recent shortness of breath.  No abdominal pain.  On Friday of last week she noticed chest pain which was worse with movement.  She states that she has been with her grandson who is heavy and she wondered if it was muscular.  She was seen in urgent care and reportedly abnormal EKG.  Tylenol was recommended for muscle strain.  She reports that while she does not exercise often she does enjoy dancing.  When she is dancing she notes that she works up a good sweat and does not have chest pain during this activity.  Her chest is not hurting today.  Past Medical History:  Diagnosis Date   Alcoholism ([place])    Arthritis    Blood transfusion without reported diagnosis    "40 years ago"   Cancer [facility])    breast L   Cataract    bilateral "starting"   Esophageal reflux    Hiatal hernia    Hx of radiation therapy 08/27/11 to 10/15/11   L breast   Hyperlipemia    Hypertension    past not on any medications currently   Personal history of colonic polyps    TUBULAR ADENOMA   Personal history of radiation therapy     Past Surgical History:  Procedure Laterality Date   ABDOMINAL HYSTERECTOMY     partial for DUB   BREAST LUMPECTOMY  07/23/2011   left breast   COLONOSCOPY      LAPAROSCOPIC HYSTERECTOMY  1995   POLYPECTOMY      Prior to Admission medications   Medication Sig Start Date End Date Taking? Authorizing Provider  amLODipine (NORVASC) 2.5 MG tablet TAKE 1 TABLET BY MOUTH EVERY DAY 05/31/20  Yes Jacelyn Pi, Irma M, MD  ondansetron (ZOFRAN) 4 MG tablet Take 1 tablet 30-60 minutes prior to each prep dose. 10/28/21  Yes Elanna Bert, Lajuan Lines, MD  pantoprazole (PROTONIX) 40 MG tablet Take 1 tablet (40 mg total) by mouth daily. 03/14/21  Yes Zehr, Laban Emperor, PA-C  albuterol (PROVENTIL HFA;VENTOLIN HFA) 108 (90 Base) MCG/ACT inhaler Inhale 2 puffs into the lungs every 6 (six) hours as needed for wheezing or shortness of breath. Patient not taking: Reported on 10/28/2021 10/12/17   Jacelyn Pi, Lilia Argue, MD  Dexlansoprazole (DEXILANT) 30 MG capsule Take 2 capsules (60 mg total) by mouth daily. Patient not taking: Reported on 03/14/2021 01/30/21 03/31/21  Wieters, Hallie C, PA-C  hyoscyamine (LEVSIN SL) 0.125 MG SL tablet Place 1 tablet (0.125 mg total) under the tongue every 6 (six) hours as needed. 03/14/21   Zehr, Laban Emperor, PA-C  Multiple Vitamin (MULTI-VITAMIN PO) Take by mouth daily. Patient not taking: Reported on 10/28/2021    [provider]  Omega-3 Fatty Acids (FISH OIL PO) Take by mouth daily. Patient not taking: Reported on 10/28/2021  [provider]  OVER THE COUNTER MEDICATION daily. Patient not taking: Reported on 10/28/2021    [provider]  valACYclovir (VALTREX) 500 MG tablet TAKE 1 TABLET BY MOUTH TWICE DAILY FOR 3-5 DAYS WITH OUTBREAK. Patient not taking: Reported on 10/28/2021 06/07/20   Jacelyn Pi, Lilia Argue, MD    Current Outpatient Medications  Medication Sig Dispense Refill   amLODipine (NORVASC) 2.5 MG tablet TAKE 1 TABLET BY MOUTH EVERY DAY 90 tablet 1   ondansetron (ZOFRAN) 4 MG tablet Take 1 tablet 30-60 minutes prior to each prep dose. 2 tablet 0   pantoprazole (PROTONIX) 40 MG tablet Take 1 tablet (40 mg total) by mouth  daily. 90 tablet 4   albuterol (PROVENTIL HFA;VENTOLIN HFA) 108 (90 Base) MCG/ACT inhaler Inhale 2 puffs into the lungs every 6 (six) hours as needed for wheezing or shortness of breath. (Patient not taking: Reported on 10/28/2021) 1 Inhaler 0   Dexlansoprazole (DEXILANT) 30 MG capsule Take 2 capsules (60 mg total) by mouth daily. (Patient not taking: Reported on 03/14/2021) 120 capsule 0   hyoscyamine (LEVSIN SL) 0.125 MG SL tablet Place 1 tablet (0.125 mg total) under the tongue every 6 (six) hours as needed. 30 tablet 2   Multiple Vitamin (MULTI-VITAMIN PO) Take by mouth daily. (Patient not taking: Reported on 10/28/2021)     Omega-3 Fatty Acids (FISH OIL PO) Take by mouth daily. (Patient not taking: Reported on 10/28/2021)     OVER THE COUNTER MEDICATION daily. (Patient not taking: Reported on 10/28/2021)     valACYclovir (VALTREX) 500 MG tablet TAKE 1 TABLET BY MOUTH TWICE DAILY FOR 3-5 DAYS WITH OUTBREAK. (Patient not taking: Reported on 10/28/2021) 10 tablet 2   Current Facility-Administered Medications  Medication Dose Route Frequency Provider Last Rate Last Admin   0.9 %  sodium chloride infusion  500 mL Intravenous Once Asif Muchow, Lajuan Lines, MD        Allergies as of 11/11/2021 - Review Complete 11/11/2021  Allergen Reaction Noted   Meloxicam Shortness Of Breath and Nausea Only 03/25/2011   Miconazole nitrate Other (See Comments) 01/06/2012   Latex Itching, Rash, and Other (See Comments) 09/07/2016    Family History  Problem Relation Age of Onset   Breast cancer Mother    Cancer Mother        breast   Hyperlipidemia Mother    Gastric cancer Mother        Gallbladder cancer   Diabetes Father    Hypertension Father    Cancer Father    Hyperlipidemia Father    Colon cancer Father    Gastric cancer Father    Cancer Brother        NEUROENDOCRINE   Hypertension Brother    Arthritis Brother        hips    Social History   Socioeconomic History   Marital status: Married    Spouse name:  Nathaneil Canary   Number of children: 3   Years of education: 12+   Highest education level: Not on file  Occupational History   Occupation: Research scientist (physical sciences): [employer]    Employer: [employer]  Tobacco Use   Smoking status: Former    Types: Cigarettes    Quit date: 1983    Years since quitting: 40.0   Smokeless tobacco: Never  Vaping Use   Vaping Use: Never used  Substance and Sexual Activity   Alcohol use: Yes    Alcohol/week: 0.0 standard drinks  Comment: rare   Drug use: No   Sexual activity: Yes    Partners: Male    Birth control/protection: Surgical    Comment: [name]  , SEXUAL PARTNERS MORE THAN 5  Other Topics Concern   Not on file  Social History Narrative   Lives with her husband. Their children (her 3 and his one) live locally.   Social Determinants of Health   Financial Resource Strain: Not on file  Food Insecurity: Not on file  Transportation Needs: Not on file  Physical Activity: Not on file  Stress: Not on file  Social Connections: Not on file  Intimate Partner Violence: Not on file    Physical Exam: Vital signs in last 24 hours: @BP  139/86    Pulse 78    Temp (!) 97.5 F (36.4 C) (Temporal)    Ht 5\' 2"  (1.575 m)    Wt 189 lb 6.4 oz (85.9 kg)    SpO2 100%    BMI 34.64 kg/m  GEN: NAD EYE: Sclerae anicteric ENT: MMM CV: Non-tachycardic Pulm: CTA b/l GI: Soft, NT/ND NEURO:  Alert & Oriented x 3   Zenovia Jarred, MD [facility]  11/11/2021 11:21 AM

## 2021-11-13 ENCOUNTER — Telehealth: Payer: Self-pay

## 2021-11-13 NOTE — Telephone Encounter (Signed)
°  Follow up Call-  Call back number 11/11/2021  Post procedure Call Back phone  # [phone]  Permission to leave phone message Yes  Some recent data might be hidden     Patient questions:  Do you have a fever, pain , or abdominal swelling? No. Pain Score  0 *  Have you tolerated food without any problems? Yes.    Have you been able to return to your normal activities? Yes.    Do you have any questions about your discharge instructions: Diet   No. Medications  No. Follow up visit  No.  Do you have questions or concerns about your Care? No.  Actions: * If pain score is 4 or above: No action needed, pain <4.

## 2021-11-17 ENCOUNTER — Encounter: Payer: Self-pay | Admitting: Internal Medicine

## 2022-02-03 ENCOUNTER — Encounter (HOSPITAL_COMMUNITY): Payer: Self-pay | Admitting: Emergency Medicine

## 2022-02-03 ENCOUNTER — Emergency Department (HOSPITAL_COMMUNITY)
Admission: EM | Admit: 2022-02-03 | Discharge: 2022-02-04 | Disposition: A | Discharge status: Home / Self Care | Payer: Managed Care, Other (non HMO) | Attending: Emergency Medicine | Admitting: Emergency Medicine

## 2022-02-03 ENCOUNTER — Other Ambulatory Visit: Payer: Self-pay

## 2022-02-03 DIAGNOSIS — R112 Nausea with vomiting, unspecified: Secondary | ICD-10-CM | POA: Diagnosis present

## 2022-02-03 DIAGNOSIS — Z91040 Latex allergy status: Secondary | ICD-10-CM | POA: Insufficient documentation

## 2022-02-03 DIAGNOSIS — I10 Essential (primary) hypertension: Secondary | ICD-10-CM | POA: Insufficient documentation

## 2022-02-03 DIAGNOSIS — R197 Diarrhea, unspecified: Secondary | ICD-10-CM | POA: Insufficient documentation

## 2022-02-03 DIAGNOSIS — Z79899 Other long term (current) drug therapy: Secondary | ICD-10-CM | POA: Insufficient documentation

## 2022-02-03 NOTE — ED Triage Notes (Signed)
Patient from home, had food at a [place] earlier today.  Has been having nausea, vomiting and diarrhea since.  Patient states she has been having abdominal cramping, was given '4mg'$  of Zofran by EMS.   ?

## 2022-02-04 LAB — URINALYSIS, ROUTINE W REFLEX MICROSCOPIC
Bilirubin Urine: NEGATIVE
Glucose, UA: NEGATIVE mg/dL
Ketones, ur: NEGATIVE mg/dL
Leukocytes,Ua: NEGATIVE
Nitrite: NEGATIVE
Protein, ur: NEGATIVE mg/dL
Specific Gravity, Urine: 1.018 (ref 1.005–1.030)
pH: 5 (ref 5.0–8.0)

## 2022-02-04 LAB — COMPREHENSIVE METABOLIC PANEL
ALT: 29 U/L (ref 0–44)
AST: 36 U/L (ref 15–41)
Albumin: 3.9 g/dL (ref 3.5–5.0)
Alkaline Phosphatase: 58 U/L (ref 38–126)
Anion gap: 6 (ref 5–15)
BUN: 17 mg/dL (ref 8–23)
CO2: 26 mmol/L (ref 22–32)
Calcium: 8.9 mg/dL (ref 8.9–10.3)
Chloride: 109 mmol/L (ref 98–111)
Creatinine, Ser: 1.11 mg/dL — ABNORMAL HIGH (ref 0.44–1.00)
GFR, Estimated: 56 mL/min — ABNORMAL LOW (ref >60)
Glucose, Bld: 129 mg/dL — ABNORMAL HIGH (ref 70–99)
Potassium: 4.3 mmol/L (ref 3.5–5.1)
Sodium: 141 mmol/L (ref 135–145)
Total Bilirubin: 1 mg/dL (ref 0.3–1.2)
Total Protein: 6.5 g/dL (ref 6.5–8.1)

## 2022-02-04 LAB — CBC WITH DIFFERENTIAL/PLATELET
Abs Immature Granulocytes: 0.04 10*3/uL (ref 0.00–0.07)
Basophils Absolute: 0 10*3/uL (ref 0.0–0.1)
Basophils Relative: 0 %
Eosinophils Absolute: 0 10*3/uL (ref 0.0–0.5)
Eosinophils Relative: 0 %
HCT: 44.2 % (ref 36.0–46.0)
Hemoglobin: 14.4 g/dL (ref 12.0–15.0)
Immature Granulocytes: 0 %
Lymphocytes Relative: 11 %
Lymphs Abs: 1 10*3/uL (ref 0.7–4.0)
MCH: 28.4 pg (ref 26.0–34.0)
MCHC: 32.6 g/dL (ref 30.0–36.0)
MCV: 87.2 fL (ref 80.0–100.0)
Monocytes Absolute: 0.6 10*3/uL (ref 0.1–1.0)
Monocytes Relative: 6 %
Neutro Abs: 8 10*3/uL — ABNORMAL HIGH (ref 1.7–7.7)
Neutrophils Relative %: 83 %
Platelets: 233 10*3/uL (ref 150–400)
RBC: 5.07 MIL/uL (ref 3.87–5.11)
RDW: 14.4 % (ref 11.5–15.5)
WBC: 9.7 10*3/uL (ref 4.0–10.5)
nRBC: 0 % (ref 0.0–0.2)

## 2022-02-04 LAB — LIPASE, BLOOD: Lipase: 33 U/L (ref 11–51)

## 2022-02-04 MED ORDER — ONDANSETRON HCL 4 MG PO TABS: 4.0000 mg | ORAL_TABLET | Freq: Four times a day (QID) | ORAL | 0 refills | Status: DC

## 2022-02-04 NOTE — Discharge Instructions (Signed)
Eat a very bland diet today to give your stomach time to get back to normal.  Slowly progress back to your normal diet.  If you start having high fever, severe abdominal pain, persistent nausea vomiting and diarrhea return to the emergency room.  All of your blood work today looks normal. ?

## 2022-02-04 NOTE — ED Provider Notes (Signed)
?[facility] ?Provider Note ? ? ?CSN: [id number] ?Arrival date & time: 02/03/22  2353 ? ?  ? ?History ? ?Chief Complaint  ?Patient presents with  ? Emesis  ? Nausea  ? ? ?April Robinson is a 61 y.o. female. ? ?Patient is a 61 year old female with a history of hyperlipidemia, GERD, hypertension who is presenting today for nausea vomiting and diarrhea.  Patient unfortunately has been waiting for almost 12 hours but reports that she was fine last night and she was out at a [place] for a work event.  She did eat multiple different kinds of meats had half a glass of alcohol but felt fine when she left.  Unfortunately after she got home around 9 PM she started having recurrent nausea vomiting and diarrhea.  She reports that she could not get out of the bathroom because she was going so often.  She was having diffuse abdominal cramping.  Her husband reports that she was feeling so bad he called the ambulance.  Upon arrival to the emergency room she was given Zofran and had blood work done.  As she has been waiting she reports her abdominal pain is almost completely resolved, her nausea seems better and she has had no further vomiting.  She has had a prior hysterectomy but no other abdominal surgeries.  She does report that she takes pantoprazole for reflux and acid but is not 1 to typically vomit. ? ?The history is provided by the patient and the spouse.  ?Emesis ? ?  ? ?Home Medications ?Prior to Admission medications   ?Medication Sig Start Date End Date Taking? Authorizing Provider  ?albuterol (PROVENTIL HFA;VENTOLIN HFA) 108 (90 Base) MCG/ACT inhaler Inhale 2 puffs into the lungs every 6 (six) hours as needed for wheezing or shortness of breath. ?Patient not taking: Reported on 10/28/2021 10/12/17   Jacelyn Pi, Lilia Argue, MD  ?amLODipine (NORVASC) 2.5 MG tablet TAKE 1 TABLET BY MOUTH EVERY DAY 05/31/20   Jacelyn Pi, Lilia Argue, MD  ?Dexlansoprazole [facility]) 30 MG  capsule Take 2 capsules (60 mg total) by mouth daily. ?Patient not taking: Reported on 03/14/2021 01/30/21 03/31/21  Wieters, Madelynn Done C, PA-C  ?hyoscyamine (LEVSIN SL) 0.125 MG SL tablet Place 1 tablet (0.125 mg total) under the tongue every 6 (six) hours as needed. 03/14/21   Zehr, Laban Emperor, PA-C  ?Multiple Vitamin (MULTI-VITAMIN PO) Take by mouth daily. ?Patient not taking: Reported on 10/28/2021    [provider]  ?Omega-3 Fatty Acids (FISH OIL PO) Take by mouth daily. ?Patient not taking: Reported on 10/28/2021    [provider]  ?ondansetron (ZOFRAN) 4 MG tablet Take 1 tablet 30-60 minutes prior to each prep dose. 10/28/21   Pyrtle, Lajuan Lines, MD  ?OVER THE COUNTER MEDICATION daily. ?Patient not taking: Reported on 10/28/2021    [provider]  ?pantoprazole (PROTONIX) 40 MG tablet Take 1 tablet (40 mg total) by mouth daily. 03/14/21   Zehr, Laban Emperor, PA-C  ?valACYclovir (VALTREX) 500 MG tablet TAKE 1 TABLET BY MOUTH TWICE DAILY FOR 3-5 DAYS WITH OUTBREAK. ?Patient not taking: Reported on 10/28/2021 06/07/20   Jacelyn Pi, Lilia Argue, MD  ?   ? ?Allergies    ?Meloxicam, Miconazole nitrate, and Latex   ? ?Review of Systems   ?Review of Systems  ?Gastrointestinal:  Positive for vomiting.  ? ?Physical Exam ?Updated Vital Signs ?BP 139/85   Pulse 74   Temp 97.8 ?F (36.6 ?C) (Oral)  Resp 12   SpO2 100%  ?Physical Exam ?Vitals and nursing note reviewed.  ?Constitutional:   ?   General: She is not in acute distress. ?   Appearance: She is well-developed.  ?HENT:  ?   Head: Normocephalic and atraumatic.  ?   Mouth/Throat:  ?   Mouth: Mucous membranes are dry.  ?Eyes:  ?   Pupils: Pupils are equal, round, and reactive to light.  ?Cardiovascular:  ?   Rate and Rhythm: Normal rate and regular rhythm.  ?   Heart sounds: Normal heart sounds. No murmur heard. ?  No friction rub.  ?Pulmonary:  ?   Effort: Pulmonary effort is normal.  ?   Breath sounds: Normal breath sounds. No wheezing or rales.  ?Abdominal:  ?    General: Bowel sounds are normal. There is no distension.  ?   Palpations: Abdomen is soft.  ?   Tenderness: There is no abdominal tenderness. There is no guarding or rebound.  ?Musculoskeletal:     ?   General: No tenderness. Normal range of motion.  ?   Right lower leg: No edema.  ?   Left lower leg: No edema.  ?   Comments: No edema  ?Skin: ?   General: Skin is warm and dry.  ?   Findings: No rash.  ?Neurological:  ?   Mental Status: She is alert and oriented to person, place, and time. Mental status is at baseline.  ?   Cranial Nerves: No cranial nerve deficit.  ?Psychiatric:     ?   Mood and Affect: Mood normal.     ?   Behavior: Behavior normal.  ? ? ?ED Results / Procedures / Treatments   ?Labs ?(all labs ordered are listed, but only abnormal results are displayed) ?Labs Reviewed  ?COMPREHENSIVE METABOLIC PANEL - Abnormal; Notable for the following components:  ?    Result Value  ? Glucose, Bld 129 (*)   ? Creatinine, Ser 1.11 (*)   ? GFR, Estimated 56 (*)   ? All other components within normal limits  ?CBC WITH DIFFERENTIAL/PLATELET - Abnormal; Notable for the following components:  ? Neutro Abs 8.0 (*)   ? All other components within normal limits  ?URINALYSIS, ROUTINE W REFLEX MICROSCOPIC - Abnormal; Notable for the following components:  ? Hgb urine dipstick SMALL (*)   ? Bacteria, UA RARE (*)   ? All other components within normal limits  ?LIPASE, BLOOD  ? ? ?EKG ?None ? ?Radiology ?No results found. ? ?Procedures ?Procedures  ? ? ?Medications Ordered in ED ?Medications - No data to display ? ?ED Course/ Medical Decision Making/ A&P ?  ?                        ?Medical Decision Making ?Amount and/or Complexity of Data Reviewed ?External Data Reviewed: notes. ?Labs: ordered. Decision-making details documented in ED Course. ? ?Risk ?Prescription drug management. ? ? ?Patient presenting today with nausea vomiting and diarrhea.  She has waited almost 12 hours and did receive Zofran upon arrival to the  emergency room.  She has had no further vomiting.  She has a benign abdomen at this time does not meet criteria for a CT scan as there is a low suspicion for hepatitis, cholecystitis, appendicitis, obstruction or diverticulitis.  Patient has no urinary complaints at this time.  May have been a reaction to food she had to eat last night.  Discussed with patient options  of oral rehydration versus IV fluids.  She would first like to try to drink and see how that goes.  She has not had anything to eat or drink since having the Zofran.  I independently interpreted patient's labs today and CMP, CBC, lipase and UA are all within normal limits.  Will reassess patient after p.o. challenge. ? ?12:39 PM ?Was able to drink Sprite eat graham crackers and applesauce here.  She has no further vomiting.  Feel that she is stable for discharge home.  We will send her home with a prescription for Zofran to use as needed.  Findings were discussed with the patient.  She was given return precautions.  No indication for admission at this time. ? ? ? ? ? ? ? ?Final Clinical Impression(s) / ED Diagnoses ?Final diagnoses:  ?Nausea vomiting and diarrhea  ? ? ?Rx / DC Orders ?ED Discharge Orders   ? ? None  ? ?  ? ? ?  ?Blanchie Dessert, MD ?02/04/22 1239 ? ?

## 2022-02-04 NOTE — ED Provider Triage Note (Signed)
Emergency Medicine Provider Triage Evaluation Note ? ?April Robinson , a 61 y.o. female  was evaluated in triage.  Pt complains of abdominal cramping, greater than 10 episodes of NBNB emesis, and greater than 10 episodes of watery diarrhea this evening approximately 2 hours after she ate at a [employer] in [place].  No other known sick contacts, no fevers or chills, feeling improved after Zofran with EMS. ? ?Review of Systems  ?Positive: Nausea, vomiting, abdominal cramping, diarrhea ?Negative: Fevers, chills ? ?Physical Exam  ?BP 136/81 (BP Location: Right Arm)   Pulse 91   Temp 98.8 ?F (37.1 ?C) (Oral)   Resp 17   SpO2 100%  ?Gen:   Awake, no distress   ?Resp:  Normal effort  ?MSK:   Moves extremities without difficulty  ?Other:  Abdomen soft, nondistended, nontender.  Increased bowel sounds. ? ?Medical Decision Making  ?Medically screening exam initiated at 12:26 AM.  Appropriate orders placed.  Jestine Bicknell Mckesson was informed that the remainder of the evaluation will be completed by another provider, this initial triage assessment does not replace that evaluation, and the importance of remaining in the ED until their evaluation is complete. ? ?Likely foodborne illness.  Will obtain electrolytes due to significant loss today. ? ?This chart was dictated using voice recognition software, Dragon. Despite the best efforts of this provider to proofread and correct errors, errors may still occur which can change documentation meaning. ? ?  ?Emeline Darling, PA-C ?02/04/22 0028 ? ?

## 2022-03-03 ENCOUNTER — Encounter: Payer: Managed Care, Other (non HMO) | Admitting: Obstetrics and Gynecology

## 2022-03-04 ENCOUNTER — Ambulatory Visit
Admission: EM | Admit: 2022-03-04 | Discharge: 2022-03-04 | Disposition: A | Discharge status: Home / Self Care | Payer: Managed Care, Other (non HMO) | Attending: Physician Assistant | Admitting: Physician Assistant

## 2022-03-04 ENCOUNTER — Encounter: Payer: Self-pay | Admitting: Emergency Medicine

## 2022-03-04 ENCOUNTER — Other Ambulatory Visit: Payer: Self-pay

## 2022-03-04 ENCOUNTER — Ambulatory Visit (INDEPENDENT_AMBULATORY_CARE_PROVIDER_SITE_OTHER): Payer: Managed Care, Other (non HMO)

## 2022-03-04 DIAGNOSIS — R059 Cough, unspecified: Secondary | ICD-10-CM

## 2022-03-04 DIAGNOSIS — R5381 Other malaise: Secondary | ICD-10-CM | POA: Diagnosis present

## 2022-03-04 DIAGNOSIS — R42 Dizziness and giddiness: Secondary | ICD-10-CM | POA: Diagnosis present

## 2022-03-04 DIAGNOSIS — R5383 Other fatigue: Secondary | ICD-10-CM | POA: Diagnosis present

## 2022-03-04 DIAGNOSIS — R051 Acute cough: Secondary | ICD-10-CM | POA: Diagnosis present

## 2022-03-04 DIAGNOSIS — R531 Weakness: Secondary | ICD-10-CM | POA: Insufficient documentation

## 2022-03-04 LAB — POCT URINALYSIS DIP (MANUAL ENTRY)
Bilirubin, UA: NEGATIVE
Blood, UA: NEGATIVE
Glucose, UA: NEGATIVE mg/dL
Ketones, POC UA: NEGATIVE mg/dL
Leukocytes, UA: NEGATIVE
Nitrite, UA: NEGATIVE
Protein Ur, POC: 30 mg/dL — AB
Spec Grav, UA: 1.025 (ref 1.010–1.025)
Urobilinogen, UA: 0.2 E.U./dL
pH, UA: 5.5 (ref 5.0–8.0)

## 2022-03-04 NOTE — ED Triage Notes (Signed)
Pt here for cough and nasal congestion x 4 days with scratchy throat and generalized weakness  ?

## 2022-03-04 NOTE — ED Provider Notes (Signed)
?[place] ? ? ? ?CSN: [id number] ?Arrival date & time: 03/04/22  0808 ? ? ?  ? ?History   ?Chief Complaint ?Chief Complaint  ?Patient presents with  ? Cough  ? ? ?HPI ?April Robinson is a 61 y.o. female.  ? ?Patient presents today with a 5 to 6-day history of URI symptoms including cough, scratchy throat, nasal congestion.  She reports symptoms are interfering with her ability perform daily activities and she has generalized weakness, lightheadedness, fatigue/malaise.  Denies any shortness of breath, chest pain, nausea, vomiting.  Does report that her husband has been sick and she was recently out at a public gathering prior to symptom onset.  She does not believe it is the flu or COVID.  She has had COVID in the past with last episode several years ago.  She is up-to-date on COVID vaccines including booster.  She did not take an at-home COVID test when symptoms began.  She has tried allergy medication as well as previous prescription of cough medicine with temporary relief of symptoms.  She denies history of allergies, asthma, COPD, smoking, diabetes. ? ? ?Past Medical History:  ?Diagnosis Date  ? Alcoholism ([place])   ? Arthritis   ? Blood transfusion without reported diagnosis   ? "40 years ago"  ? Cancer [facility])   ? breast L  ? Cataract   ? bilateral "starting"  ? Esophageal reflux   ? Hiatal hernia   ? Hx of radiation therapy 08/27/11 to 10/15/11  ? L breast  ? Hyperlipemia   ? Hypertension   ? past not on any medications currently  ? Personal history of colonic polyps   ? TUBULAR ADENOMA  ? Personal history of radiation therapy   ? ? ?Patient Active Problem List  ? Diagnosis Date Noted  ? Cataract 10/28/2021  ? Suspected COVID-19 virus infection 11/07/2019  ? Cough 12/08/2018  ? Tendonitis, Achilles, right 08/05/2017  ? Left knee pain 04/23/2015  ? Knee pain, left 04/11/2015  ? Popliteal pain 04/11/2015  ? Hypertension 10/17/2014  ? Acute pharyngitis 09/04/2014  ? Left shoulder pain 02/19/2014   ? Benign paroxysmal positional vertigo 09/29/2013  ? Hot flashes due to tamoxifen 08/16/2013  ? Hx of vaginitis 04/15/2012  ? De Quervain's disease (tenosynovitis) 04/05/2012  ? Malignant neoplasm of upper-outer quadrant of left breast in female, estrogen receptor positive ([place])   ? LUQ abdominal pain 12/16/2010  ? IBS 12/03/2010  ? ELEVATED BP READING WITHOUT DX HYPERTENSION 12/03/2010  ? NECK SPRAIN AND STRAIN 12/03/2010  ? ANXIETY STATE, UNSPECIFIED 11/07/2010  ? ADD 11/07/2010  ? GERD 11/07/2010  ? CHEST PAIN UNSPECIFIED 11/07/2010  ? ? ?Past Surgical History:  ?Procedure Laterality Date  ? ABDOMINAL HYSTERECTOMY    ? partial for DUB  ? BREAST LUMPECTOMY  07/23/2011  ? left breast  ? COLONOSCOPY    ? [place]  ? POLYPECTOMY    ? ? ?OB History   ? ? Gravida  ?3  ? Para  ?3  ? Term  ?   ? Preterm  ?   ? AB  ?   ? Living  ?3  ?  ? ? SAB  ?   ? IAB  ?   ? Ectopic  ?   ? Multiple  ?   ? Live Births  ?   ?   ?  ?  ? ? ? ?Home Medications   ? ?Prior to Admission medications   ?Medication Sig  Start Date End Date Taking? Authorizing Provider  ?albuterol (PROVENTIL HFA;VENTOLIN HFA) 108 (90 Base) MCG/ACT inhaler Inhale 2 puffs into the lungs every 6 (six) hours as needed for wheezing or shortness of breath. ?Patient not taking: Reported on 10/28/2021 10/12/17   Jacelyn Pi, Lilia Argue, MD  ?amLODipine (NORVASC) 2.5 MG tablet TAKE 1 TABLET BY MOUTH EVERY DAY 05/31/20   Jacelyn Pi, Lilia Argue, MD  ?Dexlansoprazole [facility]) 30 MG capsule Take 2 capsules (60 mg total) by mouth daily. ?Patient not taking: Reported on 03/14/2021 01/30/21 03/31/21  Wieters, Madelynn Done C, PA-C  ?hyoscyamine (LEVSIN SL) 0.125 MG SL tablet Place 1 tablet (0.125 mg total) under the tongue every 6 (six) hours as needed. 03/14/21   Zehr, Laban Emperor, PA-C  ?Multiple Vitamin (MULTI-VITAMIN PO) Take by mouth daily. ?Patient not taking: Reported on 10/28/2021    [provider]  ?Omega-3 Fatty Acids (FISH OIL PO) Take by mouth  daily. ?Patient not taking: Reported on 10/28/2021    [provider]  ?ondansetron (ZOFRAN) 4 MG tablet Take 1 tablet 30-60 minutes prior to each prep dose. 10/28/21   Pyrtle, Lajuan Lines, MD  ?ondansetron (ZOFRAN) 4 MG tablet Take 1 tablet (4 mg total) by mouth every 6 (six) hours. 02/04/22   Blanchie Dessert, MD  ?pantoprazole (PROTONIX) 40 MG tablet Take 1 tablet (40 mg total) by mouth daily. 03/14/21   Zehr, Laban Emperor, PA-C  ?valACYclovir (VALTREX) 500 MG tablet TAKE 1 TABLET BY MOUTH TWICE DAILY FOR 3-5 DAYS WITH OUTBREAK. ?Patient not taking: Reported on 10/28/2021 06/07/20   Jacelyn Pi, Lilia Argue, MD  ? ? ?Family History ?Family History  ?Problem Relation Age of Onset  ? Breast cancer Mother   ? Cancer Mother   ?     breast  ? Hyperlipidemia Mother   ? Gastric cancer Mother   ?     Gallbladder cancer  ? Diabetes Father   ? Hypertension Father   ? Cancer Father   ? Hyperlipidemia Father   ? Colon cancer Father   ? Gastric cancer Father   ? Cancer Brother   ?     NEUROENDOCRINE  ? Hypertension Brother   ? Arthritis Brother   ?     hips  ? ? ?Social History ?Social History  ? ?Tobacco Use  ? Smoking status: Former  ?  Types: Cigarettes  ?  Quit date: 60  ?  Years since quitting: 40.3  ? Smokeless tobacco: Never  ?Vaping Use  ? Vaping Use: Never used  ?Substance Use Topics  ? Alcohol use: Yes  ?  Alcohol/week: 0.0 standard drinks  ?  Comment: rare  ? Drug use: No  ? ? ? ?Allergies   ?Meloxicam, Miconazole nitrate, and Latex ? ? ?Review of Systems ?Review of Systems  ?Constitutional:  Positive for activity change, appetite change and fatigue. Negative for chills and fever.  ?HENT:  Positive for congestion and sore throat. Negative for sinus pressure and sneezing.   ?Respiratory:  Positive for cough. Negative for shortness of breath.   ?Cardiovascular:  Negative for chest pain.  ?Gastrointestinal:  Negative for abdominal pain, diarrhea, nausea and vomiting.  ?Neurological:  Positive for light-headedness. Negative for  dizziness and headaches.  ? ? ?Physical Exam ?Triage Vital Signs ?ED Triage Vitals  ?Enc Vitals Group  ?   BP 03/04/22 0829 140/74  ?   Pulse Rate 03/04/22 0829 95  ?   Resp 03/04/22 0829 18  ?   Temp 03/04/22 6073  98.2 ?F (36.8 ?C)  ?   Temp Source 03/04/22 0829 Oral  ?   SpO2 03/04/22 0829 98 %  ?   Weight --   ?   Height --   ?   Head Circumference --   ?   Peak Flow --   ?   Pain Score 03/04/22 0830 3  ?   Pain Loc --   ?   Pain Edu? --   ?   Excl. in [place]? --   ? ?Orthostatic VS for the past 24 hrs: ? BP- Lying Pulse- Lying BP- Sitting Pulse- Sitting BP- Standing at 0 minutes Pulse- Standing at 0 minutes  ?03/04/22 1033 (!) 80/56 82 101/71 83 115/74 92  ? ? ?Updated Vital Signs ?BP 101/70 (BP Location: Right Leg) Comment: resutls given to ER  Pulse 83   Temp 98.2 ?F (36.8 ?C) (Oral)   Resp 18   SpO2 98%  ? ?Visual Acuity ?Right Eye Distance:   ?Left Eye Distance:   ?Bilateral Distance:   ? ?Right Eye Near:   ?Left Eye Near:    ?Bilateral Near:    ? ?Physical Exam ?Vitals reviewed.  ?Constitutional:   ?   General: She is awake. She is not in acute distress. ?   Appearance: Normal appearance. She is well-developed. She is not ill-appearing.  ?   Comments: Very pleasant female appears stated age no acute distress  ?HENT:  ?   Head: Normocephalic and atraumatic.  ?   Right Ear: Tympanic membrane, ear canal and external ear normal. Tympanic membrane is not erythematous or bulging.  ?   Left Ear: Tympanic membrane, ear canal and external ear normal. Tympanic membrane is not erythematous or bulging.  ?   Nose:  ?   Right Sinus: No maxillary sinus tenderness or frontal sinus tenderness.  ?   Left Sinus: No maxillary sinus tenderness or frontal sinus tenderness.  ?   Mouth/Throat:  ?   Pharynx: Uvula midline. Posterior oropharyngeal erythema present. No oropharyngeal exudate.  ?   Comments: Erythema and drainage posterior oropharynx ?Cardiovascular:  ?   Rate and Rhythm: Normal rate and regular rhythm.  ?   Heart  sounds: Normal heart sounds, S1 normal and S2 normal. No murmur heard. ?Pulmonary:  ?   Effort: Pulmonary effort is normal.  ?   Breath sounds: Normal breath sounds. No wheezing, rhonchi or rales.  ?   Comments: Clear to

## 2022-03-04 NOTE — Discharge Instructions (Signed)
Your blood pressure was a little bit low immediately back so I do recommend that you hold your blood pressure medication today.  Make sure you are drinking lots of fluid.  Eat small frequent meals throughout the day.  Please follow-up with either our clinic or primary care within a few days if your symptoms have not significantly improved.  If anything worsens and you develop chest pain, passing out, shortness of breath, nausea/vomiting interfere with oral intake, weakness you need to go to the emergency room immediately as we discussed. ?

## 2022-03-05 LAB — COMPREHENSIVE METABOLIC PANEL
ALT: 22 IU/L (ref 0–32)
AST: 22 IU/L (ref 0–40)
Albumin/Globulin Ratio: 1.8 (ref 1.2–2.2)
Albumin: 4.3 g/dL (ref 3.8–4.8)
Alkaline Phosphatase: 67 IU/L (ref 44–121)
BUN/Creatinine Ratio: 10 — ABNORMAL LOW (ref 12–28)
BUN: 12 mg/dL (ref 8–27)
Bilirubin Total: 0.6 mg/dL (ref 0.0–1.2)
CO2: 23 mmol/L (ref 20–29)
Calcium: 9 mg/dL (ref 8.7–10.3)
Chloride: 104 mmol/L (ref 96–106)
Creatinine, Ser: 1.15 mg/dL — ABNORMAL HIGH (ref 0.57–1.00)
Globulin, Total: 2.4 g/dL (ref 1.5–4.5)
Glucose: 90 mg/dL (ref 70–99)
Potassium: 3.9 mmol/L (ref 3.5–5.2)
Sodium: 144 mmol/L (ref 134–144)
Total Protein: 6.7 g/dL (ref 6.0–8.5)
eGFR: 54 mL/min/{1.73_m2} — ABNORMAL LOW (ref >59)

## 2022-03-05 LAB — CBC WITH DIFFERENTIAL/PLATELET
Basophils Absolute: 0 10*3/uL (ref 0.0–0.2)
Basos: 1 %
EOS (ABSOLUTE): 0 10*3/uL (ref 0.0–0.4)
Eos: 1 %
Hematocrit: 47.3 % — ABNORMAL HIGH (ref 34.0–46.6)
Hemoglobin: 15.3 g/dL (ref 11.1–15.9)
Immature Grans (Abs): 0 10*3/uL (ref 0.0–0.1)
Immature Granulocytes: 0 %
Lymphocytes Absolute: 1.8 10*3/uL (ref 0.7–3.1)
Lymphs: 53 %
MCH: 28.2 pg (ref 26.6–33.0)
MCHC: 32.3 g/dL (ref 31.5–35.7)
MCV: 87 fL (ref 79–97)
Monocytes Absolute: 0.5 10*3/uL (ref 0.1–0.9)
Monocytes: 16 %
Neutrophils Absolute: 1 10*3/uL — ABNORMAL LOW (ref 1.4–7.0)
Neutrophils: 29 %
Platelets: 237 10*3/uL (ref 150–450)
RBC: 5.43 x10E6/uL — ABNORMAL HIGH (ref 3.77–5.28)
RDW: 14.3 % (ref 11.7–15.4)
WBC: 3.4 10*3/uL (ref 3.4–10.8)

## 2022-03-09 ENCOUNTER — Ambulatory Visit
Admission: EM | Admit: 2022-03-09 | Discharge: 2022-03-09 | Disposition: A | Discharge status: Home / Self Care | Payer: Managed Care, Other (non HMO) | Attending: Family Medicine | Admitting: Family Medicine

## 2022-03-09 DIAGNOSIS — J019 Acute sinusitis, unspecified: Secondary | ICD-10-CM | POA: Diagnosis not present

## 2022-03-09 MED ORDER — DOXYCYCLINE HYCLATE 100 MG PO CAPS: 100.0000 mg | ORAL_CAPSULE | Freq: Two times a day (BID) | ORAL | 0 refills | Status: AC

## 2022-03-09 MED ORDER — BENZONATATE 100 MG PO CAPS: 100.0000 mg | ORAL_CAPSULE | Freq: Three times a day (TID) | ORAL | 0 refills | Status: DC | PRN

## 2022-03-09 NOTE — ED Provider Notes (Addendum)
?[name] ? ? ? ?CSN: [id number] ?Arrival date & time: 03/09/22  1619 ? ? ?  ? ?History   ?Chief Complaint ?Chief Complaint  ?Patient presents with  ? URI  ? ? ?HPI ?April Robinson is a 61 y.o. female.  ? ? ?URI ?Here for 2 week h/o cough and congestion. At first she had those symptoms, plus some malaise and felt like it might have been COVID. She did not come get seen till 5/10. CXR was nl then. She does feel some better now, but her cough is still persistent and can come in spasms. Her appetite is better, and her bp today is improved to 126/84 (it was low on 5/10).  She does have some sinus pressure now and some burning in her sinuses. ? ?Past Medical History:  ?Diagnosis Date  ? Alcoholism ([place])   ? Arthritis   ? Blood transfusion without reported diagnosis   ? "40 years ago"  ? Cancer [facility])   ? breast L  ? Cataract   ? bilateral "starting"  ? Esophageal reflux   ? Hiatal hernia   ? Hx of radiation therapy 08/27/11 to 10/15/11  ? L breast  ? Hyperlipemia   ? Hypertension   ? past not on any medications currently  ? Personal history of colonic polyps   ? TUBULAR ADENOMA  ? Personal history of radiation therapy   ? ? ?Patient Active Problem List  ? Diagnosis Date Noted  ? Cataract 10/28/2021  ? Suspected COVID-19 virus infection 11/07/2019  ? Cough 12/08/2018  ? Tendonitis, Achilles, right 08/05/2017  ? Left knee pain 04/23/2015  ? Knee pain, left 04/11/2015  ? Popliteal pain 04/11/2015  ? Hypertension 10/17/2014  ? Acute pharyngitis 09/04/2014  ? Left shoulder pain 02/19/2014  ? Benign paroxysmal positional vertigo 09/29/2013  ? Hot flashes due to tamoxifen 08/16/2013  ? Hx of vaginitis 04/15/2012  ? De Quervain's disease (tenosynovitis) 04/05/2012  ? Malignant neoplasm of upper-outer quadrant of left breast in female, estrogen receptor positive ([place])   ? LUQ abdominal pain 12/16/2010  ? IBS 12/03/2010  ? ELEVATED BP READING WITHOUT DX HYPERTENSION 12/03/2010  ? NECK SPRAIN AND STRAIN  12/03/2010  ? ANXIETY STATE, UNSPECIFIED 11/07/2010  ? ADD 11/07/2010  ? GERD 11/07/2010  ? CHEST PAIN UNSPECIFIED 11/07/2010  ? ? ?Past Surgical History:  ?Procedure Laterality Date  ? ABDOMINAL HYSTERECTOMY    ? partial for DUB  ? BREAST LUMPECTOMY  07/23/2011  ? left breast  ? COLONOSCOPY    ? [place]  ? POLYPECTOMY    ? ? ?OB History   ? ? Gravida  ?3  ? Para  ?3  ? Term  ?   ? Preterm  ?   ? AB  ?   ? Living  ?3  ?  ? ? SAB  ?   ? IAB  ?   ? Ectopic  ?   ? Multiple  ?   ? Live Births  ?   ?   ?  ?  ? ? ? ?Home Medications   ? ?Prior to Admission medications   ?Medication Sig Start Date End Date Taking? Authorizing Provider  ?benzonatate (TESSALON) 100 MG capsule Take 1 capsule (100 mg total) by mouth 3 (three) times daily as needed for cough. 03/09/22  Yes Barrett Henle, MD  ?doxycycline (VIBRAMYCIN) 100 MG capsule Take 1 capsule (100 mg total) by mouth 2 (two) times daily for 7 days. 03/09/22  03/16/22 Yes Barrett Henle, MD  ?albuterol (PROVENTIL HFA;VENTOLIN HFA) 108 (90 Base) MCG/ACT inhaler Inhale 2 puffs into the lungs every 6 (six) hours as needed for wheezing or shortness of breath. ?Patient not taking: Reported on 10/28/2021 10/12/17   Jacelyn Pi, Lilia Argue, MD  ?amLODipine (NORVASC) 2.5 MG tablet TAKE 1 TABLET BY MOUTH EVERY DAY ?Patient not taking: Reported on 03/09/2022 05/31/20   Jacelyn Pi, Lilia Argue, MD  ?Dexlansoprazole [facility]) 30 MG capsule Take 2 capsules (60 mg total) by mouth daily. ?Patient not taking: Reported on 03/14/2021 01/30/21 03/31/21  Wieters, Madelynn Done C, PA-C  ?hyoscyamine (LEVSIN SL) 0.125 MG SL tablet Place 1 tablet (0.125 mg total) under the tongue every 6 (six) hours as needed. 03/14/21   Zehr, Laban Emperor, PA-C  ?Multiple Vitamin (MULTI-VITAMIN PO) Take by mouth daily. ?Patient not taking: Reported on 10/28/2021    [provider]  ?Omega-3 Fatty Acids (FISH OIL PO) Take by mouth daily. ?Patient not taking: Reported on 10/28/2021    [provider]  ?ondansetron (ZOFRAN) 4 MG tablet Take 1 tablet 30-60 minutes prior to each prep dose. 10/28/21   Pyrtle, Lajuan Lines, MD  ?ondansetron (ZOFRAN) 4 MG tablet Take 1 tablet (4 mg total) by mouth every 6 (six) hours. 02/04/22   Blanchie Dessert, MD  ?pantoprazole (PROTONIX) 40 MG tablet Take 1 tablet (40 mg total) by mouth daily. 03/14/21   Zehr, Laban Emperor, PA-C  ?valACYclovir (VALTREX) 500 MG tablet TAKE 1 TABLET BY MOUTH TWICE DAILY FOR 3-5 DAYS WITH OUTBREAK. ?Patient not taking: Reported on 10/28/2021 06/07/20   Jacelyn Pi, Lilia Argue, MD  ? ? ?Family History ?Family History  ?Problem Relation Age of Onset  ? Breast cancer Mother   ? Cancer Mother   ?     breast  ? Hyperlipidemia Mother   ? Gastric cancer Mother   ?     Gallbladder cancer  ? Diabetes Father   ? Hypertension Father   ? Cancer Father   ? Hyperlipidemia Father   ? Colon cancer Father   ? Gastric cancer Father   ? Cancer Brother   ?     NEUROENDOCRINE  ? Hypertension Brother   ? Arthritis Brother   ?     hips  ? ? ?Social History ?Social History  ? ?Tobacco Use  ? Smoking status: Former  ?  Types: Cigarettes  ?  Quit date: 65  ?  Years since quitting: 40.3  ? Smokeless tobacco: Never  ?Vaping Use  ? Vaping Use: Never used  ?Substance Use Topics  ? Alcohol use: Yes  ?  Alcohol/week: 0.0 standard drinks  ?  Comment: rare  ? Drug use: No  ? ? ? ?Allergies   ?Meloxicam, Miconazole nitrate, and Latex ? ? ?Review of Systems ?Review of Systems ? ? ?Physical Exam ?Triage Vital Signs ?ED Triage Vitals  ?Enc Vitals Group  ?   BP 03/09/22 1703 126/84  ?   Pulse Rate 03/09/22 1703 83  ?   Resp 03/09/22 1703 18  ?   Temp 03/09/22 1703 98 ?F (36.7 ?C)  ?   Temp Source 03/09/22 1703 Oral  ?   SpO2 03/09/22 1703 95 %  ?   Weight --   ?   Height --   ?   Head Circumference --   ?   Peak Flow --   ?   Pain Score 03/09/22 1702 0  ?   Pain Loc --   ?  Pain Edu? --   ?   Excl. in [place]? --   ? ?No data found. ? ?Updated Vital Signs ?BP 126/84 (BP Location: Right Arm)   Pulse 83    Temp 98 ?F (36.7 ?C) (Oral)   Resp 18   SpO2 95%  ? ?Visual Acuity ?Right Eye Distance:   ?Left Eye Distance:   ?Bilateral Distance:   ? ?Right Eye Near:   ?Left Eye Near:    ?Bilateral Near:    ? ?Physical Exam ?Vitals reviewed.  ?Constitutional:   ?   General: She is not in acute distress. ?   Appearance: She is not toxic-appearing.  ?HENT:  ?   Right Ear: Tympanic membrane and ear canal normal.  ?   Left Ear: Tympanic membrane and ear canal normal.  ?   Nose: Nose normal.  ?   Mouth/Throat:  ?   Mouth: Mucous membranes are moist.  ?Eyes:  ?   Extraocular Movements: Extraocular movements intact.  ?   Conjunctiva/sclera: Conjunctivae normal.  ?   Pupils: Pupils are equal, round, and reactive to light.  ?Cardiovascular:  ?   Rate and Rhythm: Normal rate and regular rhythm.  ?   Heart sounds: No murmur heard. ?Pulmonary:  ?   Effort: Pulmonary effort is normal. No respiratory distress.  ?   Breath sounds: No wheezing, rhonchi or rales.  ?Chest:  ?   Chest wall: No tenderness.  ?Musculoskeletal:  ?   Cervical back: Neck supple.  ?Lymphadenopathy:  ?   Cervical: No cervical adenopathy.  ?Skin: ?   Capillary Refill: Capillary refill takes less than 2 seconds.  ?   Coloration: Skin is not jaundiced or pale.  ?Neurological:  ?   General: No focal deficit present.  ?   Mental Status: She is alert and oriented to person, place, and time.  ?Psychiatric:     ?   Behavior: Behavior normal.  ? ? ? ?UC Treatments / Results  ?Labs ?(all labs ordered are listed, but only abnormal results are displayed) ?Labs Reviewed - No data to display ? ?EKG ? ? ?Radiology ?No results found. ? ?Procedures ?Procedures (including critical care time) ? ?Medications Ordered in UC ?Medications - No data to display ? ?Initial Impression / Assessment and Plan / UC Course  ?I have reviewed the triage vital signs and the nursing notes. ? ?Pertinent labs & imaging results that were available during my care of the patient were reviewed by me and  considered in my medical decision making (see chart for details). ? ?  ? ?I am going to treat for poss sinusitis and send in some tessalon. Discussed that she should be checking her bp at home, and start bp meds one at

## 2022-03-09 NOTE — Discharge Instructions (Addendum)
Benzonatate 100 mg--1 capsule 3 times daily as needed for cough  ? ?Doxycycline 100 mg --1 capsule 2 times daily for 7 days ?

## 2022-03-09 NOTE — ED Triage Notes (Signed)
Patient presents to Urgent Care with complaints of cough, nasal congestion, and scratchy throat since 14 days ago. Patient reports just not getting any better.   ? ?

## 2022-03-31 ENCOUNTER — Ambulatory Visit: Payer: Managed Care, Other (non HMO) | Admitting: Obstetrics and Gynecology

## 2022-03-31 ENCOUNTER — Encounter: Payer: Self-pay | Admitting: Obstetrics and Gynecology

## 2022-03-31 VITALS — BP 122/84 | HR 79 | Ht 63.0 in | Wt 186.0 lb

## 2022-03-31 DIAGNOSIS — R1031 Right lower quadrant pain: Secondary | ICD-10-CM | POA: Diagnosis not present

## 2022-03-31 DIAGNOSIS — G8929 Other chronic pain: Secondary | ICD-10-CM

## 2022-03-31 NOTE — Progress Notes (Signed)
GYNECOLOGY  VISIT   HPI: 61 y.o.   Married  [place] American  female   G3P3 with No LMP recorded. Patient has had a hysterectomy.   here for pelvic pain, more RLQ x3 months.    Feels like a pulled muscle, onset after lifting her mother.  Location is steady.  Pain is worse at times, maybe getting a little better.   Notices the pain if she turns over or walks.   No diarrhea or constipation.  Has daily BMs.   Colonoscopy in January, polyp removed.   No dysuria or hematuria.   No renal stones.   Eating well.   Not taking pain medication.   Some fever recently.  No known Covid.   No change in partner.   Cares for her mother who has Alzheimer's. Does lifting and transferring her.   Works in Engineer, technical sales.  Retiring.   GYNECOLOGIC HISTORY: No LMP recorded. Patient has had a hysterectomy. Contraception:  hyst Menopausal hormone therapy:  none Last mammogram:  05-12-21 Neg/Birads1. Hx Lt.Br.cancer 2012 Last pap smear:   2012 Neg        OB History     Gravida  3   Para  3   Term      Preterm      AB      Living  3      SAB      IAB      Ectopic      Multiple      Live Births                 Patient Active Problem List   Diagnosis Date Noted   Cataract 10/28/2021   Suspected COVID-19 virus infection 11/07/2019   Cough 12/08/2018   Tendonitis, Achilles, right 08/05/2017   Left knee pain 04/23/2015   Knee pain, left 04/11/2015   Popliteal pain 04/11/2015   Hypertension 10/17/2014   Acute pharyngitis 09/04/2014   Left shoulder pain 02/19/2014   Benign paroxysmal positional vertigo 09/29/2013   Hot flashes due to tamoxifen 08/16/2013   Hx of vaginitis 04/15/2012   De Quervain's disease (tenosynovitis) 04/05/2012   Malignant neoplasm of upper-outer quadrant of left breast in female, estrogen receptor positive ([place])    LUQ abdominal pain 12/16/2010   IBS 12/03/2010   ELEVATED BP READING WITHOUT DX HYPERTENSION 12/03/2010   NECK SPRAIN AND STRAIN  12/03/2010   ANXIETY STATE, UNSPECIFIED 11/07/2010   ADD 11/07/2010   GERD 11/07/2010   CHEST PAIN UNSPECIFIED 11/07/2010    Past Medical History:  Diagnosis Date   Alcoholism ([place])    Arthritis    Blood transfusion without reported diagnosis    "40 years ago"   Cancer [facility])    breast L   Cataract    bilateral "starting"   Esophageal reflux    Hiatal hernia    Hx of radiation therapy 08/27/11 to 10/15/11   L breast   Hyperlipemia    Hypertension    past not on any medications currently   Personal history of colonic polyps    TUBULAR ADENOMA   Personal history of radiation therapy     Past Surgical History:  Procedure Laterality Date   ABDOMINAL HYSTERECTOMY     partial for DUB   BREAST LUMPECTOMY  07/23/2011   left breast   COLONOSCOPY     LAPAROSCOPIC HYSTERECTOMY  1995   POLYPECTOMY      Current Outpatient Medications  Medication Sig Dispense Refill   albuterol (PROVENTIL  HFA;VENTOLIN HFA) 108 (90 Base) MCG/ACT inhaler Inhale 2 puffs into the lungs every 6 (six) hours as needed for wheezing or shortness of breath. (Patient not taking: Reported on 10/28/2021) 1 Inhaler 0   amLODipine (NORVASC) 2.5 MG tablet TAKE 1 TABLET BY MOUTH EVERY DAY (Patient not taking: Reported on 03/09/2022) 90 tablet 1   benzonatate (TESSALON) 100 MG capsule Take 1 capsule (100 mg total) by mouth 3 (three) times daily as needed for cough. 21 capsule 0   Multiple Vitamin (MULTI-VITAMIN PO) Take by mouth daily. (Patient not taking: Reported on 10/28/2021)     ondansetron (ZOFRAN) 4 MG tablet Take 1 tablet 30-60 minutes prior to each prep dose. 2 tablet 0   ondansetron (ZOFRAN) 4 MG tablet Take 1 tablet (4 mg total) by mouth every 6 (six) hours. 12 tablet 0   pantoprazole (PROTONIX) 40 MG tablet Take 1 tablet (40 mg total) by mouth daily. 90 tablet 4   valACYclovir (VALTREX) 500 MG tablet TAKE 1 TABLET BY MOUTH TWICE DAILY FOR 3-5 DAYS WITH OUTBREAK. (Patient not taking: Reported on 10/28/2021) 10  tablet 2   No current facility-administered medications for this visit.     ALLERGIES: Meloxicam, Miconazole nitrate, and Latex  Family History  Problem Relation Age of Onset   Breast cancer Mother    Cancer Mother        breast   Hyperlipidemia Mother    Gastric cancer Mother        Gallbladder cancer   Alzheimer's disease Mother    Diabetes Father    Hypertension Father    Cancer Father    Hyperlipidemia Father    Colon cancer Father    Gastric cancer Father    Cancer Brother        NEUROENDOCRINE   Hypertension Brother    Arthritis Brother        hips    Social History   Socioeconomic History   Marital status: Married    Spouse name: Nathaneil Canary   Number of children: 3   Years of education: 12+   Highest education level: Not on file  Occupational History   Occupation: Research scientist (physical sciences): [employer]    Employer: [employer]  Tobacco Use   Smoking status: Former    Types: Cigarettes    Quit date: 1983    Years since quitting: 40.4   Smokeless tobacco: Never  Vaping Use   Vaping Use: Never used  Substance and Sexual Activity   Alcohol use: Not Currently    Comment: rare   Drug use: No   Sexual activity: Yes    Partners: Male    Birth control/protection: Surgical    Comment: INERCOURSE AGEUNKOWN  , SEXUAL PARTNERS MORE THAN 5  Other Topics Concern   Not on file  Social History Narrative   Lives with her husband. Their children (her 3 and his one) live locally.   Social Determinants of Health   Financial Resource Strain: Not on file  Food Insecurity: Not on file  Transportation Needs: Not on file  Physical Activity: Not on file  Stress: Not on file  Social Connections: Not on file  Intimate Partner Violence: Not on file    Review of Systems  Genitourinary:  Positive for pelvic pain (RLQ x 3 months).  All other systems reviewed and are negative.  PHYSICAL EXAMINATION:    BP 122/84   Pulse 79   Ht '5\' 3"'$  (1.6 m)   Wt 186 lb  (  84.4 kg)   SpO2 98%   BMI 32.95 kg/m     General appearance: alert, cooperative and appears stated age Head: Normocephalic, without obvious abnormality, atraumatic Lungs: clear to auscultation bilaterally Heart: regular rate and rhythm Abdomen: soft, non-tender, no masses,  no organomegaly No abnormal inguinal nodes palpated  Pelvic: External genitalia:  no lesions              Urethra:  normal appearing urethra with no masses, tenderness or lesions              Bartholins and Skenes: normal                 Vagina: normal appearing vagina with normal color and discharge, no lesions              Cervix:absent                Bimanual Exam:  Uterus:  absent              Adnexa: no mass, fullness, tenderness.  Teathering in the right adnexa.              Rectal exam: yes.  Confirms.              Anus:  normal sphincter tone, no lesions  Chaperone was present for exam:  Estill Bamberg, CMA  ASSESSMENT  Chronic RLQ pain.  No acute abdomen. Status post hysterectomy.  Caregiver status.  Hx left breast cancer.   PLAN  We discussed possible ovarian cyst or musculoskeletal strain as the cause for the pain.  She will return for pelvic US and follow up appointment. We discussed resources for caregiving.    An After Visit Summary was printed and given to the patient.  36 min  total time was spent for this patient encounter, including preparation, face-to-face counseling with the patient, coordination of care, and documentation of the encounter.

## 2022-04-10 ENCOUNTER — Other Ambulatory Visit: Payer: Self-pay | Admitting: Obstetrics and Gynecology

## 2022-04-10 DIAGNOSIS — Z1231 Encounter for screening mammogram for malignant neoplasm of breast: Secondary | ICD-10-CM

## 2022-04-20 ENCOUNTER — Other Ambulatory Visit: Payer: Self-pay | Admitting: Gastroenterology

## 2022-05-19 ENCOUNTER — Ambulatory Visit: Payer: Managed Care, Other (non HMO)

## 2022-05-20 ENCOUNTER — Ambulatory Visit
Admission: RE | Admit: 2022-05-20 | Discharge: 2022-05-20 | Disposition: A | Discharge status: Home / Self Care | Payer: Managed Care, Other (non HMO) | Source: Ambulatory Visit | Attending: Obstetrics and Gynecology | Admitting: Obstetrics and Gynecology

## 2022-05-20 DIAGNOSIS — Z1231 Encounter for screening mammogram for malignant neoplasm of breast: Secondary | ICD-10-CM

## 2022-05-21 ENCOUNTER — Ambulatory Visit (INDEPENDENT_AMBULATORY_CARE_PROVIDER_SITE_OTHER): Payer: Managed Care, Other (non HMO)

## 2022-05-21 ENCOUNTER — Encounter: Payer: Self-pay | Admitting: Obstetrics and Gynecology

## 2022-05-21 ENCOUNTER — Ambulatory Visit (INDEPENDENT_AMBULATORY_CARE_PROVIDER_SITE_OTHER): Payer: Managed Care, Other (non HMO) | Admitting: Obstetrics and Gynecology

## 2022-05-21 VITALS — BP 152/82 | Ht 63.0 in | Wt 186.0 lb

## 2022-05-21 DIAGNOSIS — G8929 Other chronic pain: Secondary | ICD-10-CM

## 2022-05-21 DIAGNOSIS — R1031 Right lower quadrant pain: Secondary | ICD-10-CM

## 2022-05-21 NOTE — Progress Notes (Signed)
GYNECOLOGY  VISIT   HPI: 61 y.o.   Married  [place] American  female   G3P3 with No LMP recorded. Patient has had a hysterectomy.   here for pelvic ultrasound for chronic RLQ pain.  Pain is somewhat improved.   She did feel discomfort on the right side with her US pelvis.  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   GYNECOLOGIC HISTORY: No LMP recorded. Patient has had a hysterectomy. Contraception:  Hyst Menopausal hormone therapy:  none Last mammogram:  05-21-22 Neg/Birads1 Last pap smear:   2012 Neg        OB History     Gravida  3   Para  3   Term      Preterm      AB      Living  3      SAB      IAB      Ectopic      Multiple      Live Births                 Patient Active Problem List   Diagnosis Date Noted   Cataract 10/28/2021   Suspected COVID-19 virus infection 11/07/2019   Cough 12/08/2018   Tendonitis, Achilles, right 08/05/2017   Left knee pain 04/23/2015   Knee pain, left 04/11/2015   Popliteal pain 04/11/2015   Hypertension 10/17/2014   Acute pharyngitis 09/04/2014   Left shoulder pain 02/19/2014   Benign paroxysmal positional vertigo 09/29/2013   Hot flashes due to tamoxifen 08/16/2013   Hx of vaginitis 04/15/2012   De Quervain's disease (tenosynovitis) 04/05/2012   Malignant neoplasm of upper-outer quadrant of left breast in female, estrogen receptor positive ([place])    LUQ abdominal pain 12/16/2010   IBS 12/03/2010   ELEVATED BP READING WITHOUT DX HYPERTENSION 12/03/2010   NECK SPRAIN AND STRAIN 12/03/2010   ANXIETY STATE, UNSPECIFIED 11/07/2010   ADD 11/07/2010   GERD 11/07/2010   CHEST PAIN UNSPECIFIED 11/07/2010    Past Medical History:  Diagnosis Date   Alcoholism ([place])    Arthritis    Blood transfusion without reported diagnosis    "40 years ago"   Cancer [facility])    breast L   Cataract    bilateral "starting"   Esophageal reflux    Hiatal hernia    Hx of radiation therapy 08/27/11 to 10/15/11   L breast    Hyperlipemia    Hypertension    past not on any medications currently   Personal history of colonic polyps    TUBULAR ADENOMA   Personal history of radiation therapy     Past Surgical History:  Procedure Laterality Date   ABDOMINAL HYSTERECTOMY     partial for DUB   BREAST LUMPECTOMY  07/23/2011   left breast   COLONOSCOPY     LAPAROSCOPIC HYSTERECTOMY  1995   POLYPECTOMY      Current Outpatient Medications  Medication Sig Dispense Refill   albuterol (PROVENTIL HFA;VENTOLIN HFA) 108 (90 Base) MCG/ACT inhaler Inhale 2 puffs into the lungs every 6 (six) hours as needed for wheezing or shortness of breath. 1 Inhaler 0   Multiple Vitamin (MULTI-VITAMIN PO) Take by mouth daily.     pantoprazole (PROTONIX) 40 MG tablet TAKE 1 TABLET BY MOUTH EVERY DAY 90 tablet 4   valACYclovir (VALTREX) 500 MG tablet TAKE 1 TABLET BY MOUTH TWICE DAILY FOR 3-5 DAYS WITH OUTBREAK. 10 tablet 2   No current facility-administered medications for this  visit.     ALLERGIES: Meloxicam, Miconazole nitrate, and Latex  Family History  Problem Relation Age of Onset   Breast cancer Mother    Cancer Mother        breast   Hyperlipidemia Mother    Gastric cancer Mother        Gallbladder cancer   Alzheimer's disease Mother    Diabetes Father    Hypertension Father    Cancer Father    Hyperlipidemia Father    Colon cancer Father    Gastric cancer Father    Cancer Brother        NEUROENDOCRINE   Hypertension Brother    Arthritis Brother        hips    Social History   Socioeconomic History   Marital status: Married    Spouse name: Nathaneil Canary   Number of children: 3   Years of education: 12+   Highest education level: Not on file  Occupational History   Occupation: Research scientist (physical sciences): [employer]    Employer: [employer]  Tobacco Use   Smoking status: Former    Types: Cigarettes    Quit date: 1983    Years since quitting: 40.5   Smokeless tobacco: Never  Vaping Use    Vaping Use: Never used  Substance and Sexual Activity   Alcohol use: Not Currently    Comment: rare   Drug use: No   Sexual activity: Yes    Partners: Male    Birth control/protection: Surgical    Comment: INERCOURSE AGEUNKOWN  , SEXUAL PARTNERS MORE THAN 5  Other Topics Concern   Not on file  Social History Narrative   Lives with her husband. Their children (her 3 and his one) live locally.   Social Determinants of Health   Financial Resource Strain: Not on file  Food Insecurity: Not on file  Transportation Needs: Not on file  Physical Activity: Not on file  Stress: Not on file  Social Connections: Not on file  Intimate Partner Violence: Not on file    Review of Systems  All other systems reviewed and are negative.   PHYSICAL EXAMINATION:    BP (!) 152/82   Ht '5\' 3"'$  (1.6 m)   Wt 186 lb (84.4 kg)   BMI 32.95 kg/m     General appearance: alert, cooperative and appears stated age  Pelvic US Uterus absent.  Right ovary 2.13 x 1.49 x 1.39 cm.  Left ovary 2.13 x 1.49 x 1.39 cm.  No adnexal masses.  No free fluid.    ASSESSMENT  RLQ pain.  Status post hysterectomy.  Normal ovaries.   PLAN  Pelvic US images and report reviewed with patient.  Reassurance given.  If RLQ pain persists, I recommended she see her PCP for next steps in care.  We discussed lift devices that may be used in the home for transfers of individuals if needed.  Fu prn.    An After Visit Summary was printed and given to the patient.  20 min  total time was spent for this patient encounter, including preparation, face-to-face counseling with the patient, coordination of care, and documentation of the encounter.

## 2022-07-20 ENCOUNTER — Ambulatory Visit: Payer: Managed Care, Other (non HMO) | Admitting: Family Medicine

## 2022-07-20 ENCOUNTER — Ambulatory Visit (INDEPENDENT_AMBULATORY_CARE_PROVIDER_SITE_OTHER): Payer: Managed Care, Other (non HMO)

## 2022-07-20 ENCOUNTER — Ambulatory Visit: Payer: Self-pay

## 2022-07-20 VITALS — BP 118/84 | HR 71 | Ht 63.0 in | Wt 186.0 lb

## 2022-07-20 DIAGNOSIS — M25562 Pain in left knee: Secondary | ICD-10-CM

## 2022-07-20 DIAGNOSIS — G8929 Other chronic pain: Secondary | ICD-10-CM

## 2022-07-20 NOTE — Patient Instructions (Addendum)
Thank you for coming in today.   Please get an Xray today before you leave   I've referred you to Physical Therapy.  Let us know if you don't hear from them in one week.   Please use Voltaren gel (Generic Diclofenac Gel) up to 4x daily for pain as needed.  This is available over-the-counter as both the name brand Voltaren gel and the generic diclofenac gel.   Check back in 6 weeks

## 2022-07-20 NOTE — Progress Notes (Signed)
   I, Peterson Lombard, LAT, ATC acting as a scribe for Lynne Leader, MD.  Subjective:    CC: L knee pain  HPI: Pt is a 61 y/o female c/o L knee pain x about 1 month. Pt does not recall any mechanism. 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  Pt locates pain to the anterior medial aspect of L knee.  L Knee swelling: no- was prior Mechanical symptoms: yes Radiates: no Aggravates: certain motions, prolonged sitting, increased activity Treatments tried: prednisone, knee compression sleeve  Dx testing: 05/06/18 L knee XR  04/11/15 L knee XR & L vasc US  Pertinent review of Systems: No fevers or chills  Relevant historical information: History of breast cancer.   Objective:    Vitals:   07/20/22 1251  BP: 118/84  Pulse: 71  SpO2: 99%   General: Well Developed, well nourished, and in no acute distress.   MSK: Left knee mild effusion otherwise normal-appearing normal motion with crepitation. Tender palpation medial joint line. Stable ligamentous exam. Positive Murray's test. Intact strength.  Lab and Radiology Results  Diagnostic Limited MSK Ultrasound of: Left knee Quad tendon intact normal. No significant joint effusion present on ultrasound. Patellar tendon intact. Medial joint line narrowed degenerative appearing medial meniscus partially extruded from the knee joint. Lateral joint line normal-appearing Posterior knee no Baker's cyst. 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  Osteophyte inferior patellar pole present.  No acute fractures. Await formal radiology review   Impression and Recommendations:    Assessment and Plan: 61 y.o. female with left knee pain thought to be due to exacerbation of DJD.  Discussed options.  She would like to pursue conservative management which makes sense.  Work on Forensic scientist.  Refer to physical  therapy for quad strengthening.  Recommend Voltaren gel.  Check back in 6 weeks.  Consider steroid injection if needed.  Weight loss would also be helpful.Marland Kitchen  PDMP not reviewed this encounter. Orders Placed This Encounter  Procedures   [place] LIMITED JOINT SPACE STRUCTURES LOW LEFT(NO LINKED CHARGES)    Order Specific Question:   Reason for Exam (SYMPTOM  OR DIAGNOSIS REQUIRED)    Answer:   left knee pain    Order Specific Question:   Preferred imaging location?    Answer:   [place]   DG Knee AP/LAT W/[place] Left    Standing Status:   Future    Number of Occurrences:   1    Standing Expiration Date:   08/19/2022    Order Specific Question:   Reason for Exam (SYMPTOM  OR DIAGNOSIS REQUIRED)    Answer:   left knee pain    Order Specific Question:   Preferred imaging location?    Answer:   [facility]   Ambulatory referral to Physical Therapy    Referral Priority:   Routine    Referral Type:   Physical Medicine    Referral Reason:   Specialty Services Required    Requested Specialty:   Physical Therapy    Number of Visits Requested:   1   No orders of the defined types were placed in this encounter.   Discussed warning signs or symptoms. Please see discharge instructions. Patient expresses understanding.   The above documentation has been reviewed and is accurate and complete Lynne Leader, M.D.

## 2022-07-22 NOTE — Progress Notes (Signed)
Left knee x-ray shows mild arthritis changes

## 2022-08-04 NOTE — Therapy (Signed)
OUTPATIENT PHYSICAL THERAPY LOWER EXTREMITY EVALUATION   Patient Name: April Robinson MRN: [id number] DOB:[birth year], 61 y.o., female Today's Date: 08/06/2022   PT End of Session - 08/06/22 0659     Visit Number 1    Authorization Type [employer]    Authorization Time Period 1 time visit             Past Medical History:  Diagnosis Date   Alcoholism ([place])    Arthritis    Blood transfusion without reported diagnosis    "40 years ago"   Cancer [facility])    breast L   Cataract    bilateral "starting"   Esophageal reflux    Hiatal hernia    Hx of radiation therapy 08/27/11 to 10/15/11   L breast   Hyperlipemia    Hypertension    past not on any medications currently   Personal history of colonic polyps    TUBULAR ADENOMA   Personal history of radiation therapy    Past Surgical History:  Procedure Laterality Date   ABDOMINAL HYSTERECTOMY     partial for DUB   BREAST LUMPECTOMY  07/23/2011   left breast   COLONOSCOPY     LAPAROSCOPIC HYSTERECTOMY  1995   POLYPECTOMY     Patient Active Problem List   Diagnosis Date Noted   Cataract 10/28/2021   Suspected COVID-19 virus infection 11/07/2019   Cough 12/08/2018   Tendonitis, Achilles, right 08/05/2017   Left knee pain 04/23/2015   Knee pain, left 04/11/2015   Popliteal pain 04/11/2015   Hypertension 10/17/2014   Acute pharyngitis 09/04/2014   Left shoulder pain 02/19/2014   Benign paroxysmal positional vertigo 09/29/2013   Hot flashes due to tamoxifen 08/16/2013   Hx of vaginitis 04/15/2012   De Quervain's disease (tenosynovitis) 04/05/2012   Malignant neoplasm of upper-outer quadrant of left breast in female, estrogen receptor positive ([place])    LUQ abdominal pain 12/16/2010   IBS 12/03/2010   ELEVATED BP READING WITHOUT DX HYPERTENSION 12/03/2010   NECK SPRAIN AND STRAIN 12/03/2010   ANXIETY STATE, UNSPECIFIED 11/07/2010   ADD 11/07/2010   GERD 11/07/2010   CHEST PAIN UNSPECIFIED 11/07/2010    PCP:  Janie Morning, DO  REFERRING PROVIDER: Lynne Leader, MD  REFERRING DIAG: Chronic pain of Lt knee   THERAPY DIAG:  Chronic pain of left knee  Rationale for Evaluation and Treatment Rehabilitation  ONSET DATE: 2 months ago  SUBJECTIVE:   SUBJECTIVE STATEMENT: Pt states that she noticed some swelling and pain in the knee about 2 months ago with no known cause. 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 She enjoys line dancing and would sometimes notice discomfort while doing this. Her pain and swelling has improved since 2 months ago, but she will still occasionally wake at night with pain. She has stiffness when sitting for a little while.  PERTINENT HISTORY: Caring for her mom of 2 years  PAIN:  Are you having pain? Yes: NPRS scale: 3/10 Pain location: medial knee  Pain description: sharp Aggravating factors: twisting knee, sleeping the wrong way, walking too much  Relieving factors: getting off the leg  PRECAUTIONS: None  WEIGHT BEARING RESTRICTIONS No  FALLS:  Has patient fallen in last 6 months? No  LIVING ENVIRONMENT: Lives with: lives with their family Lives in: House/apartment Stairs: No Has following equipment at home:   OCCUPATION: retired about 1 year ago, cares for her mother over weekends  PLOF:  Independent  PATIENT GOALS decrease pain with activity    OBJECTIVE:   DIAGNOSTIC FINDINGS: Xray Lt knee "Mild degenerative joint changes of left knee."  PATIENT SURVEYS:  1 time visit   COGNITION:  Overall cognitive status: Within functional limits for tasks assessed     SENSATION: Not tested-denies NT  EDEMA:  None noted   MUSCLE LENGTH: Hamstrings: Right WNL deg; Left WNL deg   POSTURE: No Significant postural limitations  PALPATION: Mild tenderness Lt knee medial joint line   LOWER EXTREMITY ROM: pain free; PROM WNL  Active ROM Right eval Left eval  Hip flexion    Hip extension     Hip abduction    Hip adduction    Hip internal rotation    Hip external rotation    Knee flexion WNL WNL  Knee extension WNL WNL  Ankle dorsiflexion    Ankle plantarflexion    Ankle inversion    Ankle eversion     (Blank rows = not tested)  LOWER EXTREMITY MMT:  MMT Right eval Left eval  Hip flexion 5 5  Hip extension 5 5  Hip abduction    Hip adduction    Hip internal rotation 5 4  Hip external rotation 5 4  Knee flexion 5 5  Knee extension 5 5  Ankle dorsiflexion    Ankle plantarflexion    Ankle inversion    Ankle eversion     (Blank rows = not tested)  LOWER EXTREMITY SPECIAL TESTS:  Hip special tests: Trendelenburg test: negative  FUNCTIONAL TESTS:  Squat, no limitations noted Pt demo lifting her mother from the chair- leading with Lt LE and pivoting on the Lt LE once coming to stand  GAIT: Distance walked: no limitations with ambulation Pt states she can walk up to 40 min at the mall, then her knee will begin to bother her     TODAY'S TREATMENT: Self-care: demo of proper technique for assisting transfers, switching sides to avoid repetitive twisting of the Lt knee HEP demo   PATIENT EDUCATION:  Education details: eval findings/POC; HEP; self-care Person educated: Patient Education method: Customer service manager Education comprehension: verbalized understanding and returned demonstration   HOME EXERCISE PROGRAM: Access Code: [email or web address] URL: [email or web address] Date: 08/06/2022 Prepared by: [facility]  Exercises - Standing Hip Internal Rotation with Resistance  - 1 x daily - 5 x weekly - 2 sets - 10 reps - Standing Hip Internal and External Rotation AAROM on Stool  - 1 x daily - 7 x weekly - 3 sets - 10 reps  ASSESSMENT:  CLINICAL IMPRESSION: Patient is a 61 y.o. F who was seen today for physical therapy evaluation and treatment for Lt knee pain that was noted 2 months ago.  Since this onset, her pain and swelling has improved. She has 5/5 LE strength except for Lt hip IR and ER. Pt has mild tenderness to the medial joint line, otherwise her pain was reproduced with twisting movements during today's session. She cares for her mother and is having to assist with transfers and basically all aspects of her ADLs. PT reviewed pt's impairments that are likely contributing to her Lt knee pain and the pt felt that she would be comfortable with this being a 1 time visit. PT provided education regarding activity modifications, especially when assisting with transfers. Also provided education on the importance of avoiding LE rotation at the knee and strengthening the hip for this. Pt verbalized and demonstrated  good understanding of this.    OBJECTIVE IMPAIRMENTS decreased strength.   ACTIVITY LIMITATIONS bed mobility and caring for others  PARTICIPATION LIMITATIONS: community activity  PERSONAL FACTORS Age, Fitness, and Past/current experiences are also affecting patient's functional outcome.   REHAB POTENTIAL: Excellent  CLINICAL DECISION MAKING: Stable/uncomplicated  EVALUATION COMPLEXITY: Low   GOALS: Goals reviewed with patient? Yes  SHORT TERM GOALS: Target date: 08/20/2022  Pt will demo understanding of proper technique while assisting with transfers to decrease strain on the medial knee.  Baseline: Goal status: MET     PLAN: PT FREQUENCY: one time visit  PT DURATION: other: 1 time visit  PLANNED INTERVENTIONS: Therapeutic exercises, Therapeutic activity, Patient/Family education, and Self Care  PLAN FOR NEXT SESSION: n/a  9:26 AM,08/06/22 Sherol Dade PT, DPT [place] [facility] [place]

## 2022-08-05 ENCOUNTER — Encounter: Payer: Self-pay | Admitting: Physical Therapy

## 2022-08-05 ENCOUNTER — Ambulatory Visit: Payer: Managed Care, Other (non HMO) | Attending: Family Medicine | Admitting: Physical Therapy

## 2022-08-05 DIAGNOSIS — M25562 Pain in left knee: Secondary | ICD-10-CM | POA: Insufficient documentation

## 2022-08-05 DIAGNOSIS — G8929 Other chronic pain: Secondary | ICD-10-CM | POA: Insufficient documentation

## 2022-08-06 ENCOUNTER — Other Ambulatory Visit: Payer: Self-pay

## 2022-08-31 ENCOUNTER — Ambulatory Visit: Payer: Managed Care, Other (non HMO) | Admitting: Family Medicine

## 2022-08-31 VITALS — BP 128/84 | HR 72 | Ht 63.0 in | Wt 186.0 lb

## 2022-08-31 DIAGNOSIS — G8929 Other chronic pain: Secondary | ICD-10-CM

## 2022-08-31 DIAGNOSIS — M25562 Pain in left knee: Secondary | ICD-10-CM | POA: Diagnosis not present

## 2022-08-31 NOTE — Patient Instructions (Addendum)
Thank you for coming in today.   Tylenol arthritis (generic version) 2 pills at bedtime.   Ok to use with Voltaren gel at bedtime.   We can do more including prescription celebrex. Let me know. This medicine like ibuprofen or aleve will likely increase blood pressure.   We can do a cortisone injection whenever you would like.   Recheck as needed.   Quad strength is helpful especially cycling.

## 2022-08-31 NOTE — Progress Notes (Signed)
   I, Peterson Lombard, LAT, ATC acting as a scribe for Lynne Leader, MD.  April Robinson is a 61 y.o. female who presents to [place] at [facility] today for f/u chronic left knee pain.  Patient was last seen by Dr. Georgina Snell on 07/20/2022 and was advised to use Voltaren gel and was referred to PT, completing 1 visit.  Today, patient reports L knee is feeling somewhat better. Pt notes L knee pain will sometimes wake her up at night. 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  Dx testing: 07/20/22 L knee XR 05/06/18 L knee XR             04/11/15 L knee XR & L vasc US  Pertinent review of systems: No fevers or chills  Relevant historical information: De Quervain's tenosynovitis. GERD  Exam:  BP 128/84   Pulse 72   Ht '5\' 3"'$  (1.6 m)   Wt 186 lb (84.4 kg)   SpO2 99%   BMI 32.95 kg/m  General: Well Developed, well nourished, and in no acute distress.   MSK: Left knee normal.  Normal motion.    Lab and Radiology Results  EXAM: LEFT KNEE 3 VIEWS   COMPARISON:  May 06, 2018   FINDINGS: No evidence of fracture, dislocation, or joint effusion. Mild tibial spine osteophytosis identified. Mild medial narrow femoral tibial joint space identified. Soft tissues are unremarkable.   IMPRESSION: Mild degenerative joint changes of left knee.     Electronically Signed   By: Abelardo Diesel M.D.   On: 07/21/2022 14:05 I, Lynne Leader, personally (independently) visualized and performed the interpretation of the images attached in this note.      Assessment and Plan: 61 y.o. female with left knee pain thought to be due to exacerbation of DJD.  She had 1 physical therapy session which seemed to help quite a bit.  Plan to work on Forensic scientist.  Also recommend Tylenol arthritis and Voltaren gel at bedtime which I think would be quite helpful.  Avoid oral NSAIDs due to GERD.  If needed Celebrex may be helpful.  We also talked about the potential for  steroid injection which she would like to avoid.  Check back as needed.  Certainly could do more physical therapy as well.   Total encounter time 20 minutes including face-to-face time with the patient and, reviewing past medical record, and charting on the date of service.      Discussed warning signs or symptoms. Please see discharge instructions. Patient expresses understanding.   The above documentation has been reviewed and is accurate and complete Lynne Leader, M.D.

## 2023-05-01 ENCOUNTER — Other Ambulatory Visit: Payer: Self-pay | Admitting: Gastroenterology

## 2023-05-21 ENCOUNTER — Other Ambulatory Visit: Payer: Self-pay | Admitting: Obstetrics and Gynecology

## 2023-05-21 DIAGNOSIS — Z1231 Encounter for screening mammogram for malignant neoplasm of breast: Secondary | ICD-10-CM

## 2023-05-25 ENCOUNTER — Ambulatory Visit
Admission: RE | Admit: 2023-05-25 | Discharge: 2023-05-25 | Disposition: A | Discharge status: Home / Self Care | Payer: Managed Care, Other (non HMO) | Source: Ambulatory Visit | Attending: Obstetrics and Gynecology | Admitting: Obstetrics and Gynecology

## 2023-05-25 DIAGNOSIS — Z1231 Encounter for screening mammogram for malignant neoplasm of breast: Secondary | ICD-10-CM

## 2023-06-02 ENCOUNTER — Ambulatory Visit
Admission: EM | Admit: 2023-06-02 | Discharge: 2023-06-02 | Disposition: A | Discharge status: Home / Self Care | Payer: Managed Care, Other (non HMO) | Attending: Physician Assistant | Admitting: Physician Assistant

## 2023-06-02 DIAGNOSIS — Z87891 Personal history of nicotine dependence: Secondary | ICD-10-CM | POA: Diagnosis not present

## 2023-06-02 DIAGNOSIS — Z20822 Contact with and (suspected) exposure to covid-19: Secondary | ICD-10-CM | POA: Diagnosis present

## 2023-06-02 NOTE — ED Provider Notes (Signed)
[facility]    CSN: [id number] Arrival date & time: 06/02/23  1600      History   Chief Complaint Chief Complaint  Patient presents with   COVID19 Exposure    Symptomatic    HPI April Robinson is a 62 y.o. female.   Patient here today for evaluation of nasal congestion, scratchy throat, occasional cough, fatigue and chills that started about 4 days ago.  She states that her husband came home yesterday after being diagnosed with COVID.  She has not had any vomiting.  She denies any diarrhea.  The history is provided by the patient.    Past Medical History:  Diagnosis Date   Alcoholism (HCC)    Arthritis    Blood transfusion without reported diagnosis    "40 years ago"   Cancer [facility])    breast L   Cataract    bilateral "starting"   Esophageal reflux    Hiatal hernia    Hx of radiation therapy 08/27/11 to 10/15/11   L breast   Hyperlipemia    Hypertension    past not on any medications currently   Personal history of colonic polyps    TUBULAR ADENOMA   Personal history of radiation therapy     Patient Active Problem List   Diagnosis Date Noted   Cataract 10/28/2021   Suspected COVID-19 virus infection 11/07/2019   Cough 12/08/2018   Tendonitis, Achilles, right 08/05/2017   Left knee pain 04/23/2015   Knee pain, left 04/11/2015   Popliteal pain 04/11/2015   Hypertension 10/17/2014   Acute pharyngitis 09/04/2014   Left shoulder pain 02/19/2014   Benign paroxysmal positional vertigo 09/29/2013   Hot flashes due to tamoxifen 08/16/2013   Hx of vaginitis 04/15/2012   De Quervain's disease (tenosynovitis) 04/05/2012   Malignant neoplasm of upper-outer quadrant of left breast in female, estrogen receptor positive (HCC)    LUQ abdominal pain 12/16/2010   IBS 12/03/2010   ELEVATED BP READING WITHOUT DX HYPERTENSION 12/03/2010   NECK SPRAIN AND STRAIN 12/03/2010   ANXIETY STATE, UNSPECIFIED 11/07/2010   ADD 11/07/2010   GERD 11/07/2010    CHEST PAIN UNSPECIFIED 11/07/2010    Past Surgical History:  Procedure Laterality Date   ABDOMINAL HYSTERECTOMY     partial for DUB   BREAST LUMPECTOMY  07/23/2011   left breast   COLONOSCOPY     LAPAROSCOPIC HYSTERECTOMY  1995   POLYPECTOMY      OB History     Gravida  3   Para  3   Term      Preterm      AB      Living  3      SAB      IAB      Ectopic      Multiple      Live Births               Home Medications    Prior to Admission medications   Medication Sig Start Date End Date Taking? Authorizing Provider  pantoprazole (PROTONIX) 40 MG tablet TAKE 1 TABLET BY MOUTH EVERY DAY 05/03/23  Yes Zehr, Shanda Bumps D, PA-C  rosuvastatin (CRESTOR) 5 MG tablet Take 5 mg by mouth daily. 03/23/23  Yes [provider]  albuterol (PROVENTIL HFA;VENTOLIN HFA) 108 (90 Base) MCG/ACT inhaler Inhale 2 puffs into the lungs every 6 (six) hours as needed for wheezing or shortness of breath. 10/12/17   Noni Saupe, MD  Multiple  Vitamin (MULTI-VITAMIN PO) Take by mouth daily.    [provider]  valACYclovir (VALTREX) 500 MG tablet TAKE 1 TABLET BY MOUTH TWICE DAILY FOR 3-5 DAYS WITH OUTBREAK. 06/07/20   Lezlie Lye, Meda Coffee, MD    Family History Family History  Problem Relation Age of Onset   Breast cancer Mother    Cancer Mother        breast   Hyperlipidemia Mother    Gastric cancer Mother        Gallbladder cancer   Alzheimer's disease Mother    Diabetes Father    Hypertension Father    Cancer Father    Hyperlipidemia Father    Colon cancer Father    Gastric cancer Father    Cancer Brother        NEUROENDOCRINE   Hypertension Brother    Arthritis Brother        hips    Social History Social History   Tobacco Use   Smoking status: Former    Current packs/day: 0.00    Types: Cigarettes    Quit date: 1983    Years since quitting: 41.6   Smokeless tobacco: Never  Vaping Use   Vaping status: Never Used  Substance Use  Topics   Alcohol use: Not Currently    Comment: rare   Drug use: No     Allergies   Meloxicam, Miconazole nitrate, and Latex   Review of Systems Review of Systems  Constitutional:  Positive for chills. Negative for fever.  HENT:  Positive for congestion, sinus pressure and sore throat. Negative for ear pain.   Eyes:  Negative for discharge and redness.  Respiratory:  Positive for cough. Negative for shortness of breath and wheezing.   Gastrointestinal:  Negative for abdominal pain, diarrhea, nausea and vomiting.     Physical Exam Triage Vital Signs ED Triage Vitals  Encounter Vitals Group     BP      Systolic BP Percentile      Diastolic BP Percentile      Pulse      Resp      Temp      Temp src      SpO2      Weight      Height      Head Circumference      Peak Flow      Pain Score      Pain Loc      Pain Education      Exclude from Growth Chart    No data found.  Updated Vital Signs BP 125/84 (BP Location: Right Arm)   Pulse 75   Temp 98.7 F (37.1 C) (Oral)   Resp 18   Ht 5\' 2"  (1.575 m)   Wt 183 lb (83 kg)   SpO2 99%   BMI 33.47 kg/m   Physical Exam Vitals and nursing note reviewed.  Constitutional:      General: She is not in acute distress.    Appearance: Normal appearance. She is not ill-appearing.  HENT:     Head: Normocephalic and atraumatic.     Nose: Congestion present.     Mouth/Throat:     Mouth: Mucous membranes are moist.     Pharynx: No oropharyngeal exudate or posterior oropharyngeal erythema.  Eyes:     Conjunctiva/sclera: Conjunctivae normal.  Cardiovascular:     Rate and Rhythm: Normal rate and regular rhythm.     Heart sounds: Normal heart sounds. No murmur heard. Pulmonary:  Effort: Pulmonary effort is normal. No respiratory distress.     Breath sounds: Normal breath sounds. No wheezing, rhonchi or rales.  Skin:    General: Skin is warm and dry.  Neurological:     Mental Status: She is alert.  Psychiatric:         Mood and Affect: Mood normal.        Thought Content: Thought content normal.      UC Treatments / Results  Labs (all labs ordered are listed, but only abnormal results are displayed) Labs Reviewed  SARS CORONAVIRUS 2 (TAT 6-24 HRS)    EKG   Radiology No results found.  Procedures Procedures (including critical care time)  Medications Ordered in UC Medications - No data to display  Initial Impression / Assessment and Plan / UC Course  I have reviewed the triage vital signs and the nursing notes.  Pertinent labs & imaging results that were available during my care of the patient were reviewed by me and considered in my medical decision making (see chart for details).    COVID screening ordered.  Will await results for further recommendation.  Encouraged follow-up with any further concerns or persistent symptoms.  Final Clinical Impressions(s) / UC Diagnoses   Final diagnoses:  Exposure to COVID-19 virus   Discharge Instructions   None    ED Prescriptions   None    PDMP not reviewed this encounter.   Tomi Bamberger, PA-C 06/02/23 [phone]

## 2023-06-02 NOTE — ED Triage Notes (Signed)
"  My husband came yesterday and was diagnosed with COVID19 and my current symptoms are: stuffy nose, fatigue, chills, slight scratchy throat, cough at times". No fever known. Symptoms began on "Saturday".

## 2023-07-26 ENCOUNTER — Other Ambulatory Visit: Payer: Self-pay | Admitting: Gastroenterology

## 2023-08-17 ENCOUNTER — Other Ambulatory Visit: Payer: Self-pay

## 2023-08-17 ENCOUNTER — Ambulatory Visit
Admission: RE | Admit: 2023-08-17 | Discharge: 2023-08-17 | Disposition: A | Discharge status: Home / Self Care | Payer: Managed Care, Other (non HMO) | Source: Ambulatory Visit | Attending: Physician Assistant | Admitting: Physician Assistant

## 2023-08-17 VITALS — BP 127/87 | HR 92 | Temp 98.9°F | Resp 18

## 2023-08-17 DIAGNOSIS — J209 Acute bronchitis, unspecified: Secondary | ICD-10-CM | POA: Diagnosis not present

## 2023-08-17 MED ORDER — BENZONATATE 100 MG PO CAPS: 100.0000 mg | ORAL_CAPSULE | Freq: Three times a day (TID) | ORAL | 0 refills | Status: AC

## 2023-08-17 MED ORDER — ALBUTEROL SULFATE HFA 108 (90 BASE) MCG/ACT IN AERS: 2.0000 | INHALATION_SPRAY | Freq: Four times a day (QID) | RESPIRATORY_TRACT | 0 refills | Status: AC | PRN

## 2023-08-17 NOTE — ED Triage Notes (Signed)
Pt here for cough and congestion with fatigue worse at night x 4 days

## 2023-09-08 NOTE — ED Provider Notes (Signed)
[facility]    CSN: [id number] Arrival date & time: 08/17/23  1152      History   Chief Complaint Chief Complaint  Patient presents with   Cough    HPI April Robinson is a 62 y.o. female.   Patient here today for evaluation of cough and congestion she has had the last 4 days. She has had some wheezing. She denies any fever. She does not report sore throat. She has tried OTC meds without resolution.   The history is provided by the patient.  Cough Associated symptoms: wheezing   Associated symptoms: no chills, no ear pain, no eye discharge, no fever, no shortness of breath and no sore throat     Past Medical History:  Diagnosis Date   Alcoholism (HCC)    Arthritis    Blood transfusion without reported diagnosis    "40 years ago"   Cancer [facility])    breast L   Cataract    bilateral "starting"   Esophageal reflux    Hiatal hernia    Hx of radiation therapy 08/27/11 to 10/15/11   L breast   Hyperlipemia    Hypertension    past not on any medications currently   Personal history of colonic polyps    TUBULAR ADENOMA   Personal history of radiation therapy     Patient Active Problem List   Diagnosis Date Noted   Cataract 10/28/2021   Suspected COVID-19 virus infection 11/07/2019   Cough 12/08/2018   Tendonitis, Achilles, right 08/05/2017   Left knee pain 04/23/2015   Knee pain, left 04/11/2015   Popliteal pain 04/11/2015   Hypertension 10/17/2014   Acute pharyngitis 09/04/2014   Left shoulder pain 02/19/2014   Benign paroxysmal positional vertigo 09/29/2013   Hot flashes due to tamoxifen 08/16/2013   Hx of vaginitis 04/15/2012   De Quervain's disease (tenosynovitis) 04/05/2012   Malignant neoplasm of upper-outer quadrant of left breast in female, estrogen receptor positive (HCC)    LUQ abdominal pain 12/16/2010   IBS 12/03/2010   ELEVATED BP READING WITHOUT DX HYPERTENSION 12/03/2010   NECK SPRAIN AND STRAIN 12/03/2010   Anxiety state  11/07/2010   Attention deficit disorder 11/07/2010   GERD 11/07/2010   CHEST PAIN UNSPECIFIED 11/07/2010    Past Surgical History:  Procedure Laterality Date   ABDOMINAL HYSTERECTOMY     partial for DUB   BREAST LUMPECTOMY  07/23/2011   left breast   COLONOSCOPY     LAPAROSCOPIC HYSTERECTOMY  1995   POLYPECTOMY      OB History     Gravida  3   Para  3   Term      Preterm      AB      Living  3      SAB      IAB      Ectopic      Multiple      Live Births               Home Medications    Prior to Admission medications   Medication Sig Start Date End Date Taking? Authorizing Provider  benzonatate (TESSALON) 100 MG capsule Take 1 capsule (100 mg total) by mouth every 8 (eight) hours. 08/17/23  Yes Tomi Bamberger, PA-C  albuterol (VENTOLIN HFA) 108 (90 Base) MCG/ACT inhaler Inhale 2 puffs into the lungs every 6 (six) hours as needed for wheezing or shortness of breath. 08/17/23   Tomi Bamberger, PA-C  Multiple Vitamin (MULTI-VITAMIN PO) Take by mouth daily.    [provider]  pantoprazole (PROTONIX) 40 MG tablet TAKE 1 TABLET BY MOUTH EVERY DAY 05/03/23   Zehr, Shanda Bumps D, PA-C  rosuvastatin (CRESTOR) 5 MG tablet Take 5 mg by mouth daily. 03/23/23   [provider]  valACYclovir (VALTREX) 500 MG tablet TAKE 1 TABLET BY MOUTH TWICE DAILY FOR 3-5 DAYS WITH OUTBREAK. 06/07/20   Lezlie Lye, Meda Coffee, MD    Family History Family History  Problem Relation Age of Onset   Breast cancer Mother    Cancer Mother        breast   Hyperlipidemia Mother    Gastric cancer Mother        Gallbladder cancer   Alzheimer's disease Mother    Diabetes Father    Hypertension Father    Cancer Father    Hyperlipidemia Father    Colon cancer Father    Gastric cancer Father    Cancer Brother        NEUROENDOCRINE   Hypertension Brother    Arthritis Brother        hips    Social History Social History   Tobacco Use   Smoking status: Former     Current packs/day: 0.00    Types: Cigarettes    Quit date: 1983    Years since quitting: 41.8   Smokeless tobacco: Never  Vaping Use   Vaping status: Never Used  Substance Use Topics   Alcohol use: Not Currently    Comment: rare   Drug use: No     Allergies   Meloxicam, Miconazole nitrate, and Latex   Review of Systems Review of Systems  Constitutional:  Negative for chills and fever.  HENT:  Positive for congestion. Negative for ear pain and sore throat.   Eyes:  Negative for discharge and redness.  Respiratory:  Positive for cough and wheezing. Negative for shortness of breath.   Gastrointestinal:  Negative for abdominal pain, diarrhea, nausea and vomiting.     Physical Exam Triage Vital Signs ED Triage Vitals [08/17/23 1255]  Encounter Vitals Group     BP 127/87     Systolic BP Percentile      Diastolic BP Percentile      Pulse Rate 92     Resp 18     Temp 98.9 F (37.2 C)     Temp Source Oral     SpO2 96 %     Weight      Height      Head Circumference      Peak Flow      Pain Score 3     Pain Loc      Pain Education      Exclude from Growth Chart    No data found.  Updated Vital Signs BP 127/87 (BP Location: Right Arm)   Pulse 92   Temp 98.9 F (37.2 C) (Oral)   Resp 18   SpO2 96%   Visual Acuity Right Eye Distance:   Left Eye Distance:   Bilateral Distance:    Right Eye Near:   Left Eye Near:    Bilateral Near:     Physical Exam Vitals and nursing note reviewed.  Constitutional:      General: She is not in acute distress.    Appearance: Normal appearance. She is not ill-appearing.  HENT:     Head: Normocephalic and atraumatic.     Nose: Congestion present.  Mouth/Throat:     Mouth: Mucous membranes are moist.     Pharynx: No oropharyngeal exudate or posterior oropharyngeal erythema.  Eyes:     Conjunctiva/sclera: Conjunctivae normal.  Cardiovascular:     Rate and Rhythm: Normal rate and regular rhythm.     Heart sounds:  Normal heart sounds. No murmur heard. Pulmonary:     Effort: Pulmonary effort is normal. No respiratory distress.     Breath sounds: No rhonchi or rales. Wheezes: rare scattered. Skin:    General: Skin is warm and dry.  Neurological:     Mental Status: She is alert.  Psychiatric:        Mood and Affect: Mood normal.        Thought Content: Thought content normal.      UC Treatments / Results  Labs (all labs ordered are listed, but only abnormal results are displayed) Labs Reviewed - No data to display  EKG   Radiology No results found.  Procedures Procedures (including critical care time)  Medications Ordered in UC Medications - No data to display  Initial Impression / Assessment and Plan / UC Course  I have reviewed the triage vital signs and the nursing notes.  Pertinent labs & imaging results that were available during my care of the patient were reviewed by me and considered in my medical decision making (see chart for details).    Will treat to cover bronchitis with albuterol inhaler and tessalon pearles. Steroid burst deferred at this time. Encouraged follow up if no gradual improvement or with any further concerns. Patient expresses understanding.   Final Clinical Impressions(s) / UC Diagnoses   Final diagnoses:  Acute bronchitis, unspecified organism   Discharge Instructions   None    ED Prescriptions     Medication Sig Dispense Auth. Provider   albuterol (VENTOLIN HFA) 108 (90 Base) MCG/ACT inhaler Inhale 2 puffs into the lungs every 6 (six) hours as needed for wheezing or shortness of breath. 1 each Tomi Bamberger, PA-C   benzonatate (TESSALON) 100 MG capsule Take 1 capsule (100 mg total) by mouth every 8 (eight) hours. 21 capsule Tomi Bamberger, PA-C      PDMP not reviewed this encounter.   Tomi Bamberger, PA-C 09/08/23 2006

## 2024-04-10 ENCOUNTER — Other Ambulatory Visit: Payer: Self-pay | Admitting: Obstetrics and Gynecology

## 2024-04-10 DIAGNOSIS — Z1231 Encounter for screening mammogram for malignant neoplasm of breast: Secondary | ICD-10-CM

## 2024-05-25 ENCOUNTER — Ambulatory Visit
Admission: RE | Admit: 2024-05-25 | Discharge: 2024-05-25 | Disposition: A | Discharge status: Home / Self Care | Source: Ambulatory Visit | Attending: Obstetrics and Gynecology | Admitting: Obstetrics and Gynecology

## 2024-05-25 DIAGNOSIS — Z1231 Encounter for screening mammogram for malignant neoplasm of breast: Secondary | ICD-10-CM

## 2024-05-29 ENCOUNTER — Ambulatory Visit: Payer: Self-pay | Admitting: Obstetrics and Gynecology
